# Patient Record
Sex: Female | Born: 1960 | Race: White | Hispanic: No | Marital: Married | Smoking: Never smoker
Health system: Southern US, Community
[De-identification: ages and names within clinical notes are randomized; demographics above are authoritative.]

## PROBLEM LIST (undated history)

## (undated) DIAGNOSIS — C50919 Malignant neoplasm of unspecified site of unspecified female breast: Secondary | ICD-10-CM

## (undated) DIAGNOSIS — J302 Other seasonal allergic rhinitis: Secondary | ICD-10-CM

## (undated) DIAGNOSIS — R112 Nausea with vomiting, unspecified: Secondary | ICD-10-CM

## (undated) DIAGNOSIS — J45909 Unspecified asthma, uncomplicated: Secondary | ICD-10-CM

## (undated) DIAGNOSIS — T7840XA Allergy, unspecified, initial encounter: Secondary | ICD-10-CM

## (undated) DIAGNOSIS — Z9889 Other specified postprocedural states: Secondary | ICD-10-CM

## (undated) DIAGNOSIS — Z923 Personal history of irradiation: Secondary | ICD-10-CM

## (undated) HISTORY — DX: Malignant neoplasm of unspecified site of unspecified female breast: C50.919

---

## 1989-12-30 HISTORY — PX: LAPAROSCOPIC ENDOMETRIOSIS FULGURATION: SUR769

## 1990-12-30 HISTORY — PX: ABDOMINAL HYSTERECTOMY: SHX81

## 2013-10-01 ENCOUNTER — Other Ambulatory Visit: Payer: Self-pay | Admitting: Radiology

## 2013-10-01 DIAGNOSIS — C50911 Malignant neoplasm of unspecified site of right female breast: Secondary | ICD-10-CM

## 2013-10-05 ENCOUNTER — Telehealth: Payer: Self-pay | Admitting: *Deleted

## 2013-10-05 DIAGNOSIS — C50311 Malignant neoplasm of lower-inner quadrant of right female breast: Secondary | ICD-10-CM | POA: Insufficient documentation

## 2013-10-05 NOTE — Telephone Encounter (Signed)
Confirmed [facility] for 10/06/13 at 1200 .  Instructions and contact information given.

## 2013-10-06 ENCOUNTER — Encounter: Payer: Self-pay | Admitting: Oncology

## 2013-10-06 ENCOUNTER — Ambulatory Visit: Payer: BC Managed Care – PPO

## 2013-10-06 ENCOUNTER — Ambulatory Visit
Admission: RE | Admit: 2013-10-06 | Discharge: 2013-10-06 | Disposition: A | Discharge status: Home / Self Care | Payer: BC Managed Care – PPO | Source: Ambulatory Visit | Attending: Radiation Oncology | Admitting: Radiation Oncology

## 2013-10-06 ENCOUNTER — Encounter: Payer: Self-pay | Admitting: *Deleted

## 2013-10-06 ENCOUNTER — Ambulatory Visit (HOSPITAL_BASED_OUTPATIENT_CLINIC_OR_DEPARTMENT_OTHER): Payer: BC Managed Care – PPO | Admitting: General Surgery

## 2013-10-06 ENCOUNTER — Other Ambulatory Visit (HOSPITAL_BASED_OUTPATIENT_CLINIC_OR_DEPARTMENT_OTHER): Payer: BC Managed Care – PPO | Admitting: Lab

## 2013-10-06 ENCOUNTER — Telehealth: Payer: Self-pay | Admitting: Oncology

## 2013-10-06 ENCOUNTER — Ambulatory Visit (HOSPITAL_BASED_OUTPATIENT_CLINIC_OR_DEPARTMENT_OTHER): Payer: BC Managed Care – PPO | Admitting: Oncology

## 2013-10-06 ENCOUNTER — Ambulatory Visit: Payer: BC Managed Care – PPO | Attending: General Surgery | Admitting: Physical Therapy

## 2013-10-06 ENCOUNTER — Encounter (INDEPENDENT_AMBULATORY_CARE_PROVIDER_SITE_OTHER): Payer: Self-pay | Admitting: General Surgery

## 2013-10-06 VITALS — BP 127/83 | HR 75 | Temp 97.8°F | Resp 19 | Wt 186.6 lb

## 2013-10-06 DIAGNOSIS — C50311 Malignant neoplasm of lower-inner quadrant of right female breast: Secondary | ICD-10-CM

## 2013-10-06 DIAGNOSIS — C50319 Malignant neoplasm of lower-inner quadrant of unspecified female breast: Secondary | ICD-10-CM

## 2013-10-06 DIAGNOSIS — IMO0001 Reserved for inherently not codable concepts without codable children: Secondary | ICD-10-CM | POA: Insufficient documentation

## 2013-10-06 DIAGNOSIS — C50919 Malignant neoplasm of unspecified site of unspecified female breast: Secondary | ICD-10-CM | POA: Insufficient documentation

## 2013-10-06 DIAGNOSIS — Z803 Family history of malignant neoplasm of breast: Secondary | ICD-10-CM

## 2013-10-06 DIAGNOSIS — R293 Abnormal posture: Secondary | ICD-10-CM | POA: Insufficient documentation

## 2013-10-06 DIAGNOSIS — Z171 Estrogen receptor negative status [ER-]: Secondary | ICD-10-CM

## 2013-10-06 LAB — COMPREHENSIVE METABOLIC PANEL (CC13)
ALT: 19 U/L (ref 0–55)
AST: 17 U/L (ref 5–34)
Albumin: 4.2 g/dL (ref 3.5–5.0)
Anion Gap: 8 mEq/L (ref 3–11)
BUN: 12 mg/dL (ref 7.0–26.0)
CO2: 28 mEq/L (ref 22–29)
Calcium: 9.7 mg/dL (ref 8.4–10.4)
Chloride: 105 mEq/L (ref 98–109)
Creatinine: 0.8 mg/dL (ref 0.6–1.1)
Potassium: 4.2 mEq/L (ref 3.5–5.1)
Total Bilirubin: 0.56 mg/dL (ref 0.20–1.20)
Total Protein: 7.3 g/dL (ref 6.4–8.3)

## 2013-10-06 LAB — CBC WITH DIFFERENTIAL/PLATELET
BASO%: 0.9 % (ref 0.0–2.0)
Basophils Absolute: 0 10*3/uL (ref 0.0–0.1)
Eosinophils Absolute: 0.1 10*3/uL (ref 0.0–0.5)
HCT: 41.2 % (ref 34.8–46.6)
HGB: 14.2 g/dL (ref 11.6–15.9)
LYMPH%: 27.1 % (ref 14.0–49.7)
MCHC: 34.4 g/dL (ref 31.5–36.0)
MONO#: 0.4 10*3/uL (ref 0.1–0.9)
NEUT#: 3 10*3/uL (ref 1.5–6.5)
NEUT%: 63 % (ref 38.4–76.8)
WBC: 4.8 10*3/uL (ref 3.9–10.3)
lymph#: 1.3 10*3/uL (ref 0.9–3.3)

## 2013-10-06 NOTE — Patient Instructions (Signed)
You have a relatively large tumor in the right breast at the medial, 3:00 position. Your lymph nodes looked normal.  You have an MRI scheduled for October 12. We will review that once it is completed  We have talked about different treatment plans, and you have stated that you would like to try to down stage the tumor with preoperative chemotherapy so that we can hopefully do a lumpectomy and sentinel node biopsy in the future.  Dr. Jacinto Halim office will call you to schedule Port-A-Cath insertion in the near future.  Dr. Welton Flakes will schedule you for genetic counseling as well as other scans.        Implanted Port Instructions An implanted port is a central line that has a round shape and is placed under the skin. It is used for long-term IV (intravenous) access for:  Medicine.  Fluids.  Liquid nutrition, such as TPN (total parenteral nutrition).  Blood samples. Ports can be placed:  In the chest area just below the collarbone (this is the most common place.)  In the arms.  In the belly (abdomen) area.  In the legs. PARTS OF THE PORT A port has 2 main parts:  The reservoir. The reservoir is round, disc-shaped, and will be a small, raised area under your skin.  The reservoir is the part where a needle is inserted (accessed) to either give medicines or to draw blood.  The catheter. The catheter is a long, slender tube that extends from the reservoir. The catheter is placed into a large vein.  Medicine that is inserted into the reservoir goes into the catheter and then into the vein. INSERTION OF THE PORT  The port is surgically placed in either an operating room or in a procedural area (interventional radiology).  Medicine may be given to help you relax during the procedure.  The skin where the port will be inserted is numbed (local anesthetic).  1 or 2 small cuts (incisions) will be made in the skin to insert the port.  The port can be used after it has been  inserted. INCISION SITE CARE  The incision site may have small adhesive strips on it. This helps keep the incision site closed. Sometimes, no adhesive strips are placed. Instead of adhesive strips, a special kind of surgical glue is used to keep the incision closed.  If adhesive strips were placed on the incision sites, do not take them off. They will fall off on their own.  The incision site may be sore for 1 to 2 days. Pain medicine can help.  Do not get the incision site wet. Bathe or shower as directed by your caregiver.  The incision site should heal in 5 to 7 days. A small scar may form after the incision has healed. ACCESSING THE PORT Special steps must be taken to access the port:  Before the port is accessed, a numbing cream can be placed on the skin. This helps numb the skin over the port site.  A sterile technique is used to access the port.  The port is accessed with a needle. Only "non-coring" port needles should be used to access the port. Once the port is accessed, a blood return should be checked. This helps ensure the port is in the vein and is not clogged (clotted).  If your caregiver believes your port should remain accessed, a clear (transparent) bandage will be placed over the needle site. The bandage and needle will need to be changed every week or as  directed by your caregiver.  Keep the bandage covering the needle clean and dry. Do not get it wet. Follow your caregiver's instructions on how to take a shower or bath when the port is accessed.  If your port does not need to stay accessed, no bandage is needed over the port. FLUSHING THE PORT Flushing the port keeps it from getting clogged. How often the port is flushed depends on:  If a constant infusion is running. If a constant infusion is running, the port may not need to be flushed.  If intermittent medicines are given.  If the port is not being used. For intermittent medicines:  The port will need to be  flushed:  After medicines have been given.  After blood has been drawn.  As part of routine maintenance.  A port is normally flushed with:  Normal saline.  Heparin.  Follow your caregiver's advice on how often, how much, and the type of flush to use on your port. IMPORTANT PORT INFORMATION  Tell your caregiver if you are allergic to heparin.  After your port is placed, you will get a manufacturer's information card. The card has information about your port. Keep this card with you at all times.  There are many types of ports available. Know what kind of port you have.  In case of an emergency, it may be helpful to wear a medical alert bracelet. This can help alert health care workers that you have a port.  The port can stay in for as long as your caregiver believes it is necessary.  When it is time for the port to come out, surgery will be done to remove it. The surgery will be similar to how the port was put in.  If you are in the hospital or clinic:  Your port will be taken care of and flushed by a nurse.  If you are at home:  A home health care nurse may give medicines and take care of the port.  You or a family member can get special training and directions for giving medicine and taking care of the port at home. SEEK IMMEDIATE MEDICAL CARE IF:   Your port does not flush or you are unable to get a blood return.  New drainage or pus is coming from the incision.  A bad smell is coming from the incision site.  You develop swelling or increased redness at the incision site.  You develop increased swelling or pain at the port site.  You develop swelling or pain in the surrounding skin near the port.  You have an oral temperature above 102 F (38.9 C), not controlled by medicine. MAKE SURE YOU:   Understand these instructions.  Will watch your condition.  Will get help right away if you are not doing well or get worse. Document Released: 12/16/2005 Document  Revised: 03/09/2012 Document Reviewed: 03/09/2009 [facility] Patient Information 2014 [place], [place].

## 2013-10-06 NOTE — Progress Notes (Signed)
Checked in new pt with no financial concerns. °

## 2013-10-06 NOTE — Progress Notes (Addendum)
Patient ID: Brittan Butterbaugh, female   DOB: 12-08-1961, 52 y.o.   MRN: [id number]  No chief complaint on file.   HPI Raivyn Kabler is a 52 y.o. female.  She is referred by Dr. 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 Dr. Marinda Elk is her primary care physician in [facility]. She is being evaluated in the [facility] today by Dr. Welton Flakes, Dr. Mitzi Hansen, and me.  She noticed a lump in her right breast about 2 months ago. She states her last mammogram was in December 2013 and nothing was seen. Mammograms showed a 3.5 cm suspicious mass in the right breast medially. Image guided biopsy shows invasive mammary carcinoma, probably invasive ductal carcinoma. This is afebrile negative breast cancer.  MRI is scheduled for October 12  She is interested in  breast conservation, if possible  Past history reveals that she has had abdominal hysterectomy and one ovary was removed. She had a history of endometriosis. Family history reveals breast cancer in a maternal grandmother and a paternal aunt but no ovarian cancer. She is here today with her husband. They have 2 grown children. She works in Insurance account manager  for [employer]  HPI  Past Medical History  Diagnosis Date  . Breast cancer     Past Surgical History  Procedure Laterality Date  . Abdominal hysterectomy      Family History  Problem Relation Age of Onset  . Lung cancer Maternal Uncle   . Breast cancer Paternal Aunt   . Breast cancer Maternal Grandmother   . Lung cancer Maternal Uncle     Social History History  Substance Use Topics  . Smoking status: Never Smoker   . Smokeless tobacco: Not on file  . Alcohol Use: Yes    Allergies  Allergen Reactions  . Penicillins     Current Outpatient Prescriptions  Medication Sig Dispense Refill  . aspirin 81 MG tablet Take 81 mg by mouth daily.      Marland Kitchen ibuprofen (ADVIL,MOTRIN) 200 MG tablet Take 200 mg by  mouth every 6 (six) hours as needed for pain.       No current facility-administered medications for this visit.    Review of Systems Review of Systems  Constitutional: Negative for fever, chills and unexpected weight change.  HENT: Negative for congestion, hearing loss, sore throat, trouble swallowing and voice change.   Eyes: Negative for visual disturbance.  Respiratory: Negative for cough and wheezing.   Cardiovascular: Negative for chest pain, palpitations and leg swelling.  Gastrointestinal: Negative for nausea, vomiting, abdominal pain, diarrhea, constipation, blood in stool, abdominal distention and anal bleeding.  Genitourinary: Negative for hematuria, vaginal bleeding and difficulty urinating.  Musculoskeletal: Negative for arthralgias.  Skin: Negative for rash and wound.  Neurological: Negative for seizures, syncope and headaches.  Hematological: Negative for adenopathy. Does not bruise/bleed easily.  Psychiatric/Behavioral: Negative for confusion.    There were no vitals taken for this visit.  Physical Exam Physical Exam  Constitutional: She is oriented to person, place, and time. She appears well-developed and well-nourished. No distress.  HENT:  Head: Normocephalic and atraumatic.  Nose: Nose normal.  Mouth/Throat: No oropharyngeal exudate.  Eyes: Conjunctivae and EOM are normal. Pupils are equal, round, and reactive to light. Left eye exhibits no discharge. No scleral icterus.  Neck: Neck supple. No JVD present. No tracheal deviation present. No thyromegaly present.  Cardiovascular: Normal rate, regular rhythm, normal  heart sounds and intact distal pulses.   No murmur heard. Pulmonary/Chest: Effort normal and breath sounds normal. No respiratory distress. She has no wheezes. She has no rales. She exhibits no tenderness.    Large mass, 3.5-4 cm, right breast, at approximately 2:30 to 3:00 position beginning at the  medial edge of the areola and extending medially.  Skin is intact. This does not appear to invade the skin. No axillary adenopathy. Left breast and axilla are normal to exam.  Abdominal: Soft. Bowel sounds are normal. She exhibits no distension and no mass. There is no tenderness. There is no rebound and no guarding.  Musculoskeletal: She exhibits no edema and no tenderness.  Lymphadenopathy:    She has no cervical adenopathy.  Neurological: She is alert and oriented to person, place, and time. She exhibits normal muscle tone. Coordination normal.  Skin: Skin is warm. No rash noted. She is not diaphoretic. No erythema. No pallor.  Psychiatric: She has a normal mood and affect. Her behavior is normal. Judgment and thought content normal.    Data Reviewed I have reviewed her imaging studies and histology in breast conference this morning. I have coordinated her treatment plan with Dr. Welton Flakes and Dr. 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 Clinical stage T2, N0   Patient strongly desires breast conservation surgery, and there's a she will need to undergo port preop neoadjuvant chemotherapy if she is to attempt to achieve this. She is comfortable with that  MRI pending    Plan    After lengthy discussion with Dr. Lennette Bihari, Dr. Mitzi Hansen, and me, she agrees to neoadjuvant chemotherapy  She will be scheduled for Port-A-Cath insertion. I discussed the indications, details, techniques, and numerous risk of port insertion with her. She is aware of the risk of bleeding, infection, catheter embolization, pneumothorax, catheter occlusion, and other unforeseen problems. She understands these issues and all her questions are answered.  She agrees with this plan.  Dr. Welton Flakes will refer her for genetic counseling  Dr. Welton Flakes will arrange for PET/CT and other imaging studies  MRI is scheduled for 10/10/2013. We'll review that as soon as the results are in.  She knows this may find other areas that may need biopsy.  ADDENDUM:  MRI shows 3.9 cm mass right breast medially. Solitary finding. No adenopathy.       Angelia Mould. Derrell Lolling, M.D., [facility] General and Minimally invasive Surgery Breast and Colorectal Surgery Office:   [phone] Pager:   [phone]  10/06/2013, 4:00 PM

## 2013-10-06 NOTE — Progress Notes (Signed)
Mekhia Brogan [id number] Feb 23, 1961 52 y.o. 10/06/2013 2:19 PM  CC  [employer], PA-C [address] [place] [address] Dr. Claud Kelp Dr. Dorothy Puffer  REASON FOR CONSULTATION:  52 year old female with new diagnosis of right breast cancer. Patient is seen in the multidisciplinary breast clinic for discussion of treatment options  STAGE:   Breast cancer of lower-inner quadrant of right female breast   Primary site: Breast (Right)   Staging method: AJCC 7th Edition   Clinical: Stage IIA (T2, N0, cM0)   Summary: Stage IIA (T2, N0, cM0)  REFERRING PHYSICIAN: Dr. Claud Kelp  HISTORY OF PRESENT ILLNESS:  Chazlyn Cude is a 51 y.o. female.  Without significant past medical history. Patient felt a mass in the right breast. Her last normal mammogram was in December 2000 third team. In September 2014 patient felt a mass in the right breast. She is to her primary care physician's attention. She had a mammogram performed that did reveal a suspicious mass at the 4:00 position was irregular and spiculated. On ultrasound measured 3.5 cm irregular with an indistinct margins in the right breast at 4:00 anterior depth. No other significant masses calcifications or other findings were noted in either breast her MRI is pending. Patient had a biopsy performed on 09/30/2013. 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 The tumor was estrogen receptor negative progesterone receptor negative negative with a Ki-67 80%. Her case was discussed at the multidisciplinary breast conference and she is now seen in the multidisciplinary breast clinic for discussion of treatment options. She is also seen by Dr. Claud Kelp Dr. Dorothy Puffer. She does have an MRI scheduled for Sunday, October 12. Other than pain she has no other complaints. She is accompanied by her husband.   Past Medical History: Past Medical History  Diagnosis Date  . Breast  cancer     Past Surgical History: Past Surgical History  Procedure Laterality Date  . Abdominal hysterectomy      Family History: Family History  Problem Relation Age of Onset  . Lung cancer Maternal Uncle   . Breast cancer Paternal Aunt   . Breast cancer Maternal Grandmother   . Lung cancer Maternal Uncle     Social History History  Substance Use Topics  . Smoking status: Never Smoker   . Smokeless tobacco: Not on file  . Alcohol Use: Yes    Allergies: Allergies  Allergen Reactions  . Penicillins     Current Medications: Current Outpatient Prescriptions  Medication Sig Dispense Refill  . aspirin 81 MG tablet Take 81 mg by mouth daily.      Marland Kitchen ibuprofen (ADVIL,MOTRIN) 200 MG tablet Take 200 mg by mouth every 6 (six) hours as needed for pain.       No current facility-administered medications for this visit.    OB/GYN History: Menarche at age 3 patient underwent surgical menopause in 1992 she has been on hormone replacement therapy she will now discontinue this. She has been using patches for 7 years. First live birth was at 48.  Fertility Discussion: Not applicable Prior History of Cancer: No  Health Maintenance:  Colonoscopy yes 2014 Bone Density 2012 Last PAP smear 2003rd team  ECOG PERFORMANCE STATUS: 0 - Asymptomatic  Genetic Counseling/testing: Patient will be referred to genetic counseling and testing for triple-negative disease  REVIEW OF SYSTEMS:  A comprehensive review of systems was negative.  PHYSICAL EXAMINATION: Blood pressure 127/83, pulse 75, temperature 97.8 F (36.6 C),  temperature source Oral, resp. rate 19, weight 186 lb 9.6 oz (84.641 kg).  ZOX:WRUEA, healthy, no distress, well nourished and well developed SKIN: skin color, texture, turgor are normal HEAD: Normocephalic EYES: PERRLA, EOMI, Conjunctiva are pink and non-injected EARS: External ears normal OROPHARYNX:no exudate, no erythema and lips, buccal mucosa, and tongue  normal  NECK: supple, no adenopathy, thyroid normal size, non-tender, without nodularity LYMPH:  no palpable lymphadenopathy, no hepatosplenomegaly BREAST:left breast normal without mass, skin or nipple changes or axillary nodes, abnormal mass palpable at the 4:00 position there is no palpable lymphadenopathy LUNGS: clear to auscultation and percussion HEART: regular rate & rhythm ABDOMEN:abdomen soft, non-tender, normal bowel sounds and no masses or organomegaly BACK: Back symmetric, no curvature., No CVA tenderness EXTREMITIES:no edema, no clubbing, no cyanosis  NEURO: alert & oriented x 3 with fluent speech, no focal motor/sensory deficits, gait normal, reflexes normal and symmetric     STUDIES/RESULTS: No results found.   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 The mass measures 3.5 cm by ultrasound. MRI is pending. Be prognostic markers revealed the tumor to be estrogen receptor negative progesterone receptor negative HER-2/neu negative with an elevated proliferation marker Ki-67 of 80%. Based on patient's size she has a stage II.  #2 patient and I discussed her pathology radiology and we discussed the physiology of breast cancer. Also discussed treatment options for triple negative breast cancer. This certainly would include surgery chemotherapy radiation therapy. We discussed role  of adjuvant neoadjuvant chemotherapy. I do think that the patient's tumor is beginning off that she would be a good candidate for preop chemotherapy and she does require breast conservation. I would recommend Adriamycin and Cytoxan given dose dense 4 cycles with [name] support. This was then followed Taxol carboplatinum weekly for 12 weeks. We discussed side effects risks benefits and rationale of chemotherapy.  #3 if patient does successfully undergo lumpectomy she will need radiation therapy and she was seen by Dr. Dorothy Puffer.  #5 because patient has a family history and is triple negative we are referring her to genetic testing.  #6 too high risk nature of her disease I have recommended initial staging studies including CT.  #7 chemotherapy patient will need a Port-A-Cath placed chemotherapy class echocardiogram. My hope is to start her on neoadjuvant treatment in the next few weeks once is placed.  Clinical Trial Eligibility: no Multidisciplinary conference discussion yes     PLAN:    #1 patient will proceed with MRI of the breasts.  #2 she will be seen by Dr. Derrell Lolling for placement of Port-A-Cath.  #3 we will set her up for genetic testing, chemotherapy class, echocardiogram, staging PET/CT,.  #4 hopefully can start her on chemotherapy at  the end of this month.       Discussion: Patient is being treated per NCCN breast cancer care guidelines appropriate for stage.II   Thank you so much for allowing me to participate in the care of [facility]. I will continue to follow up the patient with you and assist in her care.  All questions were answered. The patient knows to call the clinic with any problems, questions or concerns. We can certainly see the patient much sooner if necessary.  I spent 55 minutes counseling the patient face to face. The total time spent in the appointment was 60 minutes.  Drue Second, MD Medical/Oncology [facility] [phone]  (beeper) [phone] (Office)  10/06/2013, 2:19 PM

## 2013-10-07 ENCOUNTER — Telehealth: Payer: Self-pay | Admitting: Oncology

## 2013-10-07 ENCOUNTER — Encounter: Payer: Self-pay | Admitting: Specialist

## 2013-10-07 NOTE — Progress Notes (Signed)
I met the patient ane her husband at breast clinic.  She rated her distress as "5".  I encouraged her to attend Breast Cancer Support Group, and she did ask that I make a referral for her to the [facility].  I gave her my contact information should she need more assistance.

## 2013-10-07 NOTE — Telephone Encounter (Signed)
, °

## 2013-10-08 ENCOUNTER — Encounter (HOSPITAL_COMMUNITY): Payer: Self-pay | Admitting: Pharmacy Technician

## 2013-10-08 ENCOUNTER — Other Ambulatory Visit: Payer: Self-pay

## 2013-10-10 ENCOUNTER — Ambulatory Visit
Admission: RE | Admit: 2013-10-10 | Discharge: 2013-10-10 | Disposition: A | Discharge status: Home / Self Care | Payer: BC Managed Care – PPO | Source: Ambulatory Visit | Attending: Radiology | Admitting: Radiology

## 2013-10-10 DIAGNOSIS — C50911 Malignant neoplasm of unspecified site of right female breast: Secondary | ICD-10-CM

## 2013-10-10 MED ORDER — GADOBENATE DIMEGLUMINE 529 MG/ML IV SOLN
18.0000 mL | Freq: Once | INTRAVENOUS | Status: AC | PRN
  Administered 2013-10-10: 18 mL via INTRAVENOUS

## 2013-10-11 ENCOUNTER — Ambulatory Visit (HOSPITAL_COMMUNITY)
Admission: RE | Admit: 2013-10-11 | Discharge: 2013-10-11 | Disposition: A | Discharge status: Home / Self Care | Payer: BC Managed Care – PPO | Source: Ambulatory Visit | Attending: Oncology | Admitting: Oncology

## 2013-10-11 ENCOUNTER — Other Ambulatory Visit: Payer: BC Managed Care – PPO

## 2013-10-11 DIAGNOSIS — C50311 Malignant neoplasm of lower-inner quadrant of right female breast: Secondary | ICD-10-CM

## 2013-10-11 DIAGNOSIS — C50319 Malignant neoplasm of lower-inner quadrant of unspecified female breast: Secondary | ICD-10-CM | POA: Insufficient documentation

## 2013-10-11 DIAGNOSIS — Z01818 Encounter for other preprocedural examination: Secondary | ICD-10-CM | POA: Insufficient documentation

## 2013-10-11 DIAGNOSIS — Z0189 Encounter for other specified special examinations: Secondary | ICD-10-CM

## 2013-10-11 NOTE — Progress Notes (Signed)
Echocardiogram 2D Echocardiogram has been performed.  Natalie Arias 10/11/2013, 9:20 AM

## 2013-10-13 ENCOUNTER — Telehealth: Payer: Self-pay | Admitting: *Deleted

## 2013-10-13 ENCOUNTER — Encounter (HOSPITAL_COMMUNITY): Payer: Self-pay | Admitting: Pharmacy Technician

## 2013-10-13 NOTE — Progress Notes (Signed)
Radiation Oncology         [phone] ________________________________  Name: Natalie Arias MRN: [id number]  Date: 10/06/2013  DOB: [birth year]  WG:[id number],YQMV, PA-C  Ernestene Mention, MD   Drue Second, M.D.  REFERRING PHYSICIAN: Ernestene Mention, MD   DIAGNOSIS: The encounter diagnosis was Breast cancer of lower-inner quadrant of right female breast.   HISTORY OF PRESENT ILLNESS::Natalie Arias is a 52 y.o. female who is seen for an initial consultation visit. The patient had a palpable abnormality which he noticed within the right breast. A mammogram was completed in December of 2013 which was negative. She was set up by an additional mammogram more recently by her primary care physician. This showed a 3.5 cm suspicious mass within the right breast medially. 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 Receptor studies have been completed and the tumor is ER negative, PR negative, and HER-2/neu negative. The Ki-67 staining was 81st.  The patient's case was discussed in multidisciplinary breast conference this morning and she is seen in clinic this afternoon. The patient has an MRI scan which is pending.   PREVIOUS RADIATION THERAPY: No   PAST MEDICAL HISTORY:  has a past medical history of Breast cancer.     PAST SURGICAL HISTORY: Past Surgical History  Procedure Laterality Date  . Abdominal hysterectomy       FAMILY HISTORY: family history includes Breast cancer in her maternal grandmother and paternal aunt; Lung cancer in her maternal uncle and maternal uncle.   SOCIAL HISTORY:  reports that she has never smoked. She does not have any smokeless tobacco history on file. She reports that she drinks alcohol. She reports that she does not use illicit drugs.   ALLERGIES: Penicillins   MEDICATIONS:  Current Outpatient Prescriptions  Medication Sig Dispense Refill  . aspirin 81 MG  tablet Take 81 mg by mouth every morning.       Marland Kitchen ibuprofen (ADVIL,MOTRIN) 200 MG tablet Take 200 mg by mouth every 6 (six) hours as needed for pain.       No current facility-administered medications for this encounter.     REVIEW OF SYSTEMS:  A 15 point review of systems is documented in the electronic medical record. This was obtained by the nursing staff. However, I reviewed this with the patient to discuss relevant findings and make appropriate changes.  Pertinent items are noted in HPI.    PHYSICAL EXAM:  vitals were not taken for this visit.  General: Well-developed, in no acute distress HEENT: Normocephalic, atraumatic; oral cavity clear Neck: Supple without any lymphadenopathy Cardiovascular: Regular rate and rhythm Respiratory: Clear to auscultation bilaterally Breasts: For an approximate 3.5 cm mass is present medially within the right breast. This begins at the medial edge of the nipple area or complex. The skin is intact with no significant related changes. No axillary adenopathy. Left breast exam and axillary exam are unremarkable GI: Soft, nontender, normal bowel sounds Extremities: No edema present Neuro: No focal deficits     LABORATORY DATA:  Lab Results  Component Value Date   WBC 4.8 10/06/2013   HGB 14.2 10/06/2013   HCT 41.2 10/06/2013   MCV 87.4 10/06/2013   PLT 168 10/06/2013   Lab Results  Component Value Date   NA 142 10/06/2013   K 4.2 10/06/2013   CO2 28 10/06/2013   Lab Results  Component Value Date   ALT 19 10/06/2013  AST 17 10/06/2013   ALKPHOS 70 10/06/2013   BILITOT 0.56 10/06/2013      RADIOGRAPHY: Mr Breast Bilateral W Wo Contrast  10/11/2013   CLINICAL DATA:  Recently diagnosed right breast invasive mammary carcinoma. Preoperative evaluation.  EXAM: MR BILATERAL BREAST WITHOUT AND WITH CONTRAST  LABS:  None  TECHNIQUE: Multiplanar, multisequence MR images of both breasts were obtained prior to and following the intravenous administration of  18ml of MultiHance.  THREE-DIMENSIONAL MR IMAGE RENDERING ON INDEPENDENT WORKSTATION:  Three-dimensional MR images were rendered by post-processing of the original MR data on an independent workstation. The three-dimensional MR images were interpreted, and findings are reported in the following complete MRI report for this study.  COMPARISON:  Previous mammograms dated 09/30/2013, 09/29/2013.  FINDINGS: Breast composition: c:  Heterogeneous fibroglandular tissue  Background parenchymal enhancement: Moderate  Right breast: Within the right breast and there is an irregularly marginated enhancing mass with central necrosis located within the upper inner quadrant of the right breast at the 2 o'clock position (the middle 1/3). This measures 3.9 x 3.7 x 3.1 cm in size and is associated with clip artifact. There are no additional areas of worrisome enhancement within the right breast. .  Left breast: No mass or abnormal enhancement.  Lymph nodes: No abnormal appearing lymph nodes.  Ancillary findings:  None.  IMPRESSION: 3.9 cm irregular enhancing mass located within the upper inner quadrant of the right breast corresponding to the recently diagnosed invasive mammary carcinoma. No evidence for adenopathy and no additional findings.  RECOMMENDATION: Treatment PLAN  BI-RADS CATEGORY  6: Known biopsy-proven malignancy - appropriate action should be taken.   Electronically Signed   By: Anselmo Pickler M.D.   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 The tumor is a T2, N0, M0 tumor clinically, ER negative, PR negative, HER-2/neu negative.  The patient is an appropriate candidate to undergo breast conservation treatment. It was felt that she would benefit from neoadjuvant chemotherapy. This has been discussed with the patient in breast clinic this afternoon.  I therefore discussed with the patient the role of adjuvant  radiotherapy in this setting. We discussed the possible benefit in terms of improved local/regional control. We also discussed the possible side effects and risks of treatment as well. All of her questions were answered. She is aware that this plan will depend in part on her upcoming MRI scan.   PLAN: Tentatively, the patient will proceed with neoadjuvant chemotherapy followed by lumpectomy. I look forward to seeing the patient back in clinic postoperatively to review her case at that time and to proceed with an anticipated course of adjuvant radiotherapy.   I spent 60 minutes face to face with the patient and more than 50% of that time was spent in counseling and/or coordination of care.    ________________________________   Radene Gunning, MD, PhD

## 2013-10-13 NOTE — Telephone Encounter (Signed)
Spoke to patient concerning [facility] 10/06/13.  Patient denies any questions or concerns regarding her diagnosis or treatment plan.  I confirmed her schedule for port placement, CT/PET, F/U appointment with Dr. Welton Flakes and Genetic Counseling.  I verified that patient had contact information and encouraged her to call for any needs.  Patient verbalized understanding.

## 2013-10-14 ENCOUNTER — Ambulatory Visit (HOSPITAL_COMMUNITY)
Admission: RE | Admit: 2013-10-14 | Discharge: 2013-10-14 | Disposition: A | Discharge status: Home / Self Care | Payer: BC Managed Care – PPO | Source: Ambulatory Visit | Attending: General Surgery | Admitting: General Surgery

## 2013-10-14 ENCOUNTER — Encounter (HOSPITAL_COMMUNITY): Payer: Self-pay

## 2013-10-14 ENCOUNTER — Encounter (HOSPITAL_COMMUNITY)
Admission: RE | Admit: 2013-10-14 | Discharge: 2013-10-14 | Disposition: A | Discharge status: Home / Self Care | Payer: BC Managed Care – PPO | Source: Ambulatory Visit | Attending: General Surgery | Admitting: General Surgery

## 2013-10-14 DIAGNOSIS — Z01812 Encounter for preprocedural laboratory examination: Secondary | ICD-10-CM | POA: Insufficient documentation

## 2013-10-14 DIAGNOSIS — Z01818 Encounter for other preprocedural examination: Secondary | ICD-10-CM | POA: Insufficient documentation

## 2013-10-14 DIAGNOSIS — C50919 Malignant neoplasm of unspecified site of unspecified female breast: Secondary | ICD-10-CM | POA: Insufficient documentation

## 2013-10-14 HISTORY — DX: Unspecified asthma, uncomplicated: J45.909

## 2013-10-14 HISTORY — DX: Other specified postprocedural states: Z98.890

## 2013-10-14 HISTORY — DX: Nausea with vomiting, unspecified: R11.2

## 2013-10-14 HISTORY — DX: Other seasonal allergic rhinitis: J30.2

## 2013-10-14 LAB — COMPREHENSIVE METABOLIC PANEL
AST: 16 U/L (ref 0–37)
Albumin: 4.2 g/dL (ref 3.5–5.2)
Alkaline Phosphatase: 70 U/L (ref 39–117)
Calcium: 9.6 mg/dL (ref 8.4–10.5)
Creatinine, Ser: 0.71 mg/dL (ref 0.50–1.10)
GFR calc Af Amer: >90 mL/min (ref >90)

## 2013-10-14 LAB — CBC WITH DIFFERENTIAL/PLATELET
Basophils Absolute: 0 10*3/uL (ref 0.0–0.1)
Basophils Relative: 0 % (ref 0–1)
Eosinophils Relative: 2 % (ref 0–5)
HCT: 40.1 % (ref 36.0–46.0)
Hemoglobin: 13.9 g/dL (ref 12.0–15.0)
MCH: 30.3 pg (ref 26.0–34.0)
MCHC: 34.7 g/dL (ref 30.0–36.0)
MCV: 87.6 fL (ref 78.0–100.0)
Monocytes Absolute: 0.5 10*3/uL (ref 0.1–1.0)
Neutro Abs: 2.6 10*3/uL (ref 1.7–7.7)
Platelets: 156 10*3/uL (ref 150–400)
RDW: 13.1 % (ref 11.5–15.5)

## 2013-10-14 NOTE — Patient Instructions (Addendum)
[name]  10/14/2013   Your procedure is scheduled on: 10/18/13  Report to [facility] at 5:15 AM.  Call this number if you have problems the morning of surgery 336-: [phone]   Remember:   Do not eat food or drink liquids After Midnight.    Do not wear jewelry, make-up or nail polish.  Do not wear lotions, powders, or perfumes. You may wear deodorant.  Do not shave 48 hours prior to surgery. Men may shave face and neck.  Do not bring valuables to the hospital.   Patients discharged the day of surgery will not be allowed to drive home.  Name and phone number of your driver: Loistine Chance [phone]  Birdie Sons, RN  pre op nurse call if needed [phone]    FAILURE TO FOLLOW THESE INSTRUCTIONS MAY RESULT IN CANCELLATION OF YOUR SURGERY   Patient Signature: ___________________________________________

## 2013-10-15 NOTE — H&P (Signed)
Natalie Arias   MRN:  [id number]   Description: 52 year old female  Provider: Ernestene Mention, MD  Department: [facility]       Diagnoses    Breast cancer of lower-inner quadrant of right female breast    -  Primary    174.3         History and Physical   Ernestene Mention, MD    Status: Signed                            HPI Natalie Arias is a 52 y.o. female.  She is referred by Dr. Rosalva Arias at [facility] health care for evaluation and management of a locally advanced invasive cancer of the right breast at the 3:00 position. Dr. Marinda Elk is her primary care physician in [facility]. She is being evaluated in the [facility] today by Dr. Welton Flakes, Dr. Mitzi Hansen, and me.   She noticed a lump in her right breast about 2 months ago. She states her last mammogram was in December 2013 and nothing was seen. Mammograms showed a 3.5 cm suspicious mass in the right breast medially. Image guided biopsy shows invasive mammary carcinoma, probably invasive ductal carcinoma. This is afebrile negative breast cancer.   MRI is scheduled for October 12   She is interested in  breast conservation, if possible   Past history reveals that she has had abdominal hysterectomy and one ovary was removed. She had a history of endometriosis. Family history reveals breast cancer in a maternal grandmother and a paternal aunt but no ovarian cancer. She is here today with her husband. They have 2 grown children. She works in Insurance account manager  for [employer]        Past Medical History   Diagnosis  Date   .  Breast cancer           Past Surgical History   Procedure  Laterality  Date   .  Abdominal hysterectomy             Family History   Problem  Relation  Age of Onset   .  Lung cancer  Maternal Uncle     .  Breast cancer  Paternal Aunt     .  Breast cancer  Maternal Grandmother     .  Lung cancer  Maternal Uncle          Social History History   Substance Use Topics   .   Smoking status:  Never Smoker    .  Smokeless tobacco:  Not on file   .  Alcohol Use:  Yes         Allergies   Allergen  Reactions   .  Penicillins           Current Outpatient Prescriptions   Medication  Sig  Dispense  Refill   .  aspirin 81 MG tablet  Take 81 mg by mouth daily.         Marland Kitchen  ibuprofen (ADVIL,MOTRIN) 200 MG tablet  Take 200 mg by mouth every 6 (six) hours as needed for pain.                  Review of Systems   Constitutional: Negative for fever, chills and unexpected weight change.  HENT: Negative for congestion, hearing loss, sore throat, trouble swallowing and voice change.   Eyes: Negative for visual disturbance.  Respiratory:  Negative for cough and wheezing.   Cardiovascular: Negative for chest pain, palpitations and leg swelling.  Gastrointestinal: Negative for nausea, vomiting, abdominal pain, diarrhea, constipation, blood in stool, abdominal distention and anal bleeding.  Genitourinary: Negative for hematuria, vaginal bleeding and difficulty urinating.  Musculoskeletal: Negative for arthralgias.  Skin: Negative for rash and wound.  Neurological: Negative for seizures, syncope and headaches.  Hematological: Negative for adenopathy. Does not bruise/bleed easily.  Psychiatric/Behavioral: Negative for confusion.         Physical Exam   Constitutional: She is oriented to person, place, and time. She appears well-developed and well-nourished. No distress.  HENT:   Head: Normocephalic and atraumatic.   Nose: Nose normal.   Mouth/Throat: No oropharyngeal exudate.  Eyes: Conjunctivae and EOM are normal. Pupils are equal, round, and reactive to light. Left eye exhibits no discharge. No scleral icterus.  Neck: Neck supple. No JVD present. No tracheal deviation present. No thyromegaly present.  Cardiovascular: Normal rate, regular rhythm, normal heart sounds and intact distal pulses.    No murmur Arias. Pulmonary/Chest: Effort normal and breath  sounds normal. No respiratory distress. She has no wheezes. She has no rales. She exhibits no tenderness.    Large mass, 3.5-4 cm, right breast, at approximately 2:30 to 3:00 position beginning at the  medial edge of the areola and extending medially. Skin is intact. This does not appear to invade the skin. No axillary adenopathy. Left breast and axilla are normal to exam.  Abdominal: Soft. Bowel sounds are normal. She exhibits no distension and no mass. There is no tenderness. There is no rebound and no guarding.  Musculoskeletal: She exhibits no edema and no tenderness.  Lymphadenopathy:    She has no cervical adenopathy.  Neurological: She is alert and oriented to person, place, and time. She exhibits normal muscle tone. Coordination normal.  Skin: Skin is warm. No rash noted. She is not diaphoretic. No erythema. No pallor.  Psychiatric: She has a normal mood and affect. Her behavior is normal. Judgment and thought content normal.      Data Reviewed I have reviewed her imaging studies and histology in breast conference this morning. I have coordinated her treatment plan with Dr. Welton Flakes and Dr. Mitzi Hansen   Assessment    Invasive mammary carcinoma, possible invasive ductal carcinoma of right breast, medial, 3.5 cm, triple negative breast cancer. Clinical stage T2, N0    Patient strongly desires breast conservation surgery, and she will need to undergo port and preop neoadjuvant chemotherapy if she is to attempt to achieve this. She is comfortable with that   MRI pending     Plan    After lengthy discussion with Dr. Lennette Bihari, Dr. Mitzi Hansen, and me, she agrees to neoadjuvant chemotherapy   She will be scheduled for Port-A-Cath insertion. I discussed the indications, details, techniques, and numerous risk of port insertion with her. She is aware of the risk of bleeding, infection, catheter embolization, pneumothorax, catheter occlusion, and other unforeseen problems. She understands these issues  and all her questions are answered.  She agrees with this plan.   Dr. Welton Flakes will refer you for genetic counseling   Dr. Welton Flakes will arrange for PET/CT and other imaging studies   MRI is scheduled for 10/10/2013. We'll review that as soon as the results are in. She knows this may find other areas that may need biopsy.           Angelia Mould. Derrell Lolling, M.D., [facility] General and Minimally invasive  Surgery Breast and Colorectal Surgery Office:   [phone] Pager:   [phone]

## 2013-10-18 ENCOUNTER — Encounter (HOSPITAL_COMMUNITY): Payer: Self-pay | Admitting: *Deleted

## 2013-10-18 ENCOUNTER — Ambulatory Visit (HOSPITAL_COMMUNITY): Payer: BC Managed Care – PPO

## 2013-10-18 ENCOUNTER — Encounter (HOSPITAL_COMMUNITY)
Admission: RE | Disposition: A | Discharge status: Home / Self Care | Payer: Self-pay | Source: Ambulatory Visit | Attending: General Surgery

## 2013-10-18 ENCOUNTER — Ambulatory Visit (HOSPITAL_COMMUNITY): Payer: BC Managed Care – PPO | Admitting: Certified Registered Nurse Anesthetist

## 2013-10-18 ENCOUNTER — Ambulatory Visit (HOSPITAL_COMMUNITY)
Admission: RE | Admit: 2013-10-18 | Discharge: 2013-10-18 | Disposition: A | Discharge status: Home / Self Care | Payer: BC Managed Care – PPO | Source: Ambulatory Visit | Attending: General Surgery | Admitting: General Surgery

## 2013-10-18 ENCOUNTER — Encounter (HOSPITAL_COMMUNITY): Payer: BC Managed Care – PPO | Admitting: Certified Registered Nurse Anesthetist

## 2013-10-18 DIAGNOSIS — C50319 Malignant neoplasm of lower-inner quadrant of unspecified female breast: Secondary | ICD-10-CM | POA: Insufficient documentation

## 2013-10-18 DIAGNOSIS — C50311 Malignant neoplasm of lower-inner quadrant of right female breast: Secondary | ICD-10-CM | POA: Diagnosis present

## 2013-10-18 HISTORY — PX: PORTACATH PLACEMENT: SHX2246

## 2013-10-18 SURGERY — INSERTION, TUNNELED CENTRAL VENOUS DEVICE, WITH PORT
Anesthesia: General | Site: Chest | Wound class: Clean

## 2013-10-18 MED ORDER — PROPOFOL 10 MG/ML IV BOLUS
INTRAVENOUS | Status: DC | PRN
  Administered 2013-10-18: 175 mg via INTRAVENOUS

## 2013-10-18 MED ORDER — DEXAMETHASONE SODIUM PHOSPHATE 10 MG/ML IJ SOLN
INTRAMUSCULAR | Status: DC | PRN
  Administered 2013-10-18: 10 mg via INTRAVENOUS

## 2013-10-18 MED ORDER — HEPARIN SOD (PORK) LOCK FLUSH 100 UNIT/ML IV SOLN
INTRAVENOUS | Status: DC | PRN
  Administered 2013-10-18: 500 [IU] via INTRAVENOUS

## 2013-10-18 MED ORDER — OXYCODONE HCL 5 MG PO TABS: 5.0000 mg | ORAL_TABLET | ORAL | Status: DC | PRN

## 2013-10-18 MED ORDER — BUPIVACAINE-EPINEPHRINE PF 0.5-1:200000 % IJ SOLN
INTRAMUSCULAR | Status: DC | PRN
  Administered 2013-10-18: 15 mL

## 2013-10-18 MED ORDER — ONDANSETRON HCL 4 MG/2ML IJ SOLN
INTRAMUSCULAR | Status: DC | PRN
  Administered 2013-10-18 (×2): 2 mg via INTRAMUSCULAR

## 2013-10-18 MED ORDER — HEPARIN SOD (PORK) LOCK FLUSH 100 UNIT/ML IV SOLN
INTRAVENOUS | Status: AC
  Filled 2013-10-18: qty 5

## 2013-10-18 MED ORDER — EPHEDRINE SULFATE 50 MG/ML IJ SOLN
INTRAMUSCULAR | Status: DC | PRN
  Administered 2013-10-18 (×2): 5 mg via INTRAVENOUS

## 2013-10-18 MED ORDER — HYDROMORPHONE HCL PF 1 MG/ML IJ SOLN
INTRAMUSCULAR | Status: AC
  Filled 2013-10-18: qty 1

## 2013-10-18 MED ORDER — MORPHINE SULFATE 10 MG/ML IJ SOLN: 2.0000 mg | INTRAMUSCULAR | Status: DC | PRN

## 2013-10-18 MED ORDER — VANCOMYCIN HCL IN DEXTROSE 1-5 GM/200ML-% IV SOLN
1000.0000 mg | INTRAVENOUS | Status: AC
  Administered 2013-10-18: 1000 mg via INTRAVENOUS

## 2013-10-18 MED ORDER — METOCLOPRAMIDE HCL 5 MG/ML IJ SOLN
INTRAMUSCULAR | Status: DC | PRN
  Administered 2013-10-18: 5 mg via INTRAVENOUS

## 2013-10-18 MED ORDER — LACTATED RINGERS IV SOLN
INTRAVENOUS | Status: DC | PRN
  Administered 2013-10-18 (×2): via INTRAVENOUS

## 2013-10-18 MED ORDER — SODIUM CHLORIDE 0.9 % IR SOLN
Freq: Once | Status: AC
  Administered 2013-10-18: 09:00:00
  Filled 2013-10-18: qty 1.2

## 2013-10-18 MED ORDER — LIDOCAINE HCL (CARDIAC) 20 MG/ML IV SOLN
INTRAVENOUS | Status: DC | PRN
  Administered 2013-10-18: 75 mg via INTRAVENOUS

## 2013-10-18 MED ORDER — MEPERIDINE HCL 50 MG/ML IJ SOLN: 6.2500 mg | INTRAMUSCULAR | Status: DC | PRN

## 2013-10-18 MED ORDER — HYDROCODONE-ACETAMINOPHEN 5-325 MG PO TABS: 1.0000 | ORAL_TABLET | ORAL | Status: DC | PRN

## 2013-10-18 MED ORDER — ONDANSETRON HCL 4 MG/2ML IJ SOLN: 4.0000 mg | Freq: Four times a day (QID) | INTRAMUSCULAR | Status: DC | PRN

## 2013-10-18 MED ORDER — FENTANYL CITRATE 0.05 MG/ML IJ SOLN
INTRAMUSCULAR | Status: DC | PRN
  Administered 2013-10-18 (×3): 50 ug via INTRAVENOUS

## 2013-10-18 MED ORDER — LIDOCAINE HCL 1 % IJ SOLN
INTRAMUSCULAR | Status: AC
  Filled 2013-10-18: qty 20

## 2013-10-18 MED ORDER — PROMETHAZINE HCL 25 MG/ML IJ SOLN: 6.2500 mg | INTRAMUSCULAR | Status: DC | PRN

## 2013-10-18 MED ORDER — HYDROMORPHONE HCL PF 1 MG/ML IJ SOLN
0.2500 mg | INTRAMUSCULAR | Status: DC | PRN
  Administered 2013-10-18 (×2): 0.5 mg via INTRAVENOUS

## 2013-10-18 MED ORDER — BUPIVACAINE-EPINEPHRINE 0.5% -1:200000 IJ SOLN
INTRAMUSCULAR | Status: AC
  Filled 2013-10-18: qty 1

## 2013-10-18 MED ORDER — ACETAMINOPHEN 650 MG RE SUPP
650.0000 mg | RECTAL | Status: DC | PRN
  Filled 2013-10-18: qty 1

## 2013-10-18 MED ORDER — OXYCODONE HCL 5 MG/5ML PO SOLN
5.0000 mg | Freq: Once | ORAL | Status: DC | PRN
  Filled 2013-10-18: qty 5

## 2013-10-18 MED ORDER — MIDAZOLAM HCL 5 MG/5ML IJ SOLN
INTRAMUSCULAR | Status: DC | PRN
  Administered 2013-10-18 (×2): 1 mg via INTRAVENOUS

## 2013-10-18 MED ORDER — OXYCODONE HCL 5 MG PO TABS: 5.0000 mg | ORAL_TABLET | Freq: Once | ORAL | Status: DC | PRN

## 2013-10-18 MED ORDER — VANCOMYCIN HCL IN DEXTROSE 1-5 GM/200ML-% IV SOLN
INTRAVENOUS | Status: AC
  Filled 2013-10-18: qty 200

## 2013-10-18 MED ORDER — ACETAMINOPHEN 325 MG PO TABS: 650.0000 mg | ORAL_TABLET | ORAL | Status: DC | PRN

## 2013-10-18 SURGICAL SUPPLY — 43 items
BAG DECANTER FOR FLEXI CONT (MISCELLANEOUS) ×2 IMPLANT
BENZOIN TINCTURE PRP APPL 2/3 (GAUZE/BANDAGES/DRESSINGS) ×2 IMPLANT
BLADE HEX COATED 2.75 (ELECTRODE) ×2 IMPLANT
BLADE SURG SZ10 CARB STEEL (BLADE) ×2 IMPLANT
CLOTH BEACON ORANGE TIMEOUT ST (SAFETY) ×2 IMPLANT
DECANTER SPIKE VIAL GLASS SM (MISCELLANEOUS) ×2 IMPLANT
DERMABOND ADVANCED (GAUZE/BANDAGES/DRESSINGS) ×1
DERMABOND ADVANCED .7 DNX12 (GAUZE/BANDAGES/DRESSINGS) ×1 IMPLANT
DRAPE C-ARM 42X120 X-RAY (DRAPES) ×2 IMPLANT
DRAPE LAPAROSCOPIC ABDOMINAL (DRAPES) ×2 IMPLANT
DRSG TEGADERM 6X8 (GAUZE/BANDAGES/DRESSINGS) ×2 IMPLANT
ELECT REM PT RETURN 9FT ADLT (ELECTROSURGICAL) ×2
ELECTRODE REM PT RTRN 9FT ADLT (ELECTROSURGICAL) ×1 IMPLANT
GAUZE SPONGE 2X2 8PLY STRL LF (GAUZE/BANDAGES/DRESSINGS) ×1 IMPLANT
GAUZE SPONGE 4X4 16PLY XRAY LF (GAUZE/BANDAGES/DRESSINGS) ×2 IMPLANT
GLOVE BIOGEL PI IND STRL 7.0 (GLOVE) ×1 IMPLANT
GLOVE BIOGEL PI INDICATOR 7.0 (GLOVE) ×1
GLOVE EUDERMIC 7 POWDERFREE (GLOVE) ×4 IMPLANT
GOWN PREVENTION PLUS LG XLONG (DISPOSABLE) ×4 IMPLANT
GOWN STRL REIN XL XLG (GOWN DISPOSABLE) ×4 IMPLANT
KIT BARDPORT ISP (Port) IMPLANT
KIT BARDPORT ISP 9.6FR (PORTABLE EQUIPMENT SUPPLIES) IMPLANT
KIT BASIN OR (CUSTOM PROCEDURE TRAY) ×2 IMPLANT
KIT PORT POWER 8FR ISP CVUE (Catheter) ×2 IMPLANT
MARKER SKIN DUAL TIP RULER LAB (MISCELLANEOUS) ×2 IMPLANT
NEEDLE HYPO 22GX1.5 SAFETY (NEEDLE) ×2 IMPLANT
NEEDLE HYPO 25X1 1.5 SAFETY (NEEDLE) ×2 IMPLANT
NS IRRIG 1000ML POUR BTL (IV SOLUTION) ×2 IMPLANT
PACK BASIC VI WITH GOWN DISP (CUSTOM PROCEDURE TRAY) ×2 IMPLANT
PENCIL BUTTON HOLSTER BLD 10FT (ELECTRODE) ×2 IMPLANT
SPONGE GAUZE 2X2 STER 10/PKG (GAUZE/BANDAGES/DRESSINGS) ×1
SPONGE GAUZE 4X4 12PLY (GAUZE/BANDAGES/DRESSINGS) ×4 IMPLANT
STRIP CLOSURE SKIN 1/2X4 (GAUZE/BANDAGES/DRESSINGS) ×2 IMPLANT
SUT MNCRL AB 4-0 PS2 18 (SUTURE) ×2 IMPLANT
SUT PROLENE 2 0 SH DA (SUTURE) ×4 IMPLANT
SUT SILK 2 0 (SUTURE) ×1
SUT SILK 2-0 18XBRD TIE 12 (SUTURE) ×1 IMPLANT
SUT VIC AB 3-0 SH 27 (SUTURE) ×2
SUT VIC AB 3-0 SH 27XBRD (SUTURE) ×2 IMPLANT
SYR BULB IRRIGATION 50ML (SYRINGE) ×2 IMPLANT
SYR CONTROL 10ML LL (SYRINGE) ×2 IMPLANT
SYRINGE 10CC LL (SYRINGE) ×2 IMPLANT
TOWEL OR 17X26 10 PK STRL BLUE (TOWEL DISPOSABLE) ×2 IMPLANT

## 2013-10-18 NOTE — Interval H&P Note (Signed)
History and Physical Interval Note:  10/18/2013 7:19 AM  Natalie Arias  has presented today for surgery, with the diagnosis of cancer right breast  The goals and the various methods of treatment have been discussed with the patient and family. After consideration of risks, benefits and other options for treatment, the patient has consented to  Procedure(s): INSERTION PORT-A-CATH WITH ULTRASOUND (N/A) as a surgical intervention .  The patient's history has been reviewed, patient examined, no change in status, stable for surgery.  I have reviewed the patient's chart and labs.  Questions were answered to the patient's satisfaction.     Ernestene Mention

## 2013-10-18 NOTE — Transfer of Care (Signed)
Immediate Anesthesia Transfer of Care Note  Patient: Natalie Arias  Procedure(s) Performed: Procedure(s): INSERTION PORT-A-CATH WITH ULTRASOUND (N/A)  Patient Location: PACU  Anesthesia Type:General  Level of Consciousness: awake, oriented, patient cooperative, lethargic and responds to stimulation  Airway & Oxygen Therapy: Patient Spontanous Breathing and Patient connected to face mask oxygen  Post-op Assessment: Report given to PACU RN, Post -op Vital signs reviewed and stable and Patient moving all extremities  Post vital signs: Reviewed and stable  Complications: No apparent anesthesia complications

## 2013-10-18 NOTE — Preoperative (Signed)
Beta Blockers   Reason not to administer Beta Blockers:Not Applicable 

## 2013-10-18 NOTE — Op Note (Signed)
Patient Name:           Natalie Arias   Date of Surgery:        10/18/2013  Pre op Diagnosis:      Invasive cancer of the medial quadrant of the right breast  Post op Diagnosis:    same  Procedure:                 Insertion of 8 [place], ClearVue power port tunneled vascular access device, utilization of fluoroscopy for guidance and positioning  Surgeon:                     Angelia Mould. Derrell Lolling, M.D., FACS  Assistant:                      none  Operative Indications:   Natalie Arias is a 52 y.o. female. She is referred by Dr. Rosalva Ferron at [facility] health care for evaluation and management of a locally advanced invasive cancer of the right breast at the 3:00 position. Dr. Marinda Elk is her primary care physician in [facility]. She was evaluated in the [facility] recently by Dr. Welton Flakes, Dr. Mitzi Hansen, and me.  She noticed a lump in her right breast about 2 months ago. She states her last mammogram was in December 2013 and nothing was seen. Mammograms showed a 3.5 cm suspicious mass in the right breast medially. Image guided biopsy shows invasive mammary carcinoma, probably invasive ductal carcinoma. This is afebrile negative breast cancer.  MRI shows that this is a solitary mass. She is interested in breast conservation, if possible neoadjuvant chemotherapy is planned and she is brought to the operating room electively for placement of a Port-A-Cath. Past history reveals that she has had abdominal hysterectomy and one ovary was removed. She had a history of endometriosis. Family history reveals breast cancer in a maternal grandmother and a paternal aunt but no ovarian cancer.     Operative Findings:       The port was inserted through the left subclavian vein. The catheter initially went across the midline into the right subclavian vein but we were able to reposition this in the distal superior vena cava under fluoroscopic guidance. We had excellent blood return and it flushed easily.  Procedure in Detail:           Following the induction of a general LMA anesthesia the patient was positioned with a small roll behind her shoulders and arms tucked at her sides. The neck and chest were prepped and draped in a sterile fashion. Surgical time out was performed. Intravenous antibiotics were given. 0.5% Marcaine with epinephrine was used as a local infiltration anesthetic. A left subclavian venipuncture was performed without difficulty and a guidewire inserted. A guidewire was seen to be in the superior vena cava just above the right atrium. A template was marked on the chest wall to allow proper catheter length to get the tip of the catheter in the vena cava near the right atrium. Markings were made. A transverse incision was made in the left  infraclavicular area somewhat medially. Subcutaneous tissue was debrided. A subcutaneous pocket was created at the level of the pectoralis fascia. . Using a tunneling device I pulled the catheter from the port pocket site to the wire insertion site. I cut the catheter an appropriate length after measuring it on the  template and this was 26 cm. The catheter was secured to the port with a locking  device and the port and catheter flushed with heparinized saline. The port was sutured to the pectoralis fascia with 3 sutures of 2-0 Prolene. The dilator and peel-away sheath were inserted over the guidewire into the central venous circulation. The guidewire and dilator were removed. The catheter threaded easily and the peel-away sheath was removed. The catheter flushed easily but I could not get blood return. Fluoroscopy confirmed that the catheter had migrated across the midline into the right subclavian vein. Under fluoroscopic guidance I pulled the catheter back and then repositioned it down into the vena cava and it looked good under fluoroscopy. At this point it flushed easily and had excellent blood return. I flushed the port and catheter with concentrated heparin. Subcutaneous tissues  were closed with 3-0 Vicryl sutures and the skin was closed with subcuticular sutures of 4-0 Monocryl and Dermabond. The patient tolerated the procedure well. She'll be taken to recovery in stable condition. A chest x-ray will be obtained. EBL 15 cc. Complications none. Counts correct.     Angelia Mould. Derrell Lolling, M.D., FACS General and Minimally Invasive Surgery Breast and Colorectal Surgery  10/18/2013 8:59 AM

## 2013-10-18 NOTE — Anesthesia Postprocedure Evaluation (Signed)
Anesthesia Post Note  Patient: Natalie Arias  Procedure(s) Performed: Procedure(s) (LRB): INSERTION PORT-A-CATH WITH ULTRASOUND (N/A)  Anesthesia type: General  Patient location: PACU  Post pain: Pain level controlled  Post assessment: Post-op Vital signs reviewed  Last Vitals: BP 112/74  Pulse 65  Temp(Src) 36.6 C (Oral)  Resp 16  SpO2 99%  Post vital signs: Reviewed  Level of consciousness: sedated  Complications: No apparent anesthesia complications

## 2013-10-18 NOTE — Anesthesia Preprocedure Evaluation (Signed)
Anesthesia Evaluation  Patient identified by MRN, date of birth, ID band Patient awake    Reviewed: Allergy & Precautions, H&P , NPO status , Patient's Chart, lab work & pertinent test results  History of Anesthesia Complications (+) PONV and history of anesthetic complications  Airway       Dental  (+) Dental Advisory Given   Pulmonary neg pulmonary ROS,          Cardiovascular negative cardio ROS      Neuro/Psych negative neurological ROS  negative psych ROS   GI/Hepatic negative GI ROS, Neg liver ROS,   Endo/Other  negative endocrine ROS  Renal/GU negative Renal ROS     Musculoskeletal negative musculoskeletal ROS (+)   Abdominal   Peds  Hematology negative hematology ROS (+)   Anesthesia Other Findings   Reproductive/Obstetrics negative OB ROS                           Anesthesia Physical Anesthesia Plan  ASA: II  Anesthesia Plan: General   Post-op Pain Management:    Induction: Intravenous  Airway Management Planned: LMA  Additional Equipment:   Intra-op Plan:   Post-operative Plan: Extubation in OR  Informed Consent: I have reviewed the patients History and Physical, chart, labs and discussed the procedure including the risks, benefits and alternatives for the proposed anesthesia with the patient or authorized representative who has indicated his/her understanding and acceptance.   Dental advisory given  Plan Discussed with: CRNA  Anesthesia Plan Comments:         Anesthesia Quick Evaluation

## 2013-10-19 ENCOUNTER — Encounter (HOSPITAL_COMMUNITY): Payer: Self-pay | Admitting: General Surgery

## 2013-10-19 ENCOUNTER — Encounter: Payer: Self-pay | Admitting: Oncology

## 2013-10-19 ENCOUNTER — Telehealth: Payer: Self-pay | Admitting: *Deleted

## 2013-10-19 NOTE — Telephone Encounter (Signed)
Per staff message and POF I have scheduled appts.  JMW  

## 2013-10-20 ENCOUNTER — Telehealth: Payer: Self-pay | Admitting: *Deleted

## 2013-10-20 DIAGNOSIS — C50311 Malignant neoplasm of lower-inner quadrant of right female breast: Secondary | ICD-10-CM

## 2013-10-20 MED ORDER — LIDOCAINE-PRILOCAINE 2.5-2.5 % EX CREA: TOPICAL_CREAM | CUTANEOUS | Status: DC | PRN

## 2013-10-20 NOTE — Telephone Encounter (Signed)
Patient sent MyChart message asking for numbing cream for port-a-cath for her first A/C chemotherapy on 10-27-2013.  Emla cream sent to pharmacy.  Also asked if there is a pre-medicine she needs or wo=ill everything be given afterwards because I have nothing ordered yet for medications.  Will notify providers.

## 2013-10-21 ENCOUNTER — Other Ambulatory Visit: Payer: Self-pay | Admitting: Emergency Medicine

## 2013-10-21 ENCOUNTER — Ambulatory Visit (HOSPITAL_COMMUNITY)
Admission: RE | Admit: 2013-10-21 | Discharge: 2013-10-21 | Disposition: A | Discharge status: Home / Self Care | Payer: BC Managed Care – PPO | Source: Ambulatory Visit | Attending: Oncology | Admitting: Oncology

## 2013-10-21 ENCOUNTER — Encounter (HOSPITAL_COMMUNITY): Payer: Self-pay

## 2013-10-21 ENCOUNTER — Encounter (HOSPITAL_COMMUNITY)
Admission: RE | Admit: 2013-10-21 | Discharge: 2013-10-21 | Disposition: A | Discharge status: Home / Self Care | Payer: BC Managed Care – PPO | Source: Ambulatory Visit | Attending: Oncology | Admitting: Oncology

## 2013-10-21 DIAGNOSIS — C50311 Malignant neoplasm of lower-inner quadrant of right female breast: Secondary | ICD-10-CM

## 2013-10-21 DIAGNOSIS — Z171 Estrogen receptor negative status [ER-]: Secondary | ICD-10-CM | POA: Insufficient documentation

## 2013-10-21 DIAGNOSIS — C50919 Malignant neoplasm of unspecified site of unspecified female breast: Secondary | ICD-10-CM | POA: Insufficient documentation

## 2013-10-21 DIAGNOSIS — C50319 Malignant neoplasm of lower-inner quadrant of unspecified female breast: Secondary | ICD-10-CM | POA: Insufficient documentation

## 2013-10-21 LAB — GLUCOSE, CAPILLARY: Glucose-Capillary: 90 mg/dL (ref 70–99)

## 2013-10-21 MED ORDER — IOHEXOL 300 MG/ML  SOLN
100.0000 mL | Freq: Once | INTRAMUSCULAR | Status: AC | PRN
  Administered 2013-10-21: 100 mL via INTRAVENOUS

## 2013-10-21 MED ORDER — FLUDEOXYGLUCOSE F - 18 (FDG) INJECTION
16.7000 | Freq: Once | INTRAVENOUS | Status: AC | PRN
  Administered 2013-10-21: 16.7 via INTRAVENOUS

## 2013-10-21 NOTE — Telephone Encounter (Signed)
She needs her pre-meds  thanks

## 2013-10-26 ENCOUNTER — Ambulatory Visit: Payer: BC Managed Care – PPO | Admitting: Oncology

## 2013-10-26 ENCOUNTER — Other Ambulatory Visit: Payer: Self-pay | Admitting: Emergency Medicine

## 2013-10-26 DIAGNOSIS — C50311 Malignant neoplasm of lower-inner quadrant of right female breast: Secondary | ICD-10-CM

## 2013-10-27 ENCOUNTER — Other Ambulatory Visit (HOSPITAL_BASED_OUTPATIENT_CLINIC_OR_DEPARTMENT_OTHER): Payer: BC Managed Care – PPO | Admitting: Lab

## 2013-10-27 ENCOUNTER — Encounter: Payer: Self-pay | Admitting: Family

## 2013-10-27 ENCOUNTER — Ambulatory Visit (HOSPITAL_BASED_OUTPATIENT_CLINIC_OR_DEPARTMENT_OTHER): Payer: BC Managed Care – PPO | Admitting: Family

## 2013-10-27 ENCOUNTER — Telehealth: Payer: Self-pay | Admitting: Oncology

## 2013-10-27 ENCOUNTER — Ambulatory Visit: Payer: BC Managed Care – PPO | Admitting: Oncology

## 2013-10-27 ENCOUNTER — Ambulatory Visit (HOSPITAL_BASED_OUTPATIENT_CLINIC_OR_DEPARTMENT_OTHER): Payer: BC Managed Care – PPO

## 2013-10-27 VITALS — BP 129/85 | HR 88 | Temp 98.6°F | Resp 18 | Ht 66.0 in | Wt 191.7 lb

## 2013-10-27 DIAGNOSIS — C50319 Malignant neoplasm of lower-inner quadrant of unspecified female breast: Secondary | ICD-10-CM

## 2013-10-27 DIAGNOSIS — C50311 Malignant neoplasm of lower-inner quadrant of right female breast: Secondary | ICD-10-CM

## 2013-10-27 DIAGNOSIS — Z171 Estrogen receptor negative status [ER-]: Secondary | ICD-10-CM

## 2013-10-27 DIAGNOSIS — Z5111 Encounter for antineoplastic chemotherapy: Secondary | ICD-10-CM

## 2013-10-27 LAB — CBC WITH DIFFERENTIAL/PLATELET
Eosinophils Absolute: 0.3 10*3/uL (ref 0.0–0.5)
HCT: 39.5 % (ref 34.8–46.6)
LYMPH%: 23.9 % (ref 14.0–49.7)
MCH: 29.8 pg (ref 25.1–34.0)
MONO#: 0.5 10*3/uL (ref 0.1–0.9)
NEUT#: 2.8 10*3/uL (ref 1.5–6.5)
NEUT%: 57.9 % (ref 38.4–76.8)
Platelets: 129 10*3/uL — ABNORMAL LOW (ref 145–400)
RBC: 4.49 10*6/uL (ref 3.70–5.45)
WBC: 4.7 10*3/uL (ref 3.9–10.3)

## 2013-10-27 LAB — COMPREHENSIVE METABOLIC PANEL (CC13)
AST: 17 U/L (ref 5–34)
Anion Gap: 8 mEq/L (ref 3–11)
CO2: 26 mEq/L (ref 22–29)
Creatinine: 0.7 mg/dL (ref 0.6–1.1)
Glucose: 71 mg/dl (ref 70–140)
Total Bilirubin: 0.46 mg/dL (ref 0.20–1.20)

## 2013-10-27 MED ORDER — DEXAMETHASONE SODIUM PHOSPHATE 20 MG/5ML IJ SOLN
12.0000 mg | Freq: Once | INTRAMUSCULAR | Status: AC
  Administered 2013-10-27: 12 mg via INTRAVENOUS

## 2013-10-27 MED ORDER — PALONOSETRON HCL INJECTION 0.25 MG/5ML
0.2500 mg | Freq: Once | INTRAVENOUS | Status: AC
  Administered 2013-10-27: 0.25 mg via INTRAVENOUS

## 2013-10-27 MED ORDER — LORAZEPAM 0.5 MG PO TABS: 0.5000 mg | ORAL_TABLET | Freq: Four times a day (QID) | ORAL | Status: DC | PRN

## 2013-10-27 MED ORDER — ONDANSETRON HCL 8 MG PO TABS: 8.0000 mg | ORAL_TABLET | Freq: Two times a day (BID) | ORAL | Status: DC | PRN

## 2013-10-27 MED ORDER — SODIUM CHLORIDE 0.9 % IV SOLN
150.0000 mg | Freq: Once | INTRAVENOUS | Status: AC
  Administered 2013-10-27: 150 mg via INTRAVENOUS
  Filled 2013-10-27: qty 5

## 2013-10-27 MED ORDER — SODIUM CHLORIDE 0.9 % IV SOLN
Freq: Once | INTRAVENOUS | Status: AC
  Administered 2013-10-27: 11:00:00 via INTRAVENOUS

## 2013-10-27 MED ORDER — SODIUM CHLORIDE 0.9 % IV SOLN
600.0000 mg/m2 | Freq: Once | INTRAVENOUS | Status: AC
  Administered 2013-10-27: 1200 mg via INTRAVENOUS
  Filled 2013-10-27: qty 60

## 2013-10-27 MED ORDER — DOXORUBICIN HCL CHEMO IV INJECTION 2 MG/ML
60.0000 mg/m2 | Freq: Once | INTRAVENOUS | Status: AC
  Administered 2013-10-27: 120 mg via INTRAVENOUS
  Filled 2013-10-27: qty 60

## 2013-10-27 MED ORDER — DEXAMETHASONE SODIUM PHOSPHATE 20 MG/5ML IJ SOLN
INTRAMUSCULAR | Status: AC
  Filled 2013-10-27: qty 5

## 2013-10-27 MED ORDER — PALONOSETRON HCL INJECTION 0.25 MG/5ML
INTRAVENOUS | Status: AC
  Filled 2013-10-27: qty 5

## 2013-10-27 MED ORDER — DEXAMETHASONE 4 MG PO TABS: ORAL_TABLET | ORAL | Status: DC

## 2013-10-27 MED ORDER — PROCHLORPERAZINE MALEATE 10 MG PO TABS: 10.0000 mg | ORAL_TABLET | Freq: Four times a day (QID) | ORAL | Status: DC | PRN

## 2013-10-27 MED ORDER — SODIUM CHLORIDE 0.9 % IJ SOLN
10.0000 mL | INTRAMUSCULAR | Status: DC | PRN
  Administered 2013-10-27: 10 mL
  Filled 2013-10-27: qty 10

## 2013-10-27 MED ORDER — HEPARIN SOD (PORK) LOCK FLUSH 100 UNIT/ML IV SOLN
500.0000 [IU] | Freq: Once | INTRAVENOUS | Status: AC | PRN
  Administered 2013-10-27: 500 [IU]
  Filled 2013-10-27: qty 5

## 2013-10-27 NOTE — Patient Instructions (Addendum)
[facility] Discharge Instructions for Patients Receiving Chemotherapy  Today you received the following chemotherapy agents: adriamycin, cytoxan  To help prevent nausea and vomiting after your treatment, we encourage you to take your nausea medication.  Take it as often as prescribed.     If you develop nausea and vomiting that is not controlled by your nausea medication, call the clinic. If it is after clinic hours your family physician or the after hours number for the clinic or go to the Emergency Department.   BELOW ARE SYMPTOMS THAT SHOULD BE REPORTED IMMEDIATELY:  *FEVER GREATER THAN 100.5 F  *CHILLS WITH OR WITHOUT FEVER  NAUSEA AND VOMITING THAT IS NOT CONTROLLED WITH YOUR NAUSEA MEDICATION  *UNUSUAL SHORTNESS OF BREATH  *UNUSUAL BRUISING OR BLEEDING  TENDERNESS IN MOUTH AND THROAT WITH OR WITHOUT PRESENCE OF ULCERS  *URINARY PROBLEMS  *BOWEL PROBLEMS  UNUSUAL RASH Items with * indicate a potential emergency and should be followed up as soon as possible.  One of the nurses will contact you 24 hours after your treatment. Please let the nurse know about any problems that you may have experienced. Feel free to call the clinic you have any questions or concerns. The clinic phone number is [phone].   I have been informed and understand all the instructions given to me. I know to contact the clinic, my physician, or go to the Emergency Department if any problems should occur. I do not have any questions at this time, but understand that I may call the clinic during office hours   should I have any questions or need assistance in obtaining follow up care.    __________________________________________  _____________  __________ Signature of Patient or Authorized Representative            Date                   Time    __________________________________________ Nurse's Signature    Doxorubicin injection (adriamycin) What is this  medicine? DOXORUBICIN (dox oh ROO bi sin) is a chemotherapy drug. It is used to treat many kinds of cancer like Hodgkin's disease, leukemia, non-Hodgkin's lymphoma, neuroblastoma, sarcoma, and Wilms' tumor. It is also used to treat bladder cancer, breast cancer, lung cancer, ovarian cancer, stomach cancer, and thyroid cancer. This medicine may be used for other purposes; ask your health care provider or pharmacist if you have questions. What should I tell my health care provider before I take this medicine? They need to know if you have any of these conditions: -blood disorders -heart disease, recent heart attack -infection (especially a virus infection such as chickenpox, cold sores, or herpes) -irregular heartbeat -liver disease -recent or ongoing radiation therapy -an unusual or allergic reaction to doxorubicin, other chemotherapy agents, other medicines, foods, dyes, or preservatives -pregnant or trying to get pregnant -breast-feeding How should I use this medicine? This drug is given as an infusion into a vein. It is administered in a hospital or clinic by a specially trained health care professional. If you have pain, swelling, burning or any unusual feeling around the site of your injection, tell your health care professional right away. Talk to your pediatrician regarding the use of this medicine in children. Special care may be needed. Overdosage: If you think you have taken too much of this medicine contact a poison control center or emergency room at once. NOTE: This medicine is only for you. Do not share this medicine with others. What if I miss a  dose? It is important not to miss your dose. Call your doctor or health care professional if you are unable to keep an appointment. What may interact with this medicine? Do not take this medicine with any of the following medications: -cisapride -droperidol -halofantrine -pimozide -zidovudine This medicine may also interact with the  following medications: -chloroquine -chlorpromazine -clarithromycin -cyclophosphamide -cyclosporine -erythromycin -medicines for depression, anxiety, or psychotic disturbances -medicines for irregular heart beat like amiodarone, bepridil, dofetilide, encainide, flecainide, propafenone, quinidine -medicines for seizures like ethotoin, fosphenytoin, phenytoin -medicines for nausea, vomiting like dolasetron, ondansetron, palonosetron -medicines to increase blood counts like filgrastim, pegfilgrastim, sargramostim -methadone -methotrexate -pentamidine -progesterone -vaccines -verapamil Talk to your doctor or health care professional before taking any of these medicines: -acetaminophen -aspirin -ibuprofen -ketoprofen -naproxen This list may not describe all possible interactions. Give your health care provider a list of all the medicines, herbs, non-prescription drugs, or dietary supplements you use. Also tell them if you smoke, drink alcohol, or use illegal drugs. Some items may interact with your medicine. What should I watch for while using this medicine? Your condition will be monitored carefully while you are receiving this medicine. You will need important blood work done while you are taking this medicine. This drug may make you feel generally unwell. This is not uncommon, as chemotherapy can affect healthy cells as well as cancer cells. Report any side effects. Continue your course of treatment even though you feel ill unless your doctor tells you to stop. Your urine may turn red for a few days after your dose. This is not blood. If your urine is dark or brown, call your doctor. In some cases, you may be given additional medicines to help with side effects. Follow all directions for their use. Call your doctor or health care professional for advice if you get a fever, chills or sore throat, or other symptoms of a cold or flu. Do not treat yourself. This drug decreases your body's  ability to fight infections. Try to avoid being around people who are sick. This medicine may increase your risk to bruise or bleed. Call your doctor or health care professional if you notice any unusual bleeding. Be careful brushing and flossing your teeth or using a toothpick because you may get an infection or bleed more easily. If you have any dental work done, tell your dentist you are receiving this medicine. Avoid taking products that contain aspirin, acetaminophen, ibuprofen, naproxen, or ketoprofen unless instructed by your doctor. These medicines may hide a fever. Men and women of childbearing age should use effective birth control methods while using taking this medicine. Do not become pregnant while taking this medicine. There is a potential for serious side effects to an unborn child. Talk to your health care professional or pharmacist for more information. Do not breast-feed an infant while taking this medicine. Do not let others touch your urine or other body fluids for 5 days after each treatment with this medicine. Caregivers should wear latex gloves to avoid touching body fluids during this time. What side effects may I notice from receiving this medicine? Side effects that you should report to your doctor or health care professional as soon as possible: -allergic reactions like skin rash, itching or hives, swelling of the face, lips, or tongue -low blood counts - this medicine may decrease the number of white blood cells, red blood cells and platelets. You may be at increased risk for infections and bleeding. -signs of infection - fever or chills, cough,  sore throat, pain or difficulty passing urine -signs of decreased platelets or bleeding - bruising, pinpoint red spots on the skin, black, tarry stools, blood in the urine -signs of decreased red blood cells - unusually weak or tired, fainting spells, lightheadedness -breathing problems -chest pain -fast, irregular heartbeat -mouth  sores -nausea, vomiting -pain, swelling, redness at site where injected -pain, tingling, numbness in the hands or feet -swelling of ankles, feet, or hands -unusual bleeding or bruising Side effects that usually do not require medical attention (report to your doctor or health care professional if they continue or are bothersome): -diarrhea -facial flushing -hair loss -loss of appetite -missed menstrual periods -nail discoloration or damage -red or watery eyes -red colored urine -stomach upset This list may not describe all possible side effects. Call your doctor for medical advice about side effects. You may report side effects to FDA at 1-800-FDA-1088. Where should I keep my medicine? This drug is given in a hospital or clinic and will not be stored at home. NOTE: This sheet is a summary. It may not cover all possible information. If you have questions about this medicine, talk to your doctor, pharmacist, or health care provider.  2012, [employer]. (04/05/2008 5:07:32 PM)  Cyclophosphamide injection (cytoxan) What is this medicine? CYCLOPHOSPHAMIDE (sye kloe FOSS fa mide) is a chemotherapy drug. It slows the growth of cancer cells. This medicine is used to treat many types of cancer like lymphoma, myeloma, leukemia, breast cancer, and ovarian cancer, to name a few. It is also used to treat nephrotic syndrome in children. This medicine may be used for other purposes; ask your health care provider or pharmacist if you have questions. What should I tell my health care provider before I take this medicine? They need to know if you have any of these conditions: -blood disorders -history of other chemotherapy -history of radiation therapy -infection -kidney disease -liver disease -tumors in the bone marrow -an unusual or allergic reaction to cyclophosphamide, other chemotherapy, other medicines, foods, dyes, or preservatives -pregnant or trying to get  pregnant -breast-feeding How should I use this medicine? This drug is usually given as an injection into a vein or muscle or by infusion into a vein. It is administered in a hospital or clinic by a specially trained health care professional. Talk to your pediatrician regarding the use of this medicine in children. While this drug may be prescribed for selected conditions, precautions do apply. Overdosage: If you think you have taken too much of this medicine contact a poison control center or emergency room at once. NOTE: This medicine is only for you. Do not share this medicine with others. What if I miss a dose? It is important not to miss your dose. Call your doctor or health care professional if you are unable to keep an appointment. What may interact with this medicine? Do not take this medicine with any of the following medications: -mibefradil -nalidixic acid This medicine may also interact with the following medications: -doxorubicin -etanercept -medicines to increase blood counts like filgrastim, pegfilgrastim, sargramostim -medicines that block muscle or nerve pain -St. John's Wort -phenobarbital -succinylcholine chloride -trastuzumab -vaccines Talk to your doctor or health care professional before taking any of these medicines: -acetaminophen -aspirin -ibuprofen -ketoprofen -naproxen This list may not describe all possible interactions. Give your health care provider a list of all the medicines, herbs, non-prescription drugs, or dietary supplements you use. Also tell them if you smoke, drink alcohol, or use illegal drugs. Some items may  interact with your medicine. What should I watch for while using this medicine? Visit your doctor for checks on your progress. This drug may make you feel generally unwell. This is not uncommon, as chemotherapy can affect healthy cells as well as cancer cells. Report any side effects. Continue your course of treatment even though you feel ill  unless your doctor tells you to stop. Drink water or other fluids as directed. Urinate often, even at night. In some cases, you may be given additional medicines to help with side effects. Follow all directions for their use. Call your doctor or health care professional for advice if you get a fever, chills or sore throat, or other symptoms of a cold or flu. Do not treat yourself. This drug decreases your body's ability to fight infections. Try to avoid being around people who are sick. This medicine may increase your risk to bruise or bleed. Call your doctor or health care professional if you notice any unusual bleeding. Be careful brushing and flossing your teeth or using a toothpick because you may get an infection or bleed more easily. If you have any dental work done, tell your dentist you are receiving this medicine. Avoid taking products that contain aspirin, acetaminophen, ibuprofen, naproxen, or ketoprofen unless instructed by your doctor. These medicines may hide a fever. Do not become pregnant while taking this medicine. Women should inform their doctor if they wish to become pregnant or think they might be pregnant. There is a potential for serious side effects to an unborn child. Talk to your health care professional or pharmacist for more information. Do not breast-feed an infant while taking this medicine. Men should inform their doctor if they wish to father a child. This medicine may lower sperm counts. If you are going to have surgery, tell your doctor or health care professional that you have taken this medicine. What side effects may I notice from receiving this medicine? Side effects that you should report to your doctor or health care professional as soon as possible: -allergic reactions like skin rash, itching or hives, swelling of the face, lips, or tongue -low blood counts - this medicine may decrease the number of white blood cells, red blood cells and platelets. You may be at  increased risk for infections and bleeding. -signs of infection - fever or chills, cough, sore throat, pain or difficulty passing urine -signs of decreased platelets or bleeding - bruising, pinpoint red spots on the skin, black, tarry stools, blood in the urine -signs of decreased red blood cells - unusually weak or tired, fainting spells, lightheadedness -breathing problems -dark urine -mouth sores -pain, swelling, redness at site where injected -swelling of the ankles, feet, hands -trouble passing urine or change in the amount of urine -weight gain -yellowing of the eyes or skin Side effects that usually do not require medical attention (report to your doctor or health care professional if they continue or are bothersome): -changes in nail or skin color -diarrhea -hair loss -loss of appetite -missed menstrual periods -nausea, vomiting -stomach pain This list may not describe all possible side effects. Call your doctor for medical advice about side effects. You may report side effects to FDA at 1-800-FDA-1088. Where should I keep my medicine? This drug is given in a hospital or clinic and will not be stored at home. NOTE: This sheet is a summary. It may not cover all possible information. If you have questions about this medicine, talk to your doctor, pharmacist, or health care  provider.  2012, [employer]. (03/22/2008 2:32:25 PM)  Pegfilgrastim injection ([name]) What is this medicine? PEGFILGRASTIM (peg fil GRA stim) helps the body make more white blood cells. It is used to prevent infection in people with low amounts of white blood cells following cancer treatment. This medicine may be used for other purposes; ask your health care provider or pharmacist if you have questions. What should I tell my health care provider before I take this medicine? They need to know if you have any of these conditions: -sickle cell disease -an unusual or allergic reaction to  pegfilgrastim, filgrastim, E.coli protein, other medicines, foods, dyes, or preservatives -pregnant or trying to get pregnant -breast-feeding How should I use this medicine? This medicine is for injection under the skin. It is usually given by a health care professional in a hospital or clinic setting. If you get this medicine at home, you will be taught how to prepare and give this medicine. Do not shake this medicine. Use exactly as directed. Take your medicine at regular intervals. Do not take your medicine more often than directed. It is important that you put your used needles and syringes in a special sharps container. Do not put them in a trash can. If you do not have a sharps container, call your pharmacist or healthcare provider to get one. Talk to your pediatrician regarding the use of this medicine in children. While this drug may be prescribed for children who weigh more than 45 kg for selected conditions, precautions do apply Overdosage: If you think you have taken too much of this medicine contact a poison control center or emergency room at once. NOTE: This medicine is only for you. Do not share this medicine with others. What if I miss a dose? If you miss a dose, take it as soon as you can. If it is almost time for your next dose, take only that dose. Do not take double or extra doses. What may interact with this medicine? -lithium -medicines for growth therapy This list may not describe all possible interactions. Give your health care provider a list of all the medicines, herbs, non-prescription drugs, or dietary supplements you use. Also tell them if you smoke, drink alcohol, or use illegal drugs. Some items may interact with your medicine. What should I watch for while using this medicine? Visit your doctor for regular check ups. You will need important blood work done while you are taking this medicine. What side effects may I notice from receiving this medicine? Side effects  that you should report to your doctor or health care professional as soon as possible: -allergic reactions like skin rash, itching or hives, swelling of the face, lips, or tongue -breathing problems -fever -pain, redness, or swelling where injected -shoulder pain -stomach or side pain Side effects that usually do not require medical attention (report to your doctor or health care professional if they continue or are bothersome): -aches, pains -headache -loss of appetite -nausea, vomiting -unusually tired This list may not describe all possible side effects. Call your doctor for medical advice about side effects. You may report side effects to FDA at 1-800-FDA-1088. Where should I keep my medicine? Keep out of the reach of children. Store in a refrigerator between 2 and 8 degrees C (36 and 46 degrees F). Do not freeze. Keep in carton to protect from light. Throw away this medicine if it is left out of the refrigerator for more than 48 hours. Throw away any unused medicine after  the expiration date. NOTE: This sheet is a summary. It may not cover all possible information. If you have questions about this medicine, talk to your doctor, pharmacist, or health care provider.  2013, [employer]. (07/18/2008 3:41:44 PM)

## 2013-10-27 NOTE — Progress Notes (Addendum)
[facility]  Telephone:([phone] [phone]) [phone]  OFFICE PROGRESS NOTE  ID: Natalie Arias   DOB: 1961-06-11  MR#: [id number]  JYN#:[id number]   PCP: Natalie Kim, PA-C SU: Claud Kelp, MD RAD ONC:  Dorothy Puffer, MD   DIAGNOSES: Natalie Arias is a 52 y.o. 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   STAGE:  Breast cancer of lower-inner quadrant of right female breast   Primary site: Breast (Right)   Staging method: AJCC 7th Edition   Clinical: Stage IIA (T2, N0, cM0)   Summary: Stage IIA (T2, N0, cM0)   HISTORY OF PRESENT ILLNESS: Natalie Arias is a 52 y.o. female without significant past medical history.  Patient felt a mass in the right breast. Her last normal mammogram was in 11/2012.  In September 2014 patient felt a mass in the right breast. She brought a mass to the attention of her primary care physician.  A mammogram performed revealed a suspicious mass at the 4 o'clock position that was irregular and spiculated. On ultrasound the right breast mass measured 3.5 cm and was irregular with indistinct margins at 4 o'clock position anterior depth. No other significant masses, calcifications, or other findings were noted in either breast. A right breast needle core biopsy performed on 09/30/2013 resulted in pathology that revealed an invasive mammary carcinoma likely ductal phenotype, intermediate grade with a tumor that is estrogen receptor negative, progesterone receptor negative, HER-2/neu negative, with a Ki-67 80%. Bilateral breast MRI on 10/11/2013 that showed within the right breast there was an irregularly marginated enhancing mass with central necrosis located in the upper-inner quadrant at the 2:00 position (the middle 1/3).  The mass measures 3.9 x 3.7 x 3.1 cm in size and was associated with clip artifact.  There were no additional  areas of worrisome enhancement within the right breast, the left breast did not have mass or abnormal enhancement.  The lymph nodes showed no abnormal appearing lymph nodes (clinical stage IIA T2 N0).     PRIOR THERAPY: #1 Right breast needle core biopsy performed on 09/30/2013 resulting in pathology that revealed an invasive mammary carcinoma likely ductal phenotype, intermediate grade, with a tumor that is estrogen receptor negative, progesterone receptor negative, HER-2/neu negative, with a Ki-67 80%.  #2 Bilateral breast MRI on 10/11/2013 showed within the right breast there was an irregularly marginated enhancing mass with central necrosis located in the upper-inner quadrant at the 2 o'clock position (the middle 1/3).  The mass measures 3.9 x 3.7 x 3.1 cm in size and was associated with clip artifact.  There were no additional areas of worrisome enhancement within the right breast.  The left breast did not have mass or abnormal enhancement.  No abnormal appearing lymph nodes (clinical stage IIA T2 N0).     CURRENT THERAPY:  #1 Presents today to start neoadjuvant chemotherapy day #1 cycle #1 of dose dense Adriamycin/Cytoxan with 4 planned doses and day 2 granulocyte support with [name] injection.  This is scheduled to be followed by neoadjuvant chemotherapy consisting of Taxol/carboplatin weekly x12 weeks.   INTERVAL HISTORY: Dr. Welton Flakes and I saw Natalie Arias today for followup of triple negative invasive ductal carcinoma of the right breast.  She is accompanied for today's office visit by her husband Natalie Arias.  Since her last office visit on 10/06/2013 her interval history is significant for attending chemotherapy class, having a left chest Port-A-Cath  placed by Dr. Derrell Lolling on 10/06/2013, and having a 2-D echocardiogram completed on 10/11/2013.  She presents today to proceed with neoadjuvant chemotherapy day 1, cycle 1 of 4 consisting of dose dense Adriamycin/Cytoxan.  Chemotherapy-related  medications including Dexamethasone, Lorazepam, Compazine, Zofran, and [name] were reviewed in detail with dosing instructions with the patient and her husband.  Common chemotherapy side effects and symptoms to report were also reviewed in detail with the patient and her husband.  They both voiced understanding.  Her interval history is otherwise unremarkable.   PAST MEDICAL HISTORY: Past Medical History  Diagnosis Date  . Asthma 20's    allergic asthema  . Seasonal allergies   . PONV (postoperative nausea and vomiting)   . Breast cancer dx'd 10/01/2013    right    PAST SURGICAL HISTORY: Past Surgical History  Procedure Laterality Date  . Laparoscopic endometriosis fulguration  1991  . Abdominal hysterectomy  1992  . Portacath placement N/A 10/18/2013    Procedure: INSERTION PORT-A-CATH WITH ULTRASOUND;  Surgeon: Ernestene Mention, MD;  Location: [facility];  Service: General;  Laterality: N/A;    FAMILY HISTORY: Family History  Problem Relation Age of Onset  . Lung cancer Maternal Uncle   . Breast cancer Paternal Aunt   . Breast cancer Maternal Grandmother   . Lung cancer Maternal Uncle   . Hypertension Father   . Heart disease Father   . Parkinsonism Father   . Cancer Father     Skin Cancer  . Hypercholesterolemia Mother   . Gallstones Mother   . Healthy Brother     SOCIAL HISTORY: History  Substance Use Topics  . Smoking status: Never Smoker   . Smokeless tobacco: Never Used  . Alcohol Use: Yes     Comment: once a month    ALLERGIES: Allergies  Allergen Reactions  . Penicillins Hives    CURRENT MEDICATIONS: Current Outpatient Prescriptions  Medication Sig Dispense Refill  . aspirin 81 MG tablet Take 81 mg by mouth every morning.       Marland Kitchen ibuprofen (ADVIL,MOTRIN) 200 MG tablet Take 200 mg by mouth every 6 (six) hours as needed for pain.      Marland Kitchen lidocaine-prilocaine (EMLA) cream Apply topically as needed. To port-a-cath 1.5 hours before chemotherapy and cover   30 g  1  . UNABLE TO FIND 1 each by Other route as needed. Cranial Prosthesis      . dexamethasone (DECADRON) 4 MG tablet Take 2 tablets by mouth once a day on the day after chemotherapy and then take 2 tablets two times a day for 2 days. Take with food.  30 tablet  1  . HYDROcodone-acetaminophen (NORCO/VICODIN) 5-325 MG per tablet Take 1-2 tablets by mouth every 4 (four) hours as needed for pain.  30 tablet  0  . LORazepam (ATIVAN) 0.5 MG tablet Take 1 tablet (0.5 mg total) by mouth every 6 (six) hours as needed (Nausea or vomiting).  30 tablet  0  . ondansetron (ZOFRAN) 8 MG tablet Take 1 tablet (8 mg total) by mouth 2 (two) times daily as needed. Take two times a day as needed for nausea or vomiting starting on the third day after chemotherapy.  30 tablet  1  . prochlorperazine (COMPAZINE) 10 MG tablet Take 1 tablet (10 mg total) by mouth every 6 (six) hours as needed (Nausea or vomiting).  30 tablet  1   No current facility-administered medications for this visit.   Facility-Administered Medications Ordered in Other Visits  Medication Dose Route Frequency Provider Last Rate Last Dose  . sodium chloride 0.9 % injection 10 mL  10 mL Intracatheter PRN Victorino December, MD   10 mL at 10/27/13 1240    OB/GYN HISTORY: Menarche at age 9 patient underwent surgical menopause in 1992 she has been on hormone replacement therapy she will now discontinue this. She has been using patches for 7 years. First live birth was at 36.   HEALTH MAINTENANCE: Colonoscopy:   yes 2014 Bone Density:   2012 Last PAP smear:   2013   GENETIC COUNSELING/TESTING: She is scheduled to be seen by Genetic Counselor Maylon Cos, MS, CGC on 01/06/2014.   REVIEW OF SYSTEMS: A comprehensive review of systems was negative.   A 10 point review of systems was completed and is negative.  Natalie Arias specifically denies any symptomatology including fatigue, fever or chills, headache, vision changes, swollen glands, cough or  shortness of breath, chest pain or discomfort, nausea, vomiting, diarrhea, constipation, change in urinary or bowel habits, arthralgias/myalgias, unusual bleeding/bruising or any other symptomatology.   PHYSICAL EXAMINATION: Blood pressure 129/85, pulse 88, temperature 98.6 F (37 C), temperature source Oral, resp. rate 18, height 5\' 6"  (1.676 m), weight 191 lb 11.2 oz (86.955 kg).  ECOG FS: 0 - Asymptomatic  General appearance: Alert, cooperative, well nourished, no apparent distress Head: Normocephalic, without obvious abnormality, atraumatic Eyes: Conjunctivae/corneas clear, PERRLA, EOMI Nose: Nares, septum and mucosa are normal, no drainage or sinus tenderness Neck: No adenopathy, supple, symmetrical, trachea midline, no tenderness Resp: Clear to auscultation bilaterally, no wheezes/rales/rhonchi Cardio: Regular rate and rhythm, S1, S2 normal, no murmur, click, rub or gallop, no edema, left chest Port-A-Cath covered an EMLA cream with an occlusive dressing Breasts:  Deferred GI: Soft, not distended, non-tender, hypoactive bowel sounds, no organomegaly Skin: No rashes/lesions, skin warm and dry, no erythematous areas, no cyanosis  M/S:  Atraumatic, normal strength in all extremities, normal range of motion, no clubbing  Lymph nodes: Cervical, supraclavicular, and axillary nodes normal Neurologic: Grossly normal, cranial nerves II through XII intact, alert and oriented x 3 Psych: Appropriate affect    LABORATORIES:   Chemistry      Component Value Date/Time   NA 145 10/27/2013 0806   NA 140 10/14/2013 0930   K 4.2 10/27/2013 0806   K 4.7 10/14/2013 0930   CL 105 10/14/2013 0930   CO2 26 10/27/2013 0806   CO2 29 10/14/2013 0930   BUN 9.8 10/27/2013 0806   BUN 12 10/14/2013 0930   CREATININE 0.7 10/27/2013 0806   CREATININE 0.71 10/14/2013 0930      Component Value Date/Time   CALCIUM 9.9 10/27/2013 0806   CALCIUM 9.6 10/14/2013 0930   ALKPHOS 67 10/27/2013 0806    ALKPHOS 70 10/14/2013 0930   AST 17 10/27/2013 0806   AST 16 10/14/2013 0930   ALT 19 10/27/2013 0806   ALT 18 10/14/2013 0930   BILITOT 0.46 10/27/2013 0806   BILITOT 0.3 10/14/2013 0930      Lab Results  Component Value Date   WBC 4.7 10/27/2013   HGB 13.4 10/27/2013   HCT 39.5 10/27/2013   MCV 88.0 10/27/2013   PLT 129* 10/27/2013    STUDIES/IMAGING: 1.  2-D echocardiogram on 10/11/2013 showed a left ventricular ejection fraction of 60% - 65%.    ASSESSMENT: Natalie Arias is a 52 y.o. female diagnosed with triple negative invasive ductal carcinoma of the right breast in 09/2013:  #1 Intermediate grade invasive ductal carcinoma  of the right breast. The mass measures 3.5 cm by ultrasound. Prognostic markers revealed the tumor to be estrogen receptor negative, progesterone receptor negative, HER-2/neu negative, with an elevated proliferation marker Ki-67 of 80%.   #2  Bilateral breast MRI on 10/11/2013 showed within the right breast there was an irregularly marginated enhancing mass with central necrosis located in the upper-inner quadrant at the 2 o'clock position (the middle 1/3).  The mass measures 3.9 x 3.7 x 3.1 cm in size and was associated with clip artifact.  There were no additional areas of worrisome enhancement within the right breast.  The left breast did not have mass or abnormal enhancement.  No abnormal appearing lymph nodes (clinical stage IIA T2 N0).    #3  To start neoadjuvant chemotherapy day #1 cycle #1 of dose dense Adriamycin/Cytoxan with 4 planned doses on 10/27/2013.  Day 2 granulocyte support with [name] injection.  This is scheduled to be followed by neoadjuvant chemotherapy consisting of Taxol/Carboplatin weekly x12 weeks.  #4  If patient does successfully undergo lumpectomy she will need radiation therapy.  #5 Patient has a family history of breast cancer and herself has triple negative breast cancer and is scheduled to be seen by the genetic counselor  on 01/06/2014.   PLAN: #1 Natalie Arias will proceed with chemotherapy cycle #1 of 4 consisting of dose dense Adriamycin/Cytoxan today.  [name] injection is scheduled for tomorrow 10/28/2013.  We plan to see her again on 11/03/2013 for a nadir office visit/physical assessment/toxicity evaluation and we will check laboratories of CBC and CMP at that time.  The patient and her husband were given verbal and written directions regarding all chemotherapy-related medications including Dexamethasone, Zofran, Compazine, Ativan, and [name] injection. They voiced understanding.  All questions answered.  Natalie Arias where encouraged to contact us in the interim with any questions, concerns, or problems.  Natalie Bras, NP-C 10/27/2013  4:36 PM   ATTENDING'S ATTESTATION:  I personally reviewed patient's chart, examined patient myself, formulated the treatment plan as followed.   Overall patient is doing well. She now is to begin neoadjuvant curative intent chemotherapy cycle 1 starting today. Her chemotherapy will consist of Adriamycin Cytoxan. We do plan on doing a total of 4 cycles. This will then be followed by neoadjuvant Taxol carboplatinum for a total of 12 weeks. She will then undergo surgery followed by radiation therapy. Patient understands risks benefits and side effects and rationale of chemotherapy. She will be seen very closely by [place] on a weekly basis.  Patient has her antiemetics. We explained to her how to take them.  The patient will be seen back in one week's time for followup  Drue Second, MD Medical/Oncology [facility] [phone] (beeper) [phone] (Office)  11/03/2013, 8:55 AM

## 2013-10-27 NOTE — Progress Notes (Signed)
Pt reports burning in sinuses - rate slowed to 413 mL/hr = 45 minute infusion.

## 2013-10-27 NOTE — Telephone Encounter (Signed)
, °

## 2013-10-27 NOTE — Patient Instructions (Signed)
Please contact us at [phone] if you have any questions or concerns.  Please continue to do well and enjoy life!!!  Get plenty of rest, drink plenty of water, exercise daily (walking), eat a balanced diet.  Take Vitamin D3 1000 IUs daily.   Results for orders placed in visit on 10/27/13 (from the past 24 hour(s))  CBC WITH DIFFERENTIAL     Status: Abnormal   Collection Time    10/27/13  8:06 AM      Result Value Range   WBC 4.7  3.9 - 10.3 10e3/uL   NEUT# 2.8  1.5 - 6.5 10e3/uL   HGB 13.4  11.6 - 15.9 g/dL   HCT 16.1  09.6 - 04.5 %   Platelets 129 (*) 145 - 400 10e3/uL   MCV 88.0  79.5 - 101.0 fL   MCH 29.8  25.1 - 34.0 pg   MCHC 33.8  31.5 - 36.0 g/dL   RBC 4.09  8.11 - 9.14 10e6/uL   RDW 13.7  11.2 - 14.5 %   lymph# 1.1  0.9 - 3.3 10e3/uL   MONO# 0.5  0.1 - 0.9 10e3/uL   Eosinophils Absolute 0.3  0.0 - 0.5 10e3/uL   Basophils Absolute 0.1  0.0 - 0.1 10e3/uL   NEUT% 57.9  38.4 - 76.8 %   LYMPH% 23.9  14.0 - 49.7 %   MONO% 11.3  0.0 - 14.0 %   EOS% 5.7  0.0 - 7.0 %   BASO% 1.2  0.0 - 2.0 %   Narrative:    Performed At:  [facility]               [address]               [place], [place] [address]  COMPREHENSIVE METABOLIC PANEL (CC13)     Status: Abnormal   Collection Time    10/27/13  8:06 AM      Result Value Range   Sodium 145  136 - 145 mEq/L   Potassium 4.2  3.5 - 5.1 mEq/L   Chloride 112 (*) 98 - 109 mEq/L   CO2 26  22 - 29 mEq/L   Glucose 71  70 - 140 mg/dl   BUN 9.8  7.0 - 62.1 mg/dL   Creatinine 0.7  0.6 - 1.1 mg/dL   Total Bilirubin 3.08  0.20 - 1.20 mg/dL   Alkaline Phosphatase 67  40 - 150 U/L   AST 17  5 - 34 U/L   ALT 19  0 - 55 U/L   Total Protein 6.7  6.4 - 8.3 g/dL   Albumin 3.8  3.5 - 5.0 g/dL   Calcium 9.9  8.4 - 65.7 mg/dL   Anion Gap 8  3 - 11 mEq/L   Narrative:    Performed At:  [facility]               [address]               [place], [place] [address]

## 2013-10-28 ENCOUNTER — Ambulatory Visit (HOSPITAL_BASED_OUTPATIENT_CLINIC_OR_DEPARTMENT_OTHER): Payer: BC Managed Care – PPO

## 2013-10-28 ENCOUNTER — Telehealth: Payer: Self-pay | Admitting: *Deleted

## 2013-10-28 VITALS — BP 127/57 | HR 88 | Temp 97.4°F

## 2013-10-28 DIAGNOSIS — C50319 Malignant neoplasm of lower-inner quadrant of unspecified female breast: Secondary | ICD-10-CM

## 2013-10-28 DIAGNOSIS — C50311 Malignant neoplasm of lower-inner quadrant of right female breast: Secondary | ICD-10-CM

## 2013-10-28 MED ORDER — PEGFILGRASTIM INJECTION 6 MG/0.6ML
6.0000 mg | Freq: Once | SUBCUTANEOUS | Status: AC
  Administered 2013-10-28: 6 mg via SUBCUTANEOUS
  Filled 2013-10-28: qty 0.6

## 2013-10-28 NOTE — Patient Instructions (Signed)

## 2013-10-28 NOTE — Telephone Encounter (Signed)
Natalie Arias here for [name] injection following 1st ac chemotherapy.  States doing good except for headache.  No nausea, vomiting or diarrhea.  Is drinking and eating well.  All questions answered.  Knows to call if she has any problems or concerns.

## 2013-11-03 ENCOUNTER — Encounter: Payer: Self-pay | Admitting: Family

## 2013-11-03 ENCOUNTER — Other Ambulatory Visit (HOSPITAL_BASED_OUTPATIENT_CLINIC_OR_DEPARTMENT_OTHER): Payer: BC Managed Care – PPO | Admitting: Lab

## 2013-11-03 ENCOUNTER — Ambulatory Visit (HOSPITAL_BASED_OUTPATIENT_CLINIC_OR_DEPARTMENT_OTHER): Payer: BC Managed Care – PPO | Admitting: Family

## 2013-11-03 ENCOUNTER — Telehealth: Payer: Self-pay | Admitting: Oncology

## 2013-11-03 VITALS — BP 105/70 | HR 85 | Temp 98.7°F | Resp 18 | Ht 66.0 in | Wt 191.2 lb

## 2013-11-03 DIAGNOSIS — G4440 Drug-induced headache, not elsewhere classified, not intractable: Secondary | ICD-10-CM

## 2013-11-03 DIAGNOSIS — D6481 Anemia due to antineoplastic chemotherapy: Secondary | ICD-10-CM

## 2013-11-03 DIAGNOSIS — C50319 Malignant neoplasm of lower-inner quadrant of unspecified female breast: Secondary | ICD-10-CM

## 2013-11-03 DIAGNOSIS — D709 Neutropenia, unspecified: Secondary | ICD-10-CM

## 2013-11-03 DIAGNOSIS — C50311 Malignant neoplasm of lower-inner quadrant of right female breast: Secondary | ICD-10-CM

## 2013-11-03 DIAGNOSIS — Z171 Estrogen receptor negative status [ER-]: Secondary | ICD-10-CM

## 2013-11-03 LAB — COMPREHENSIVE METABOLIC PANEL (CC13)
AST: 29 U/L (ref 5–34)
Alkaline Phosphatase: 76 U/L (ref 40–150)
BUN: 16 mg/dL (ref 7.0–26.0)
Calcium: 9.4 mg/dL (ref 8.4–10.4)
Chloride: 105 mEq/L (ref 98–109)
Creatinine: 0.8 mg/dL (ref 0.6–1.1)
Glucose: 114 mg/dl (ref 70–140)
Potassium: 4.8 mEq/L (ref 3.5–5.1)

## 2013-11-03 LAB — CBC WITH DIFFERENTIAL/PLATELET
BASO%: 1.8 % (ref 0.0–2.0)
Basophils Absolute: 0 10*3/uL (ref 0.0–0.1)
EOS%: 21.6 % — ABNORMAL HIGH (ref 0.0–7.0)
HCT: 35.8 % (ref 34.8–46.6)
LYMPH%: 47.7 % (ref 14.0–49.7)
MCH: 30 pg (ref 25.1–34.0)
MCHC: 34.1 g/dL (ref 31.5–36.0)
NEUT%: 19 % — ABNORMAL LOW (ref 38.4–76.8)
Platelets: 100 10*3/uL — ABNORMAL LOW (ref 145–400)
RBC: 4.07 10*6/uL (ref 3.70–5.45)
lymph#: 0.5 10*3/uL — ABNORMAL LOW (ref 0.9–3.3)
nRBC: 0 % (ref 0–0)

## 2013-11-03 MED ORDER — CIPROFLOXACIN HCL 500 MG PO TABS: 500.0000 mg | ORAL_TABLET | Freq: Two times a day (BID) | ORAL | Status: DC

## 2013-11-03 NOTE — Telephone Encounter (Signed)
, °

## 2013-11-03 NOTE — Patient Instructions (Signed)
Please contact us at [phone] if you have any questions or concerns.  Please continue to do well and enjoy life!!!  Get plenty of rest, drink plenty of water, exercise daily (walking), eat a balanced diet.  Take Vitamin D3 1000 IUs daily.   Results for orders placed in visit on 11/03/13 (from the past 24 hour(s))  CBC WITH DIFFERENTIAL     Status: Abnormal   Collection Time    11/03/13  2:47 PM      Result Value Range   WBC 1.1 (*) 3.9 - 10.3 10e3/uL   NEUT# 0.2 (*) 1.5 - 6.5 10e3/uL   HGB 12.2  11.6 - 15.9 g/dL   HCT 16.1  09.6 - 04.5 %   Platelets 100 (*) 145 - 400 10e3/uL   MCV 88.0  79.5 - 101.0 fL   MCH 30.0  25.1 - 34.0 pg   MCHC 34.1  31.5 - 36.0 g/dL   RBC 4.09  8.11 - 9.14 10e6/uL   RDW 12.7  11.2 - 14.5 %   lymph# 0.5 (*) 0.9 - 3.3 10e3/uL   MONO# 0.1  0.1 - 0.9 10e3/uL   Eosinophils Absolute 0.2  0.0 - 0.5 10e3/uL   Basophils Absolute 0.0  0.0 - 0.1 10e3/uL   NEUT% 19.0 (*) 38.4 - 76.8 %   LYMPH% 47.7  14.0 - 49.7 %   MONO% 9.9  0.0 - 14.0 %   EOS% 21.6 (*) 0.0 - 7.0 %   BASO% 1.8  0.0 - 2.0 %   nRBC 0  0 - 0 %   Narrative:    Performed At:  [facility]               [address]               [place], [place] [address]     Neutropenia Neutropenia is a condition that occurs when the level of a certain type of white blood cell (neutrophil) in your body becomes lower than normal. Neutrophils are made in the bone marrow and fight infections. These cells protect against bacteria and viruses. The fewer neutrophils you have, and the longer your body remains without them, the greater your risk of getting a severe infection becomes. CAUSES  The cause of neutropenia may be hard to determine. However, it is usually due to 3 main problems:   Decreased production of neutrophils. This may be due to:  Certain medicines such as chemotherapy.  Genetic problems.  Cancer.  Radiation treatments.  Vitamin deficiency.  Some pesticides.  Increased  destruction of neutrophils. This may be due to:  Overwhelming infections.  Hemolytic anemia. This is when the body destroys its own blood cells.  Chemotherapy.  Neutrophils moving to areas of the body where they cannot fight infections. This may be due to:  Dialysis procedures.  Conditions where the spleen becomes enlarged. Neutrophils are held in the spleen and are not available to the rest of the body.  Overwhelming infections. The neutrophils are held in the area of the infection and are not available to the rest of the body. SYMPTOMS  There are no specific symptoms of neutropenia. The lack of neutrophils can result in an infection, and an infection can cause various problems. DIAGNOSIS  Diagnosis is made by a blood test. A complete blood count is performed. The normal level of neutrophils in human blood differs with age and race. Infants have lower counts than older children and adults. African Americans  have lower counts than Caucasians or Asians. The average adult level is 1500 cells/mm3 of blood. Neutrophil counts are interpreted as follows:  Greater than 1000 cells/mm3 gives normal protection against infection.  500 to 1000 cells/mm3 gives an increased risk for infection.  200 to 500 cells/mm3 is a greater risk for severe infection.  Lower than 200 cells/mm3 is a marked risk of infection. This may require hospitalization and treatment with antibiotic medicines. TREATMENT  Treatment depends on the underlying cause, severity, and presence of infections or symptoms. It also depends on your health. Your caregiver will discuss the treatment plan with you. Mild cases are often easily treated and have a good outcome. Preventative measures may also be started to limit your risk of infections. Treatment can include:  Taking antibiotics.  Stopping medicines that are known to cause neutropenia.  Correcting nutritional deficiencies by eating green vegetables to supply folic acid and  taking vitamin B supplements.  Stopping exposure to pesticides if your neutropenia is related to pesticide exposure.  Taking a blood growth factor called sargramostim, pegfilgrastim, or filgrastim if you are undergoing chemotherapy for cancer. This stimulates white blood cell production.  Removal of the spleen if you have Felty's syndrome and have repeated infections. HOME CARE INSTRUCTIONS   Follow your caregiver's instructions about when you need to have blood work done.  Wash your hands often. Make sure others who come in contact with you also wash their hands.  Wash raw fruits and vegetables before eating them. They can carry bacteria and fungi.  Avoid people with colds or spreadable (contagious) diseases (chickenpox, herpes zoster, influenza).  Avoid large crowds.  Avoid construction areas. The dust can release fungus into the air.  Be cautious around children in daycare or school environments.  Take care of your respiratory system by coughing and deep breathing.  Bathe daily.  Protect your skin from cuts and burns.  Do not work in the garden or with flowers and plants.  Care for the mouth before and after meals by brushing with a soft toothbrush. If you have mucositis, do not use mouthwash. Mouthwash contains alcohol and can dry out the mouth even more.  Clean the area between the genitals and the anus (perineal area) after urination and bowel movements. Women need to wipe from front to back.  Use a water soluble lubricant during sexual intercourse and practice good hygiene after. Do not have intercourse if you are severely neutropenic. Check with your caregiver for guidelines.  Exercise daily as tolerated.  Avoid people who were vaccinated with a live vaccine in the past 30 days. You should not receive live vaccines (polio, typhoid).  Do not provide direct care for pets. Avoid animal droppings. Do not clean litter boxes and bird cages.  Do not share food  utensils.  Do not use tampons, enemas, or rectal suppositories unless directed by your caregiver.  Use an electric razor to remove hair.  Wash your hands after handling magazines, letters, and newspapers. SEEK IMMEDIATE MEDICAL CARE IF:   You have a fever over 100.5 Farenheit  You have chills or start to shake.  You feel nauseous or vomit.  You develop mouth sores.  You develop aches and pains.  You have redness and swelling around open wounds.  Your skin is warm to the touch.  You have pus coming from your wounds.  You develop swollen lymph nodes.  You feel weak or fatigued.  You develop red streaks on the skin. MAKE SURE YOU:  Understand  these instructions.  Will watch your condition.  Will get help right away if you are not doing well or get worse.   Document Released: 06/07/2002 Document Revised: 03/09/2012 Document Reviewed: 07/05/2011 [facility] Patient Information 2014 [place], [place].

## 2013-11-03 NOTE — Progress Notes (Signed)
[facility]  Telephone:(336) [phone] Fax:(336) [phone]  OFFICE PROGRESS NOTE   ID: Natalie Arias   DOB: 04-07-1961  MR#: [id number]  JYN#:[id number]   PCP: Lovenia Kim, PA-C SU: Claud Kelp, MD RAD ONC:  Dorothy Puffer, MD   DIAGNOSES: Natalie Arias is a 52 y.o. 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   STAGE:  Breast cancer of lower-inner quadrant of right female breast   Primary site: Breast (Right)   Staging method: AJCC 7th Edition   Clinical: Stage IIA (T2, N0, cM0)   Summary: Stage IIA (T2, N0, cM0)   HISTORY OF PRESENT ILLNESS: Natalie Arias is a 52 y.o. female without significant past medical history.  Patient felt a mass in the right breast. Her last normal mammogram was in 11/2012.  In September 2014 patient felt a mass in the right breast. She brought a mass to the attention of her primary care physician.  A mammogram performed revealed a suspicious mass at the 4 o'clock position that was irregular and spiculated. On ultrasound the right breast mass measured 3.5 cm and was irregular with indistinct margins at 4 o'clock position anterior depth. No other significant masses, calcifications, or other findings were noted in either breast. A right breast needle core biopsy performed on 09/30/2013 resulted in pathology that revealed an invasive mammary carcinoma likely ductal phenotype, intermediate grade with a tumor that is estrogen receptor negative, progesterone receptor negative, HER-2/neu negative, with a Ki-67 80%. Bilateral breast MRI on 10/11/2013 that showed within the right breast there was an irregularly marginated enhancing mass with central necrosis located in the upper-inner quadrant at the 2:00 position (the middle 1/3).  The mass measures 3.9 x 3.7 x 3.1 cm in size and was associated with clip artifact.  There were no  additional areas of worrisome enhancement within the right breast, the left breast did not have mass or abnormal enhancement.  The lymph nodes showed no abnormal appearing lymph nodes (clinical stage IIA T2 N0).     PRIOR THERAPY: #1 Right breast needle core biopsy performed on 09/30/2013 resulting in pathology that revealed an invasive mammary carcinoma likely ductal phenotype, intermediate grade, with a tumor that is estrogen receptor negative, progesterone receptor negative, HER-2/neu negative, with a Ki-67 80%.  #2 Bilateral breast MRI on 10/11/2013 showed within the right breast there was an irregularly marginated enhancing mass with central necrosis located in the upper-inner quadrant at the 2 o'clock position (the middle 1/3).  The mass measures 3.9 x 3.7 x 3.1 cm in size and was associated with clip artifact.  There were no additional areas of worrisome enhancement within the right breast.  The left breast did not have mass or abnormal enhancement.  No abnormal appearing lymph nodes (clinical stage IIA T2 N0).     CURRENT THERAPY:  Neoadjuvant chemotherapy day consisting of dose dense Adriamycin/Cytoxan with 4 planned doses and day 2 granulocyte support with [name] injection.  This is scheduled to be followed by neoadjuvant chemotherapy consisting of Taxol/carboplatin weekly x 12 weeks.   INTERVAL HISTORY: Natalie Arias was seen today for followup of triple negative invasive ductal carcinoma of the right breast and after her first cycle of chemotherapy consisting of dose dense Adriamycin/Cytoxan..  She is accompanied for today's office visit by her husband Natalie Arias.  Since her last office visit on  10/27/2013 her interval history is significant for completing cycle #  1 of 4 of dose dense Adriamycin/Cytoxan and day 2 granulocyte support with [name].  She tolerated her first chemotherapy cycle fairly well with complaints of only fatigue and headaches.  Exercise as tolerated (walking) was  recommended for chemotherapy-induced fatigue and Advil relieved her headaches.  Natalie Arias has been able to continue working at her job after first chemotherapy treatment.   Her interval history is otherwise unremarkable.   PAST MEDICAL HISTORY: Past Medical History  Diagnosis Date  . Asthma 20's    allergic asthema  . Seasonal allergies   . PONV (postoperative nausea and vomiting)   . Breast cancer dx'd 10/01/2013    right    PAST SURGICAL HISTORY: Past Surgical History  Procedure Laterality Date  . Laparoscopic endometriosis fulguration  1991  . Abdominal hysterectomy  1992  . Portacath placement N/A 10/18/2013    Procedure: INSERTION PORT-A-CATH WITH ULTRASOUND;  Surgeon: Ernestene Mention, MD;  Location: [facility];  Service: General;  Laterality: N/A;    FAMILY HISTORY: Family History  Problem Relation Age of Onset  . Lung cancer Maternal Uncle   . Breast cancer Paternal Aunt   . Breast cancer Maternal Grandmother   . Lung cancer Maternal Uncle   . Hypertension Father   . Heart disease Father   . Parkinsonism Father   . Cancer Father     Skin Cancer  . Hypercholesterolemia Mother   . Gallstones Mother   . Healthy Brother     SOCIAL HISTORY: History  Substance Use Topics  . Smoking status: Never Smoker   . Smokeless tobacco: Never Used  . Alcohol Use: Yes     Comment: once a month    ALLERGIES: Allergies  Allergen Reactions  . Penicillins Hives    CURRENT MEDICATIONS: Current Outpatient Prescriptions  Medication Sig Dispense Refill  . ciprofloxacin (CIPRO) 500 MG tablet Take 1 tablet (500 mg total) by mouth 2 (two) times daily.  14 tablet  3  . dexamethasone (DECADRON) 4 MG tablet Take 2 tablets by mouth once a day on the day after chemotherapy and then take 2 tablets two times a day for 2 days. Take with food.  30 tablet  1  . HYDROcodone-acetaminophen (NORCO/VICODIN) 5-325 MG per tablet Take 1-2 tablets by mouth Arias 4 (four) hours as needed for pain.  30  tablet  0  . ibuprofen (ADVIL,MOTRIN) 200 MG tablet Take 200 mg by mouth Arias 6 (six) hours as needed for pain.      Marland Kitchen lidocaine-prilocaine (EMLA) cream Apply topically as needed. To port-a-cath 1.5 hours before chemotherapy and cover  30 g  1  . ondansetron (ZOFRAN) 8 MG tablet Take 1 tablet (8 mg total) by mouth 2 (two) times daily as needed. Take two times a day as needed for nausea or vomiting starting on the third day after chemotherapy.  30 tablet  1  . prochlorperazine (COMPAZINE) 10 MG tablet Take 1 tablet (10 mg total) by mouth Arias 6 (six) hours as needed (Nausea or vomiting).  30 tablet  1  . UNABLE TO FIND 1 each by Other route as needed. Cranial Prosthesis      . aspirin 81 MG tablet Take 81 mg by mouth Arias morning.       Marland Kitchen LORazepam (ATIVAN) 0.5 MG tablet Take 1 tablet (0.5 mg total) by mouth Arias 6 (six) hours as needed (Nausea or vomiting).  30 tablet  0   No current facility-administered medications for this visit.    OB/GYN  HISTORY: Menarche at age 40 patient underwent surgical menopause in 1992 she has been on hormone replacement therapy she will now discontinue this. She has been using patches for 7 years. First live birth was at 54.   HEALTH MAINTENANCE: Colonoscopy:   yes 2014 Bone Density:   2012 Last PAP smear:   2013   GENETIC COUNSELING/TESTING: She is scheduled to be seen by Genetic Counselor Maylon Cos, MS, CGC on 01/06/2014.   REVIEW OF SYSTEMS: Pertinent items are noted in HPI.   A 10 point review of systems was completed and is negative except as noted above.  Natalie Arias specifically denies any other symptomatology including fever or chills, headache, vision changes, swollen glands, cough or shortness of breath, chest pain or discomfort, nausea, vomiting, diarrhea, constipation, change in urinary or bowel habits, arthralgias/myalgias, unusual bleeding/bruising or any other symptomatology.   PHYSICAL EXAMINATION: Blood pressure 105/70, pulse 85,  temperature 98.7 F (37.1 C), temperature source Oral, resp. rate 18, height 5\' 6"  (1.676 m), weight 191 lb 3.2 oz (86.728 kg).  ECOG FS: 1 - Symptomatic but completely ambulatory  General appearance: Alert, cooperative, well nourished, no apparent distress Head: Normocephalic, without obvious abnormality, atraumatic Eyes: Conjunctivae/corneas clear, PERRLA, EOMI Nose: Nares, septum and mucosa are normal, no drainage or sinus tenderness Neck: No adenopathy, supple, symmetrical, trachea midline, no tenderness Resp: Clear to auscultation bilaterally, no wheezes/rales/rhonchi Cardio: Regular rate and rhythm, S1, S2 normal, no murmur, click, rub or gallop, no edema, left chest Port-A-Cath without signs of infection Breasts:  Deferred GI: Soft, not distended, non-tender, hypoactive bowel sounds, no organomegaly Skin: No rashes/lesions, skin warm and dry, no erythematous areas, no cyanosis  M/S:  Atraumatic, normal strength in all extremities, normal range of motion, no clubbing  Lymph nodes: Cervical, supraclavicular, and axillary nodes normal Neurologic: Grossly normal, cranial nerves II through XII intact, alert and oriented x 3 Psych: Appropriate affect    LABORATORIES:   Chemistry      Component Value Date/Time   NA 140 11/03/2013 1447   NA 140 10/14/2013 0930   K 4.8 11/03/2013 1447   K 4.7 10/14/2013 0930   CL 105 10/14/2013 0930   CO2 28 11/03/2013 1447   CO2 29 10/14/2013 0930   BUN 16.0 11/03/2013 1447   BUN 12 10/14/2013 0930   CREATININE 0.8 11/03/2013 1447   CREATININE 0.71 10/14/2013 0930      Component Value Date/Time   CALCIUM 9.4 11/03/2013 1447   CALCIUM 9.6 10/14/2013 0930   ALKPHOS 76 11/03/2013 1447   ALKPHOS 70 10/14/2013 0930   AST 29 11/03/2013 1447   AST 16 10/14/2013 0930   ALT 84* 11/03/2013 1447   ALT 18 10/14/2013 0930   BILITOT 0.33 11/03/2013 1447   BILITOT 0.3 10/14/2013 0930      Lab Results  Component Value Date   WBC 1.1* 11/03/2013   HGB 12.2  11/03/2013   HCT 35.8 11/03/2013   MCV 88.0 11/03/2013   PLT 100* 11/03/2013    STUDIES/IMAGING: 1.  2-D echocardiogram on 10/11/2013 showed a left ventricular ejection fraction of 60% - 65%.    ASSESSMENT: Murray Guzzetta is a 52 y.o. female diagnosed with triple negative invasive ductal carcinoma of the right breast in 09/2013:  #1 Intermediate grade invasive ductal carcinoma of the right breast. The mass measures 3.5 cm by ultrasound. Prognostic markers revealed the tumor to be estrogen receptor negative, progesterone receptor negative, HER-2/neu negative, with an elevated proliferation marker Ki-67 of 80%.   #  2  Bilateral breast MRI on 10/11/2013 showed within the right breast there was an irregularly marginated enhancing mass with central necrosis located in the upper-inner quadrant at the 2 o'clock position (the middle 1/3).  The mass measures 3.9 x 3.7 x 3.1 cm in size and was associated with clip artifact.  There were no additional areas of worrisome enhancement within the right breast.  The left breast did not have mass or abnormal enhancement.  No abnormal appearing lymph nodes (clinical stage IIA T2 N0).    #3  Started neoadjuvant chemotherapy day #1 cycle #1 of dose dense Adriamycin/Cytoxan with 4 planned doses on 10/27/2013.  Day 2 granulocyte support with [name] injection.  This is scheduled to be followed by neoadjuvant chemotherapy consisting of Taxol/Carboplatin weekly x 12 weeks.  #4  If patient does successfully undergo lumpectomy she will need radiation therapy.  #5 Patient has a family history of breast cancer and herself has triple negative breast cancer and is scheduled to be seen by the genetic counselor on 01/06/2014.  #6 Neutropenia  #7 Chemotherapy-induced headaches and fatigue (Advil relieves her headaches, walking as tolerated suggested for fatigue)   PLAN:  #1 Neutropenic precautions were explained to Mr. and Mrs. Diez in detail.  Written literature regarding  neutropenic precautions/neutropenia were also provided to them.  An electronic prescription for Ciprofloxacin 500 mg by mouth twice a day x 7 days  #14 with 3 refills was sent to the patient's pharmacy.    #2 Natalie Arias is scheduled to receive chemotherapy cycle #2 of 4 consisting of dose dense Adriamycin/Cytoxan on 11/10/2013.  [name] injection is scheduled for day 2.  We plan to see her again on 11/10/2013 for a office visit/physical assessment/laboratory evaluation prior to proceeding with chemotherapy cycle #2 of dose dense Adriamycin/Cytoxan.  All questions answered.  Mr. and Mrs. Kissel where encouraged to contact us in the interim with any questions, concerns, or problems.  Larina Bras, NP-C 11/03/2013  4:33 PM

## 2013-11-04 ENCOUNTER — Other Ambulatory Visit: Payer: Self-pay

## 2013-11-04 ENCOUNTER — Telehealth: Payer: Self-pay | Admitting: *Deleted

## 2013-11-04 NOTE — Telephone Encounter (Signed)
Per staff message and POF I have scheduled appts.  JMW  

## 2013-11-04 NOTE — Telephone Encounter (Signed)
sw pt informed her that [facility] was leaving. gv appt for 12/15/13 w/labs@ 1:45pm and ov @ 2:15pm. Pt is aware...td

## 2013-11-09 ENCOUNTER — Encounter: Payer: Self-pay | Admitting: Adult Health

## 2013-11-10 ENCOUNTER — Encounter: Payer: Self-pay | Admitting: Adult Health

## 2013-11-10 ENCOUNTER — Telehealth: Payer: Self-pay | Admitting: Oncology

## 2013-11-10 ENCOUNTER — Ambulatory Visit (HOSPITAL_BASED_OUTPATIENT_CLINIC_OR_DEPARTMENT_OTHER): Payer: BC Managed Care – PPO

## 2013-11-10 ENCOUNTER — Other Ambulatory Visit (HOSPITAL_BASED_OUTPATIENT_CLINIC_OR_DEPARTMENT_OTHER): Payer: BC Managed Care – PPO | Admitting: Lab

## 2013-11-10 ENCOUNTER — Ambulatory Visit (HOSPITAL_BASED_OUTPATIENT_CLINIC_OR_DEPARTMENT_OTHER): Payer: BC Managed Care – PPO | Admitting: Adult Health

## 2013-11-10 VITALS — BP 126/72 | HR 93 | Temp 98.4°F | Resp 20 | Ht 66.0 in | Wt 190.8 lb

## 2013-11-10 DIAGNOSIS — C50311 Malignant neoplasm of lower-inner quadrant of right female breast: Secondary | ICD-10-CM

## 2013-11-10 DIAGNOSIS — C50319 Malignant neoplasm of lower-inner quadrant of unspecified female breast: Secondary | ICD-10-CM

## 2013-11-10 DIAGNOSIS — Z171 Estrogen receptor negative status [ER-]: Secondary | ICD-10-CM

## 2013-11-10 DIAGNOSIS — Z5111 Encounter for antineoplastic chemotherapy: Secondary | ICD-10-CM

## 2013-11-10 DIAGNOSIS — D709 Neutropenia, unspecified: Secondary | ICD-10-CM

## 2013-11-10 LAB — COMPREHENSIVE METABOLIC PANEL (CC13)
Albumin: 3.9 g/dL (ref 3.5–5.0)
Alkaline Phosphatase: 88 U/L (ref 40–150)
Anion Gap: 12 mEq/L — ABNORMAL HIGH (ref 3–11)
BUN: 14.7 mg/dL (ref 7.0–26.0)
CO2: 24 mEq/L (ref 22–29)
Calcium: 9.3 mg/dL (ref 8.4–10.4)
Glucose: 125 mg/dl (ref 70–140)
Potassium: 4 mEq/L (ref 3.5–5.1)
Total Bilirubin: 0.26 mg/dL (ref 0.20–1.20)

## 2013-11-10 LAB — CBC WITH DIFFERENTIAL/PLATELET
Basophils Absolute: 0.1 10*3/uL (ref 0.0–0.1)
Eosinophils Absolute: 0 10*3/uL (ref 0.0–0.5)
HGB: 12.8 g/dL (ref 11.6–15.9)
LYMPH%: 15.2 % (ref 14.0–49.7)
MCV: 88.8 fL (ref 79.5–101.0)
MONO%: 5.4 % (ref 0.0–14.0)
NEUT#: 4.3 10*3/uL (ref 1.5–6.5)
Platelets: 124 10*3/uL — ABNORMAL LOW (ref 145–400)
RBC: 4.25 10*6/uL (ref 3.70–5.45)
RDW: 13.4 % (ref 11.2–14.5)
lymph#: 0.8 10*3/uL — ABNORMAL LOW (ref 0.9–3.3)

## 2013-11-10 MED ORDER — PALONOSETRON HCL INJECTION 0.25 MG/5ML
0.2500 mg | Freq: Once | INTRAVENOUS | Status: AC
  Administered 2013-11-10: 0.25 mg via INTRAVENOUS

## 2013-11-10 MED ORDER — SODIUM CHLORIDE 0.9 % IJ SOLN
10.0000 mL | INTRAMUSCULAR | Status: DC | PRN
  Administered 2013-11-10: 10 mL
  Filled 2013-11-10: qty 10

## 2013-11-10 MED ORDER — DEXAMETHASONE SODIUM PHOSPHATE 20 MG/5ML IJ SOLN
INTRAMUSCULAR | Status: AC
  Filled 2013-11-10: qty 5

## 2013-11-10 MED ORDER — DEXAMETHASONE SODIUM PHOSPHATE 20 MG/5ML IJ SOLN
12.0000 mg | Freq: Once | INTRAMUSCULAR | Status: AC
  Administered 2013-11-10: 12 mg via INTRAVENOUS

## 2013-11-10 MED ORDER — PALONOSETRON HCL INJECTION 0.25 MG/5ML
INTRAVENOUS | Status: AC
  Filled 2013-11-10: qty 5

## 2013-11-10 MED ORDER — SODIUM CHLORIDE 0.9 % IV SOLN
600.0000 mg/m2 | Freq: Once | INTRAVENOUS | Status: AC
  Administered 2013-11-10: 1200 mg via INTRAVENOUS
  Filled 2013-11-10: qty 60

## 2013-11-10 MED ORDER — DOXORUBICIN HCL CHEMO IV INJECTION 2 MG/ML
60.0000 mg/m2 | Freq: Once | INTRAVENOUS | Status: AC
  Administered 2013-11-10: 120 mg via INTRAVENOUS
  Filled 2013-11-10: qty 60

## 2013-11-10 MED ORDER — SODIUM CHLORIDE 0.9 % IV SOLN
Freq: Once | INTRAVENOUS | Status: AC
  Administered 2013-11-10: 15:00:00 via INTRAVENOUS

## 2013-11-10 MED ORDER — HEPARIN SOD (PORK) LOCK FLUSH 100 UNIT/ML IV SOLN
500.0000 [IU] | Freq: Once | INTRAVENOUS | Status: AC | PRN
  Administered 2013-11-10: 500 [IU]
  Filled 2013-11-10: qty 5

## 2013-11-10 MED ORDER — SODIUM CHLORIDE 0.9 % IV SOLN
150.0000 mg | Freq: Once | INTRAVENOUS | Status: AC
  Administered 2013-11-10: 150 mg via INTRAVENOUS
  Filled 2013-11-10: qty 5

## 2013-11-10 NOTE — Patient Instructions (Signed)
[facility] Discharge Instructions for Patients Receiving Chemotherapy  Today you received the following chemotherapy agents Adriamycin and Cytoxan  To help prevent nausea and vomiting after your treatment, we encourage you to take your nausea medication as directed If you develop nausea and vomiting that is not controlled by your nausea medication, call the clinic.   BELOW ARE SYMPTOMS THAT SHOULD BE REPORTED IMMEDIATELY:  *FEVER GREATER THAN 100.5 F  *CHILLS WITH OR WITHOUT FEVER  NAUSEA AND VOMITING THAT IS NOT CONTROLLED WITH YOUR NAUSEA MEDICATION  *UNUSUAL SHORTNESS OF BREATH  *UNUSUAL BRUISING OR BLEEDING  TENDERNESS IN MOUTH AND THROAT WITH OR WITHOUT PRESENCE OF ULCERS  *URINARY PROBLEMS  *BOWEL PROBLEMS  UNUSUAL RASH Items with * indicate a potential emergency and should be followed up as soon as possible.  Feel free to call the clinic you have any questions or concerns. The clinic phone number is [phone].    

## 2013-11-10 NOTE — Progress Notes (Addendum)
[facility]  Telephone:(336) [phone] Fax:(336) [phone]  OFFICE PROGRESS NOTE   ID: Natalie Arias   DOB: 05/26/1961  MR#: [id number]  KGM#:[id number]   PCP: Lovenia Kim, PA-C SU: Claud Kelp, MD RAD ONC:  Dorothy Puffer, MD   DIAGNOSES: Natalie Arias is a 52 y.o. 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   STAGE:  Breast cancer of lower-inner quadrant of right female breast   Primary site: Breast (Right)   Staging method: AJCC 7th Edition   Clinical: Stage IIA (T2, N0, cM0)   Summary: Stage IIA (T2, N0, cM0)   PRIOR THERAPY: #1 Right breast needle core biopsy performed on 09/30/2013 resulting in pathology that revealed an invasive mammary carcinoma likely ductal phenotype, intermediate grade, with a tumor that is estrogen receptor negative, progesterone receptor negative, HER-2/neu negative, with a Ki-67 80%.  #2 Bilateral breast MRI on 10/11/2013 showed within the right breast there was an irregularly marginated enhancing mass with central necrosis located in the upper-inner quadrant at the 2 o'clock position (the middle 1/3).  The mass measures 3.9 x 3.7 x 3.1 cm in size and was associated with clip artifact.  There were no additional areas of worrisome enhancement within the right breast.  The left breast did not have mass or abnormal enhancement.  No abnormal appearing lymph nodes (clinical stage IIA T2 N0).  #3 Patient was recommended neoadjuvant chemotherapy utilizing Adriamycin/Cytoxan x 4 cycles with neulasta support followed by Taxol/Carbo weekly x 12 weeks.       CURRENT THERAPY: Adriamycin/Cytoxan cycle 2 day 1  INTERVAL HISTORY: Natalie Arias was seen today for evaluation prior to her second cycle of Adriamycin/Cytoxan.  She is doing well today.  She tolerated her first cycle of chemotherapy relatively well.  She did become  neutropenic and tolerated the Cipro prescribed without difficulty.  Today, she denies fevers, chills, nausea, vomiting, constipation, diarrhea, skin changes, or any other concerns.  A 10 point ROS is negative.    PAST MEDICAL HISTORY: Past Medical History  Diagnosis Date  . Asthma 20's    allergic asthema  . Seasonal allergies   . PONV (postoperative nausea and vomiting)   . Breast cancer dx'd 10/01/2013    right    PAST SURGICAL HISTORY: Past Surgical History  Procedure Laterality Date  . Laparoscopic endometriosis fulguration  1991  . Abdominal hysterectomy  1992  . Portacath placement N/A 10/18/2013    Procedure: INSERTION PORT-A-CATH WITH ULTRASOUND;  Surgeon: Ernestene Mention, MD;  Location: [facility];  Service: General;  Laterality: N/A;    FAMILY HISTORY: Family History  Problem Relation Age of Onset  . Lung cancer Maternal Uncle   . Breast cancer Paternal Aunt   . Breast cancer Maternal Grandmother   . Lung cancer Maternal Uncle   . Hypertension Father   . Heart disease Father   . Parkinsonism Father   . Cancer Father     Skin Cancer  . Hypercholesterolemia Mother   . Gallstones Mother   . Healthy Brother     SOCIAL HISTORY: History  Substance Use Topics  . Smoking status: Never Smoker   . Smokeless tobacco: Never Used  . Alcohol Use: Yes     Comment: once a month    ALLERGIES: Allergies  Allergen Reactions  . Penicillins Hives    CURRENT MEDICATIONS: Current Outpatient Prescriptions  Medication Sig Dispense Refill  .  aspirin 81 MG tablet Take 81 mg by mouth every morning.       . ciprofloxacin (CIPRO) 500 MG tablet Take 1 tablet (500 mg total) by mouth 2 (two) times daily.  14 tablet  3  . dexamethasone (DECADRON) 4 MG tablet Take 2 tablets by mouth once a day on the day after chemotherapy and then take 2 tablets two times a day for 2 days. Take with food.  30 tablet  1  . HYDROcodone-acetaminophen (NORCO/VICODIN) 5-325 MG per tablet Take 1-2 tablets  by mouth every 4 (four) hours as needed for pain.  30 tablet  0  . ibuprofen (ADVIL,MOTRIN) 200 MG tablet Take 200 mg by mouth every 6 (six) hours as needed for pain.      Marland Kitchen lidocaine-prilocaine (EMLA) cream Apply topically as needed. To port-a-cath 1.5 hours before chemotherapy and cover  30 g  1  . LORazepam (ATIVAN) 0.5 MG tablet Take 1 tablet (0.5 mg total) by mouth every 6 (six) hours as needed (Nausea or vomiting).  30 tablet  0  . ondansetron (ZOFRAN) 8 MG tablet Take 1 tablet (8 mg total) by mouth 2 (two) times daily as needed. Take two times a day as needed for nausea or vomiting starting on the third day after chemotherapy.  30 tablet  1  . prochlorperazine (COMPAZINE) 10 MG tablet Take 1 tablet (10 mg total) by mouth every 6 (six) hours as needed (Nausea or vomiting).  30 tablet  1  . UNABLE TO FIND 1 each by Other route as needed. Cranial Prosthesis       No current facility-administered medications for this visit.   REVIEW OF SYSTEMS:  A 10 point review of systems was conducted and is otherwise negative except for what is noted above.      PHYSICAL EXAMINATION: Blood pressure 126/72, pulse 93, temperature 98.4 F (36.9 C), temperature source Oral, resp. rate 20, height 5\' 6"  (1.676 m), weight 190 lb 12.8 oz (86.546 kg). General: Patient is a well appearing female in no acute distress HEENT: PERRLA, sclerae anicteric no conjunctival pallor, MMM Neck: supple, no palpable adenopathy Lungs: clear to auscultation bilaterally, no wheezes, rhonchi, or rales Cardiovascular: regular rate rhythm, S1, S2, no murmurs, rubs or gallops Abdomen: Soft, non-tender, non-distended, normoactive bowel sounds, no HSM Extremities: warm and well perfused, no clubbing, cyanosis, or edema Skin: No rashes or lesions Neuro: Non-focal Breasts: right breast with approximately 3cm palpable soft mass ECOG FS: 1 - Symptomatic but completely ambulatory  LABORATORIES:   Chemistry      Component Value  Date/Time   NA 143 11/10/2013 1314   NA 140 10/14/2013 0930   K 4.0 11/10/2013 1314   K 4.7 10/14/2013 0930   CL 105 10/14/2013 0930   CO2 24 11/10/2013 1314   CO2 29 10/14/2013 0930   BUN 14.7 11/10/2013 1314   BUN 12 10/14/2013 0930   CREATININE 0.7 11/10/2013 1314   CREATININE 0.71 10/14/2013 0930      Component Value Date/Time   CALCIUM 9.3 11/10/2013 1314   CALCIUM 9.6 10/14/2013 0930   ALKPHOS 88 11/10/2013 1314   ALKPHOS 70 10/14/2013 0930   AST 23 11/10/2013 1314   AST 16 10/14/2013 0930   ALT 57* 11/10/2013 1314   ALT 18 10/14/2013 0930   BILITOT 0.26 11/10/2013 1314   BILITOT 0.3 10/14/2013 0930      Lab Results  Component Value Date   WBC 5.5 11/10/2013   HGB 12.8 11/10/2013  HCT 37.7 11/10/2013   MCV 88.8 11/10/2013   PLT 124* 11/10/2013    STUDIES/IMAGING: 1.  2-D echocardiogram on 10/11/2013 showed a left ventricular ejection fraction of 60% - 65%.    ASSESSMENT: Natalie Arias is a 52 y.o. female diagnosed with triple negative invasive ductal carcinoma of the right breast in 09/2013:  #1 Intermediate grade invasive ductal carcinoma of the right breast. The mass measures 3.5 cm by ultrasound. Prognostic markers revealed the tumor to be estrogen receptor negative, progesterone receptor negative, HER-2/neu negative, with an elevated proliferation marker Ki-67 of 80%.   #2  Bilateral breast MRI on 10/11/2013 showed within the right breast there was an irregularly marginated enhancing mass with central necrosis located in the upper-inner quadrant at the 2 o'clock position (the middle 1/3).  The mass measures 3.9 x 3.7 x 3.1 cm in size and was associated with clip artifact.  There were no additional areas of worrisome enhancement within the right breast.  The left breast did not have mass or abnormal enhancement.  No abnormal appearing lymph nodes (clinical stage IIA T2 N0).    #3  Started neoadjuvant chemotherapy of dose dense Adriamycin/Cytoxan with 4 planned  doses on 10/27/2013.  Day 2 granulocyte support with [name] injection.  This is scheduled to be followed by neoadjuvant chemotherapy consisting of Taxol/Carboplatin weekly x 12 weeks.  #4  If patient does successfully undergo lumpectomy she will need radiation therapy.  #5 Patient has a family history of breast cancer and herself has triple negative breast cancer and is scheduled to be seen by the genetic counselor on 01/06/2014.  #6 Neutropenia  PLAN:   #1 Patient is doing well.  Her labs have recovered.  I reviewed them with her in detail.  She will proceed with chemotherapy today.    #2 She will return tomorrow for [name], and next week for labs and evaluation of chemotoxicities.   All questions answered.  Natalie Arias was encouraged to contact us in the interim with any questions, concerns, or problems.  I spent 25 minutes counseling the patient face to face.  The total time spent in the appointment was 30 minutes.  Illa Level, NP Medical Oncology [facility] [phone] 11/11/2013  2:54 PM  ATTENDING'S ATTESTATION:  I personally reviewed patient's chart, examined patient myself, formulated the treatment plan as followed.    52 year old female with triple negative invasive ductal carcinoma of the right breast diagnosed October 2014. She is currently receiving neoadjuvant chemotherapy consisting of Adriamycin Cytoxan. She is called to receive cycle 2 of her therapy. She tolerated cycle 1 very nicely. She understands risks benefits and side effects of treatment. She knows to call us with any problems.  Drue Second, MD Medical/Oncology [facility] [phone] (beeper) [phone] (Office)  12/02/2013, 6:39 PM

## 2013-11-10 NOTE — Telephone Encounter (Signed)
, °

## 2013-11-11 ENCOUNTER — Ambulatory Visit (HOSPITAL_BASED_OUTPATIENT_CLINIC_OR_DEPARTMENT_OTHER): Payer: BC Managed Care – PPO

## 2013-11-11 VITALS — BP 120/65 | HR 107 | Temp 98.0°F

## 2013-11-11 DIAGNOSIS — Z5189 Encounter for other specified aftercare: Secondary | ICD-10-CM

## 2013-11-11 DIAGNOSIS — C50311 Malignant neoplasm of lower-inner quadrant of right female breast: Secondary | ICD-10-CM

## 2013-11-11 DIAGNOSIS — C50319 Malignant neoplasm of lower-inner quadrant of unspecified female breast: Secondary | ICD-10-CM

## 2013-11-11 MED ORDER — PEGFILGRASTIM INJECTION 6 MG/0.6ML
6.0000 mg | Freq: Once | SUBCUTANEOUS | Status: AC
  Administered 2013-11-11: 6 mg via SUBCUTANEOUS
  Filled 2013-11-11: qty 0.6

## 2013-11-11 NOTE — Patient Instructions (Signed)

## 2013-11-12 ENCOUNTER — Other Ambulatory Visit: Payer: BC Managed Care – PPO | Admitting: Lab

## 2013-11-15 ENCOUNTER — Encounter: Payer: Self-pay | Admitting: *Deleted

## 2013-11-15 NOTE — Progress Notes (Signed)
Mailed after appt letter to pt. 

## 2013-11-16 ENCOUNTER — Encounter: Payer: Self-pay | Admitting: Adult Health

## 2013-11-17 ENCOUNTER — Ambulatory Visit (HOSPITAL_BASED_OUTPATIENT_CLINIC_OR_DEPARTMENT_OTHER): Payer: BC Managed Care – PPO | Admitting: Adult Health

## 2013-11-17 ENCOUNTER — Other Ambulatory Visit (HOSPITAL_BASED_OUTPATIENT_CLINIC_OR_DEPARTMENT_OTHER): Payer: BC Managed Care – PPO | Admitting: Lab

## 2013-11-17 ENCOUNTER — Encounter: Payer: Self-pay | Admitting: Adult Health

## 2013-11-17 VITALS — BP 119/80 | HR 112 | Temp 97.9°F | Resp 20 | Ht 66.0 in | Wt 191.0 lb

## 2013-11-17 DIAGNOSIS — C50319 Malignant neoplasm of lower-inner quadrant of unspecified female breast: Secondary | ICD-10-CM

## 2013-11-17 DIAGNOSIS — C50311 Malignant neoplasm of lower-inner quadrant of right female breast: Secondary | ICD-10-CM

## 2013-11-17 DIAGNOSIS — K649 Unspecified hemorrhoids: Secondary | ICD-10-CM

## 2013-11-17 DIAGNOSIS — D702 Other drug-induced agranulocytosis: Secondary | ICD-10-CM

## 2013-11-17 DIAGNOSIS — D6481 Anemia due to antineoplastic chemotherapy: Secondary | ICD-10-CM

## 2013-11-17 DIAGNOSIS — Z803 Family history of malignant neoplasm of breast: Secondary | ICD-10-CM

## 2013-11-17 DIAGNOSIS — Z171 Estrogen receptor negative status [ER-]: Secondary | ICD-10-CM

## 2013-11-17 LAB — COMPREHENSIVE METABOLIC PANEL (CC13)
ALT: 92 U/L — ABNORMAL HIGH (ref 0–55)
Albumin: 3.7 g/dL (ref 3.5–5.0)
Anion Gap: 10 mEq/L (ref 3–11)
BUN: 17 mg/dL (ref 7.0–26.0)
CO2: 27 mEq/L (ref 22–29)
Calcium: 9.5 mg/dL (ref 8.4–10.4)
Chloride: 104 mEq/L (ref 98–109)
Creatinine: 1.1 mg/dL (ref 0.6–1.1)
Glucose: 135 mg/dl (ref 70–140)
Total Bilirubin: 0.52 mg/dL (ref 0.20–1.20)

## 2013-11-17 LAB — CBC WITH DIFFERENTIAL/PLATELET
Basophils Absolute: 0 10*3/uL (ref 0.0–0.1)
Eosinophils Absolute: 0 10*3/uL (ref 0.0–0.5)
HCT: 32.5 % — ABNORMAL LOW (ref 34.8–46.6)
HGB: 11.1 g/dL — ABNORMAL LOW (ref 11.6–15.9)
LYMPH%: 54.2 % — ABNORMAL HIGH (ref 14.0–49.7)
MONO#: 0.1 10*3/uL (ref 0.1–0.9)
NEUT#: 0.1 10*3/uL — CL (ref 1.5–6.5)
NEUT%: 14.5 % — ABNORMAL LOW (ref 38.4–76.8)
Platelets: 163 10*3/uL (ref 145–400)
WBC: 0.5 10*3/uL — CL (ref 3.9–10.3)
nRBC: 0 % (ref 0–0)

## 2013-11-17 MED ORDER — HYDROCORTISONE 2.5 % RE CREA: 1.0000 "application " | TOPICAL_CREAM | Freq: Two times a day (BID) | RECTAL | Status: DC

## 2013-11-17 MED ORDER — CIPROFLOXACIN HCL 500 MG PO TABS: 500.0000 mg | ORAL_TABLET | Freq: Two times a day (BID) | ORAL | Status: DC

## 2013-11-17 NOTE — Progress Notes (Signed)
[facility]  Telephone:(336) [phone] Fax:([phone]  OFFICE PROGRESS NOTE   ID: [name]   DOB: 03/12/1961  MR#: [id number]  BMW#:[id number]   PCP: Lovenia Kim, PA-C SU: Claud Kelp, MD RAD ONC:  Dorothy Puffer, MD   DIAGNOSES: Natalie Arias is a 52 y.o. 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   STAGE:  Breast cancer of lower-inner quadrant of right female breast   Primary site: Breast (Right)   Staging method: AJCC 7th Edition   Clinical: Stage IIA (T2, N0, cM0)   Summary: Stage IIA (T2, N0, cM0)   PRIOR THERAPY: #1 Right breast needle core biopsy performed on 09/30/2013 resulting in pathology that revealed an invasive mammary carcinoma likely ductal phenotype, intermediate grade, with a tumor that is estrogen receptor negative, progesterone receptor negative, HER-2/neu negative, with a Ki-67 80%.  #2 Bilateral breast MRI on 10/11/2013 showed within the right breast there was an irregularly marginated enhancing mass with central necrosis located in the upper-inner quadrant at the 2 o'clock position (the middle 1/3).  The mass measures 3.9 x 3.7 x 3.1 cm in size and was associated with clip artifact.  There were no additional areas of worrisome enhancement within the right breast.  The left breast did not have mass or abnormal enhancement.  No abnormal appearing lymph nodes (clinical stage IIA T2 N0).  #3 Patient was recommended neoadjuvant chemotherapy utilizing Adriamycin/Cytoxan x 4 cycles with neulasta support followed by Taxol/Carbo weekly x 12 weeks.       CURRENT THERAPY: Adriamycin/Cytoxan cycle 2 day 8  INTERVAL HISTORY: Natalie Arias was seen today for evaluation following her second cycle of Adriamycin/Cytoxan.  She is doing moderately well today.  She has had hemorhhoids since Friday.  She was also mildly constipated  and has taken stool softeners.  She has bled mildly from these on occasion.  She denies fevers, chills, nausea, vomiting, diarrhea, numbness or any further concerns.   PAST MEDICAL HISTORY: Past Medical History  Diagnosis Date  . Asthma 20's    allergic asthema  . Seasonal allergies   . PONV (postoperative nausea and vomiting)   . Breast cancer dx'd 10/01/2013    right    PAST SURGICAL HISTORY: Past Surgical History  Procedure Laterality Date  . Laparoscopic endometriosis fulguration  1991  . Abdominal hysterectomy  1992  . Portacath placement N/A 10/18/2013    Procedure: INSERTION PORT-A-CATH WITH ULTRASOUND;  Surgeon: Ernestene Mention, MD;  Location: [facility];  Service: General;  Laterality: N/A;    FAMILY HISTORY: Family History  Problem Relation Age of Onset  . Lung cancer Maternal Uncle   . Breast cancer Paternal Aunt   . Breast cancer Maternal Grandmother   . Lung cancer Maternal Uncle   . Hypertension Father   . Heart disease Father   . Parkinsonism Father   . Cancer Father     Skin Cancer  . Hypercholesterolemia Mother   . Gallstones Mother   . Healthy Brother     SOCIAL HISTORY: History  Substance Use Topics  . Smoking status: Never Smoker   . Smokeless tobacco: Never Used  . Alcohol Use: Yes     Comment: once a month    ALLERGIES: Allergies  Allergen Reactions  . Penicillins Hives    CURRENT MEDICATIONS: Current Outpatient Prescriptions  Medication Sig Dispense Refill  . dexamethasone (DECADRON) 4 MG tablet Take  2 tablets by mouth once a day on the day after chemotherapy and then take 2 tablets two times a day for 2 days. Take with food.  30 tablet  1  . ibuprofen (ADVIL,MOTRIN) 200 MG tablet Take 200 mg by mouth every 6 (six) hours as needed for pain.      Marland Kitchen lidocaine-prilocaine (EMLA) cream Apply topically as needed. To port-a-cath 1.5 hours before chemotherapy and cover  30 g  1  . ondansetron (ZOFRAN) 8 MG tablet Take 1 tablet (8 mg total) by  mouth 2 (two) times daily as needed. Take two times a day as needed for nausea or vomiting starting on the third day after chemotherapy.  30 tablet  1  . prochlorperazine (COMPAZINE) 10 MG tablet Take 1 tablet (10 mg total) by mouth every 6 (six) hours as needed (Nausea or vomiting).  30 tablet  1  . aspirin 81 MG tablet Take 81 mg by mouth every morning.       . ciprofloxacin (CIPRO) 500 MG tablet Take 1 tablet (500 mg total) by mouth 2 (two) times daily.  14 tablet  3  . HYDROcodone-acetaminophen (NORCO/VICODIN) 5-325 MG per tablet Take 1-2 tablets by mouth every 4 (four) hours as needed for pain.  30 tablet  0  . hydrocortisone (ANUSOL-HC) 2.5 % rectal cream Place 1 application rectally 2 (two) times daily.  30 g  0  . LORazepam (ATIVAN) 0.5 MG tablet Take 1 tablet (0.5 mg total) by mouth every 6 (six) hours as needed (Nausea or vomiting).  30 tablet  0  . UNABLE TO FIND 1 each by Other route as needed. Cranial Prosthesis       No current facility-administered medications for this visit.   REVIEW OF SYSTEMS:  A 10 point review of systems was conducted and is otherwise negative except for what is noted above.    PHYSICAL EXAMINATION: Blood pressure 119/80, pulse 112, temperature 97.9 F (36.6 C), temperature source Oral, resp. rate 20, height 5\' 6"  (1.676 m), weight 191 lb (86.637 kg). General: Patient is a well appearing female in no acute distress HEENT: PERRLA, sclerae anicteric no conjunctival pallor, MMM Neck: supple, no palpable adenopathy Lungs: clear to auscultation bilaterally, no wheezes, rhonchi, or rales Cardiovascular: regular rate rhythm, S1, S2, no murmurs, rubs or gallops Abdomen: Soft, non-tender, non-distended, normoactive bowel sounds, no HSM Extremities: warm and well perfused, no clubbing, cyanosis, or edema Skin: No rashes or lesions Neuro: Non-focal Breasts: right breast with approximately 3cm palpable soft mass ECOG FS: 1 - Symptomatic but completely  ambulatory  LABORATORIES:   Chemistry      Component Value Date/Time   NA 141 11/17/2013 1433   NA 140 10/14/2013 0930   K 4.2 11/17/2013 1433   K 4.7 10/14/2013 0930   CL 105 10/14/2013 0930   CO2 27 11/17/2013 1433   CO2 29 10/14/2013 0930   BUN 17.0 11/17/2013 1433   BUN 12 10/14/2013 0930   CREATININE 1.1 11/17/2013 1433   CREATININE 0.71 10/14/2013 0930      Component Value Date/Time   CALCIUM 9.5 11/17/2013 1433   CALCIUM 9.6 10/14/2013 0930   ALKPHOS 87 11/17/2013 1433   ALKPHOS 70 10/14/2013 0930   AST 17 11/17/2013 1433   AST 16 10/14/2013 0930   ALT 92* 11/17/2013 1433   ALT 18 10/14/2013 0930   BILITOT 0.52 11/17/2013 1433   BILITOT 0.3 10/14/2013 0930      Lab Results  Component Value Date  WBC 0.5* 11/17/2013   HGB 11.1* 11/17/2013   HCT 32.5* 11/17/2013   MCV 87.4 11/17/2013   PLT 163 11/17/2013    STUDIES/IMAGING: 1.  2-D echocardiogram on 10/11/2013 showed a left ventricular ejection fraction of 60% - 65%.    ASSESSMENT: Natalie Arias is a 52 y.o. female diagnosed with triple negative invasive ductal carcinoma of the right breast in 09/2013:  #1 Intermediate grade invasive ductal carcinoma of the right breast. The mass measures 3.5 cm by ultrasound. Prognostic markers revealed the tumor to be estrogen receptor negative, progesterone receptor negative, HER-2/neu negative, with an elevated proliferation marker Ki-67 of 80%.   #2  Bilateral breast MRI on 10/11/2013 showed within the right breast there was an irregularly marginated enhancing mass with central necrosis located in the upper-inner quadrant at the 2 o'clock position (the middle 1/3).  The mass measures 3.9 x 3.7 x 3.1 cm in size and was associated with clip artifact.  There were no additional areas of worrisome enhancement within the right breast.  The left breast did not have mass or abnormal enhancement.  No abnormal appearing lymph nodes (clinical stage IIA T2 N0).    #3  Started  neoadjuvant chemotherapy of dose dense Adriamycin/Cytoxan with 4 planned doses on 10/27/2013.  Day 2 granulocyte support with [name] injection.  This is scheduled to be followed by neoadjuvant chemotherapy consisting of Taxol/Carboplatin weekly x 12 weeks.  #4  If patient does successfully undergo lumpectomy she will need radiation therapy.  #5 Patient has a family history of breast cancer and herself has triple negative breast cancer and is scheduled to be seen by the genetic counselor on 01/06/2014.  #6 Neutropenia  PLAN:   #1 Patient is doing well.  She is neutropenic today.  I have given her neutropenic instructions and she will restart Cipro today.    #2 I prescribed Anusol-HC for her hemorrhoids.  She will continue a stool softener BID as well as sitz baths.    #3 She will return next week for labs and an appointment prior to chemotherapy.   All questions answered.  Mrs. Toral was encouraged to contact us in the interim with any questions, concerns, or problems.  I spent 25 minutes counseling the patient face to face.  The total time spent in the appointment was 30 minutes.  Illa Level, NP Medical Oncology [facility] [phone] 11/17/2013  3:56 PM

## 2013-11-17 NOTE — Patient Instructions (Signed)
  Patient Neutropenia Instruction Sheet  Diagnosis: Breast Cancer      Treating Physician: Drue Second, MD  Treatment: 1. Type of chemotherapy: AC  2. Date of last treatment: 11/10/13  Last Blood Counts: Lab Results  Component Value Date   WBC 0.5* 11/17/2013   HGB 11.1* 11/17/2013   HCT 32.5* 11/17/2013   MCV 87.4 11/17/2013   PLT 163 11/17/2013   ANC 100     Prophylactic Antibiotics: Cipro 500 mg by mouth twice a day Instructions: 1. Monitor temperature and call if fever  greater than 100.5, chills, shaking chills (rigors) 2. Call Physician on-call at [phone] 3. Give him/her symptoms and list of medications that you are taking and your last blood count.

## 2013-11-18 ENCOUNTER — Telehealth: Payer: Self-pay | Admitting: *Deleted

## 2013-11-18 NOTE — Telephone Encounter (Signed)
Per staff message and POF I have scheduled appts.  JMW  

## 2013-11-24 ENCOUNTER — Ambulatory Visit (HOSPITAL_BASED_OUTPATIENT_CLINIC_OR_DEPARTMENT_OTHER): Payer: BC Managed Care – PPO | Admitting: Adult Health

## 2013-11-24 ENCOUNTER — Other Ambulatory Visit: Payer: BC Managed Care – PPO | Admitting: Lab

## 2013-11-24 ENCOUNTER — Ambulatory Visit: Payer: BC Managed Care – PPO

## 2013-11-24 ENCOUNTER — Other Ambulatory Visit (HOSPITAL_BASED_OUTPATIENT_CLINIC_OR_DEPARTMENT_OTHER): Payer: BC Managed Care – PPO | Admitting: Lab

## 2013-11-24 ENCOUNTER — Encounter: Payer: Self-pay | Admitting: Adult Health

## 2013-11-24 ENCOUNTER — Ambulatory Visit (HOSPITAL_BASED_OUTPATIENT_CLINIC_OR_DEPARTMENT_OTHER): Payer: BC Managed Care – PPO

## 2013-11-24 VITALS — BP 110/75 | HR 86 | Temp 99.9°F | Resp 18 | Ht 66.0 in | Wt 191.6 lb

## 2013-11-24 DIAGNOSIS — C50319 Malignant neoplasm of lower-inner quadrant of unspecified female breast: Secondary | ICD-10-CM

## 2013-11-24 DIAGNOSIS — C50311 Malignant neoplasm of lower-inner quadrant of right female breast: Secondary | ICD-10-CM

## 2013-11-24 DIAGNOSIS — Z803 Family history of malignant neoplasm of breast: Secondary | ICD-10-CM

## 2013-11-24 DIAGNOSIS — Z5111 Encounter for antineoplastic chemotherapy: Secondary | ICD-10-CM

## 2013-11-24 DIAGNOSIS — D702 Other drug-induced agranulocytosis: Secondary | ICD-10-CM

## 2013-11-24 DIAGNOSIS — Z171 Estrogen receptor negative status [ER-]: Secondary | ICD-10-CM

## 2013-11-24 LAB — CBC WITH DIFFERENTIAL/PLATELET
Basophils Absolute: 0.1 10*3/uL (ref 0.0–0.1)
EOS%: 0.2 % (ref 0.0–7.0)
Eosinophils Absolute: 0 10*3/uL (ref 0.0–0.5)
HCT: 34.8 % (ref 34.8–46.6)
HGB: 11.7 g/dL (ref 11.6–15.9)
LYMPH%: 10.2 % — ABNORMAL LOW (ref 14.0–49.7)
MCH: 30 pg (ref 25.1–34.0)
MCHC: 33.6 g/dL (ref 31.5–36.0)
MONO#: 0.4 10*3/uL (ref 0.1–0.9)
NEUT#: 6.2 10*3/uL (ref 1.5–6.5)
NEUT%: 83.1 % — ABNORMAL HIGH (ref 38.4–76.8)
RDW: 14.2 % (ref 11.2–14.5)
WBC: 7.5 10*3/uL (ref 3.9–10.3)
lymph#: 0.8 10*3/uL — ABNORMAL LOW (ref 0.9–3.3)

## 2013-11-24 LAB — COMPREHENSIVE METABOLIC PANEL (CC13)
AST: 14 U/L (ref 5–34)
Albumin: 3.8 g/dL (ref 3.5–5.0)
Anion Gap: 10 mEq/L (ref 3–11)
BUN: 12.6 mg/dL (ref 7.0–26.0)
CO2: 26 mEq/L (ref 22–29)
Calcium: 9.4 mg/dL (ref 8.4–10.4)
Chloride: 108 mEq/L (ref 98–109)
Creatinine: 0.7 mg/dL (ref 0.6–1.1)
Potassium: 4.3 mEq/L (ref 3.5–5.1)
Sodium: 144 mEq/L (ref 136–145)

## 2013-11-24 MED ORDER — DOXORUBICIN HCL CHEMO IV INJECTION 2 MG/ML
60.0000 mg/m2 | Freq: Once | INTRAVENOUS | Status: AC
  Administered 2013-11-24: 120 mg via INTRAVENOUS
  Filled 2013-11-24: qty 60

## 2013-11-24 MED ORDER — HEPARIN SOD (PORK) LOCK FLUSH 100 UNIT/ML IV SOLN
500.0000 [IU] | Freq: Once | INTRAVENOUS | Status: AC | PRN
  Administered 2013-11-24: 500 [IU]
  Filled 2013-11-24: qty 5

## 2013-11-24 MED ORDER — PALONOSETRON HCL INJECTION 0.25 MG/5ML
0.2500 mg | Freq: Once | INTRAVENOUS | Status: AC
  Administered 2013-11-24: 0.25 mg via INTRAVENOUS

## 2013-11-24 MED ORDER — DEXAMETHASONE SODIUM PHOSPHATE 20 MG/5ML IJ SOLN
12.0000 mg | Freq: Once | INTRAMUSCULAR | Status: AC
  Administered 2013-11-24: 12 mg via INTRAVENOUS

## 2013-11-24 MED ORDER — SODIUM CHLORIDE 0.9 % IV SOLN
Freq: Once | INTRAVENOUS | Status: AC
  Administered 2013-11-24: 13:00:00 via INTRAVENOUS

## 2013-11-24 MED ORDER — SODIUM CHLORIDE 0.9 % IV SOLN
150.0000 mg | Freq: Once | INTRAVENOUS | Status: AC
  Administered 2013-11-24: 150 mg via INTRAVENOUS
  Filled 2013-11-24: qty 5

## 2013-11-24 MED ORDER — DEXAMETHASONE SODIUM PHOSPHATE 20 MG/5ML IJ SOLN
INTRAMUSCULAR | Status: AC
  Filled 2013-11-24: qty 5

## 2013-11-24 MED ORDER — SODIUM CHLORIDE 0.9 % IV SOLN
600.0000 mg/m2 | Freq: Once | INTRAVENOUS | Status: AC
  Administered 2013-11-24: 1200 mg via INTRAVENOUS
  Filled 2013-11-24: qty 60

## 2013-11-24 MED ORDER — SODIUM CHLORIDE 0.9 % IJ SOLN
10.0000 mL | INTRAMUSCULAR | Status: DC | PRN
  Administered 2013-11-24: 10 mL
  Filled 2013-11-24: qty 10

## 2013-11-24 MED ORDER — PALONOSETRON HCL INJECTION 0.25 MG/5ML
INTRAVENOUS | Status: AC
  Filled 2013-11-24: qty 5

## 2013-11-24 MED ORDER — CIPROFLOXACIN HCL 500 MG PO TABS: 500.0000 mg | ORAL_TABLET | Freq: Two times a day (BID) | ORAL | Status: DC

## 2013-11-24 NOTE — Patient Instructions (Signed)
[facility] Discharge Instructions for Patients Receiving Chemotherapy  Today you received the following chemotherapy agents Cytoxan and Adriamycin.  To help prevent nausea and vomiting after your treatment, we encourage you to take your nausea medication as prescribed.   If you develop nausea and vomiting that is not controlled by your nausea medication, call the clinic.   BELOW ARE SYMPTOMS THAT SHOULD BE REPORTED IMMEDIATELY:  *FEVER GREATER THAN 100.5 F  *CHILLS WITH OR WITHOUT FEVER  NAUSEA AND VOMITING THAT IS NOT CONTROLLED WITH YOUR NAUSEA MEDICATION  *UNUSUAL SHORTNESS OF BREATH  *UNUSUAL BRUISING OR BLEEDING  TENDERNESS IN MOUTH AND THROAT WITH OR WITHOUT PRESENCE OF ULCERS  *URINARY PROBLEMS  *BOWEL PROBLEMS  UNUSUAL RASH Items with * indicate a potential emergency and should be followed up as soon as possible.  Feel free to call the clinic you have any questions or concerns. The clinic phone number is [phone].

## 2013-11-24 NOTE — Progress Notes (Signed)
[facility]  Telephone:(336) [phone] Fax:(336) [phone]  OFFICE PROGRESS NOTE   ID: Natalie Arias   DOB: 09/15/1961  MR#: [id number]  ONG#:[id number]   PCP: Lovenia Kim, PA-C SU: Claud Kelp, MD RAD ONC:  Dorothy Puffer, MD   DIAGNOSES: Natalie Arias is a 52 y.o. 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   STAGE:  Breast cancer of lower-inner quadrant of right female breast   Primary site: Breast (Right)   Staging method: AJCC 7th Edition   Clinical: Stage IIA (T2, N0, cM0)   Summary: Stage IIA (T2, N0, cM0)   PRIOR THERAPY: #1 Right breast needle core biopsy performed on 09/30/2013 resulting in pathology that revealed an invasive mammary carcinoma likely ductal phenotype, intermediate grade, with a tumor that is estrogen receptor negative, progesterone receptor negative, HER-2/neu negative, with a Ki-67 80%.  #2 Bilateral breast MRI on 10/11/2013 showed within the right breast there was an irregularly marginated enhancing mass with central necrosis located in the upper-inner quadrant at the 2 o'clock position (the middle 1/3).  The mass measures 3.9 x 3.7 x 3.1 cm in size and was associated with clip artifact.  There were no additional areas of worrisome enhancement within the right breast.  The left breast did not have mass or abnormal enhancement.  No abnormal appearing lymph nodes (clinical stage IIA T2 N0).  #3 Patient was recommended neoadjuvant chemotherapy utilizing Adriamycin/Cytoxan x 4 cycles with neulasta support followed by Taxol/Carbo weekly x 12 weeks.       CURRENT THERAPY: Adriamycin/Cytoxan cycle 3 day 1  INTERVAL HISTORY: Natalie Arias was seen today for evaluation prior to her third cycle of Adriamycin/Cytoxan.   She is doing well today.  She denies fevers, chills, nausea, vomiting, constipation, diarrhea, numbness or  any other concerns.  She is going to the [employer].  Otherwise, a 10 point ROS is neg.   PAST MEDICAL HISTORY: Past Medical History  Diagnosis Date  . Asthma 20's    allergic asthema  . Seasonal allergies   . PONV (postoperative nausea and vomiting)   . Breast cancer dx'd 10/01/2013    right    PAST SURGICAL HISTORY: Past Surgical History  Procedure Laterality Date  . Laparoscopic endometriosis fulguration  1991  . Abdominal hysterectomy  1992  . Portacath placement N/A 10/18/2013    Procedure: INSERTION PORT-A-CATH WITH ULTRASOUND;  Surgeon: Ernestene Mention, MD;  Location: [facility];  Service: General;  Laterality: N/A;    FAMILY HISTORY: Family History  Problem Relation Age of Onset  . Lung cancer Maternal Uncle   . Breast cancer Paternal Aunt   . Breast cancer Maternal Grandmother   . Lung cancer Maternal Uncle   . Hypertension Father   . Heart disease Father   . Parkinsonism Father   . Cancer Father     Skin Cancer  . Hypercholesterolemia Mother   . Gallstones Mother   . Healthy Brother     SOCIAL HISTORY: History  Substance Use Topics  . Smoking status: Never Smoker   . Smokeless tobacco: Never Used  . Alcohol Use: Yes     Comment: once a month    ALLERGIES: Allergies  Allergen Reactions  . Penicillins Hives    CURRENT MEDICATIONS: Current Outpatient Prescriptions  Medication Sig Dispense Refill  . dexamethasone (DECADRON) 4 MG tablet Take 2 tablets by mouth once a day on the  day after chemotherapy and then take 2 tablets two times a day for 2 days. Take with food.  30 tablet  1  . hydrocortisone (ANUSOL-HC) 2.5 % rectal cream Place 1 application rectally 2 (two) times daily.  30 g  0  . ibuprofen (ADVIL,MOTRIN) 200 MG tablet Take 200 mg by mouth every 6 (six) hours as needed for pain.      Marland Kitchen lidocaine-prilocaine (EMLA) cream Apply topically as needed. To port-a-cath 1.5 hours before chemotherapy and cover  30 g  1  . ondansetron (ZOFRAN) 8 MG  tablet Take 1 tablet (8 mg total) by mouth 2 (two) times daily as needed. Take two times a day as needed for nausea or vomiting starting on the third day after chemotherapy.  30 tablet  1  . prochlorperazine (COMPAZINE) 10 MG tablet Take 1 tablet (10 mg total) by mouth every 6 (six) hours as needed (Nausea or vomiting).  30 tablet  1  . UNABLE TO FIND 1 each by Other route as needed. Cranial Prosthesis      . aspirin 81 MG tablet Take 81 mg by mouth every morning.       . ciprofloxacin (CIPRO) 500 MG tablet Take 1 tablet (500 mg total) by mouth 2 (two) times daily.  14 tablet  3  . HYDROcodone-acetaminophen (NORCO/VICODIN) 5-325 MG per tablet Take 1-2 tablets by mouth every 4 (four) hours as needed for pain.  30 tablet  0  . LORazepam (ATIVAN) 0.5 MG tablet Take 1 tablet (0.5 mg total) by mouth every 6 (six) hours as needed (Nausea or vomiting).  30 tablet  0   No current facility-administered medications for this visit.   REVIEW OF SYSTEMS:  A 10 point review of systems was conducted and is otherwise negative except for what is noted above.    PHYSICAL EXAMINATION: Blood pressure 110/75, pulse 86, temperature 99.9 F (37.7 C), temperature source Oral, resp. rate 18, height 5\' 6"  (1.676 m), weight 191 lb 9.6 oz (86.909 kg). General: Patient is a well appearing female in no acute distress HEENT: PERRLA, sclerae anicteric no conjunctival pallor, MMM Neck: supple, no palpable adenopathy Lungs: clear to auscultation bilaterally, no wheezes, rhonchi, or rales Cardiovascular: regular rate rhythm, S1, S2, no murmurs, rubs or gallops Abdomen: Soft, non-tender, non-distended, normoactive bowel sounds, no HSM Extremities: warm and well perfused, no clubbing, cyanosis, or edema Skin: No rashes or lesions Neuro: Non-focal Breasts: right breast with approximately 3cm palpable soft mass ECOG FS: 1 - Symptomatic but completely ambulatory  LABORATORIES:   Chemistry      Component Value Date/Time   NA  144 11/24/2013 1126   NA 140 10/14/2013 0930   K 4.3 11/24/2013 1126   K 4.7 10/14/2013 0930   CL 105 10/14/2013 0930   CO2 26 11/24/2013 1126   CO2 29 10/14/2013 0930   BUN 12.6 11/24/2013 1126   BUN 12 10/14/2013 0930   CREATININE 0.7 11/24/2013 1126   CREATININE 0.71 10/14/2013 0930      Component Value Date/Time   CALCIUM 9.4 11/24/2013 1126   CALCIUM 9.6 10/14/2013 0930   ALKPHOS 80 11/24/2013 1126   ALKPHOS 70 10/14/2013 0930   AST 14 11/24/2013 1126   AST 16 10/14/2013 0930   ALT 26 11/24/2013 1126   ALT 18 10/14/2013 0930   BILITOT 0.37 11/24/2013 1126   BILITOT 0.3 10/14/2013 0930      Lab Results  Component Value Date   WBC 7.5 11/24/2013   HGB  11.7 11/24/2013   HCT 34.8 11/24/2013   MCV 89.2 11/24/2013   PLT 126* 11/24/2013    STUDIES/IMAGING: 1.  2-D echocardiogram on 10/11/2013 showed a left ventricular ejection fraction of 60% - 65%.    ASSESSMENT: Natalie Arias is a 52 y.o. female diagnosed with triple negative invasive ductal carcinoma of the right breast in 09/2013:  #1 Intermediate grade invasive ductal carcinoma of the right breast. The mass measures 3.5 cm by ultrasound. Prognostic markers revealed the tumor to be estrogen receptor negative, progesterone receptor negative, HER-2/neu negative, with an elevated proliferation marker Ki-67 of 80%.   #2  Bilateral breast MRI on 10/11/2013 showed within the right breast there was an irregularly marginated enhancing mass with central necrosis located in the upper-inner quadrant at the 2 o'clock position (the middle 1/3).  The mass measures 3.9 x 3.7 x 3.1 cm in size and was associated with clip artifact.  There were no additional areas of worrisome enhancement within the right breast.  The left breast did not have mass or abnormal enhancement.  No abnormal appearing lymph nodes (clinical stage IIA T2 N0).    #3  Started neoadjuvant chemotherapy of dose dense Adriamycin/Cytoxan with 4 planned doses on  10/27/2013.  Day 2 granulocyte support with [name] injection.  This is scheduled to be followed by neoadjuvant chemotherapy consisting of Taxol/carbo weekly x 12 weeks.  #4  If patient does successfully undergo lumpectomy she will need radiation therapy.  #5 Patient has a family history of breast cancer and herself has triple negative breast cancer and is scheduled to be seen by the genetic counselor on 01/06/2014.  PLAN:   #1 Patient is doing well.  I reviewed her CBC with her and she will proceed with chemotherapy today.    #2 since she is going out of town she will start Cipro on Monday, 11/29/13.    #3  She will f/u with [place] on 12/03/13.    All questions answered.  Mrs. Cudney was encouraged to contact us in the interim with any questions, concerns, or problems.  I spent 25 minutes counseling the patient face to face.  The total time spent in the appointment was 30 minutes.  Illa Level, NP Medical Oncology [facility] [phone] 11/26/2013  10:30 AM

## 2013-11-26 ENCOUNTER — Ambulatory Visit (HOSPITAL_BASED_OUTPATIENT_CLINIC_OR_DEPARTMENT_OTHER): Payer: BC Managed Care – PPO

## 2013-11-26 VITALS — BP 126/62 | HR 91 | Temp 97.8°F

## 2013-11-26 DIAGNOSIS — Z5189 Encounter for other specified aftercare: Secondary | ICD-10-CM

## 2013-11-26 DIAGNOSIS — C50311 Malignant neoplasm of lower-inner quadrant of right female breast: Secondary | ICD-10-CM

## 2013-11-26 DIAGNOSIS — C50319 Malignant neoplasm of lower-inner quadrant of unspecified female breast: Secondary | ICD-10-CM

## 2013-11-26 MED ORDER — PEGFILGRASTIM INJECTION 6 MG/0.6ML
6.0000 mg | Freq: Once | SUBCUTANEOUS | Status: AC
  Administered 2013-11-26: 6 mg via SUBCUTANEOUS
  Filled 2013-11-26: qty 0.6

## 2013-12-02 ENCOUNTER — Other Ambulatory Visit (HOSPITAL_BASED_OUTPATIENT_CLINIC_OR_DEPARTMENT_OTHER): Payer: BC Managed Care – PPO | Admitting: Emergency Medicine

## 2013-12-02 DIAGNOSIS — C50311 Malignant neoplasm of lower-inner quadrant of right female breast: Secondary | ICD-10-CM

## 2013-12-03 ENCOUNTER — Ambulatory Visit (HOSPITAL_BASED_OUTPATIENT_CLINIC_OR_DEPARTMENT_OTHER): Payer: BC Managed Care – PPO | Admitting: Adult Health

## 2013-12-03 ENCOUNTER — Encounter: Payer: Self-pay | Admitting: Adult Health

## 2013-12-03 ENCOUNTER — Other Ambulatory Visit: Payer: BC Managed Care – PPO | Admitting: Lab

## 2013-12-03 VITALS — BP 111/71 | HR 98 | Temp 99.3°F | Resp 18 | Ht 66.0 in | Wt 194.9 lb

## 2013-12-03 DIAGNOSIS — Z171 Estrogen receptor negative status [ER-]: Secondary | ICD-10-CM

## 2013-12-03 DIAGNOSIS — C50319 Malignant neoplasm of lower-inner quadrant of unspecified female breast: Secondary | ICD-10-CM

## 2013-12-03 DIAGNOSIS — C50311 Malignant neoplasm of lower-inner quadrant of right female breast: Secondary | ICD-10-CM

## 2013-12-03 DIAGNOSIS — Z803 Family history of malignant neoplasm of breast: Secondary | ICD-10-CM

## 2013-12-03 LAB — COMPREHENSIVE METABOLIC PANEL (CC13)
ALT: 25 U/L (ref 0–55)
Albumin: 3.6 g/dL (ref 3.5–5.0)
Alkaline Phosphatase: 82 U/L (ref 40–150)
Anion Gap: 9 mEq/L (ref 3–11)
CO2: 26 mEq/L (ref 22–29)
Creatinine: 0.7 mg/dL (ref 0.6–1.1)
Glucose: 183 mg/dl — ABNORMAL HIGH (ref 70–140)
Potassium: 4.1 mEq/L (ref 3.5–5.1)
Sodium: 141 mEq/L (ref 136–145)
Total Protein: 6.1 g/dL — ABNORMAL LOW (ref 6.4–8.3)

## 2013-12-03 LAB — CBC WITH DIFFERENTIAL/PLATELET
Basophils Absolute: 0.1 10*3/uL (ref 0.0–0.1)
EOS%: 0.2 % (ref 0.0–7.0)
Eosinophils Absolute: 0 10*3/uL (ref 0.0–0.5)
HCT: 30.4 % — ABNORMAL LOW (ref 34.8–46.6)
HGB: 10.7 g/dL — ABNORMAL LOW (ref 11.6–15.9)
LYMPH%: 10.8 % — ABNORMAL LOW (ref 14.0–49.7)
MCH: 31.3 pg (ref 25.1–34.0)
MONO#: 0.5 10*3/uL (ref 0.1–0.9)
NEUT#: 4 10*3/uL (ref 1.5–6.5)
Platelets: 127 10*3/uL — ABNORMAL LOW (ref 145–400)
RBC: 3.42 10*6/uL — ABNORMAL LOW (ref 3.70–5.45)
RDW: 14.5 % (ref 11.2–14.5)
WBC: 5.1 10*3/uL (ref 3.9–10.3)

## 2013-12-03 NOTE — Progress Notes (Signed)
[facility]  Telephone:(336) [phone] Fax:(336) [phone]  OFFICE PROGRESS NOTE   ID: Natalie Arias   DOB: 08-20-1961  MR#: [id number]  JYN#:[id number]   PCP: Lovenia Kim, PA-C SU: Claud Kelp, MD RAD ONC:  Dorothy Puffer, MD   DIAGNOSES: Natalie Arias is a 52 y.o. 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   STAGE:  Breast cancer of lower-inner quadrant of right female breast   Primary site: Breast (Right)   Staging method: AJCC 7th Edition   Clinical: Stage IIA (T2, N0, cM0)   Summary: Stage IIA (T2, N0, cM0)   PRIOR THERAPY: #1 Right breast needle core biopsy performed on 09/30/2013 resulting in pathology that revealed an invasive mammary carcinoma likely ductal phenotype, intermediate grade, with a tumor that is estrogen receptor negative, progesterone receptor negative, HER-2/neu negative, with a Ki-67 80%.  #2 Bilateral breast MRI on 10/11/2013 showed within the right breast there was an irregularly marginated enhancing mass with central necrosis located in the upper-inner quadrant at the 2 o'clock position (the middle 1/3).  The mass measures 3.9 x 3.7 x 3.1 cm in size and was associated with clip artifact.  There were no additional areas of worrisome enhancement within the right breast.  The left breast did not have mass or abnormal enhancement.  No abnormal appearing lymph nodes (clinical stage IIA T2 N0).  #3 Patient was recommended neoadjuvant chemotherapy utilizing Adriamycin/Cytoxan x 4 cycles with neulasta support followed by Taxol/Carbo weekly x 12 weeks.       CURRENT THERAPY: Adriamycin/Cytoxan cycle 3 day 8  INTERVAL HISTORY: Natalie Arias was seen today for evaluation following her third cycle of Adriamycin/Cytoxan.   She is doing well today.  She is having difficulty with constipation.  She has taken two Senokot tablets  with no help.  She denies fevers, chills, nausea, vomiting, constipation, diarrhea, numbness, or any other concerns.    PAST MEDICAL HISTORY: Past Medical History  Diagnosis Date  . Asthma 20's    allergic asthema  . Seasonal allergies   . PONV (postoperative nausea and vomiting)   . Breast cancer dx'd 10/01/2013    right    PAST SURGICAL HISTORY: Past Surgical History  Procedure Laterality Date  . Laparoscopic endometriosis fulguration  1991  . Abdominal hysterectomy  1992  . Portacath placement N/A 10/18/2013    Procedure: INSERTION PORT-A-CATH WITH ULTRASOUND;  Surgeon: Ernestene Mention, MD;  Location: [facility];  Service: General;  Laterality: N/A;    FAMILY HISTORY: Family History  Problem Relation Age of Onset  . Lung cancer Maternal Uncle   . Breast cancer Paternal Aunt   . Breast cancer Maternal Grandmother   . Lung cancer Maternal Uncle   . Hypertension Father   . Heart disease Father   . Parkinsonism Father   . Cancer Father     Skin Cancer  . Hypercholesterolemia Mother   . Gallstones Mother   . Healthy Brother     SOCIAL HISTORY: History  Substance Use Topics  . Smoking status: Never Smoker   . Smokeless tobacco: Never Used  . Alcohol Use: Yes     Comment: once a month    ALLERGIES: Allergies  Allergen Reactions  . Penicillins Hives    CURRENT MEDICATIONS: Current Outpatient Prescriptions  Medication Sig Dispense Refill  . aspirin 81 MG tablet Take 81 mg by mouth every morning.       Marland Kitchen  ciprofloxacin (CIPRO) 500 MG tablet Take 1 tablet (500 mg total) by mouth 2 (two) times daily.  14 tablet  3  . dexamethasone (DECADRON) 4 MG tablet Take 2 tablets by mouth once a day on the day after chemotherapy and then take 2 tablets two times a day for 2 days. Take with food.  30 tablet  1  . HYDROcodone-acetaminophen (NORCO/VICODIN) 5-325 MG per tablet Take 1-2 tablets by mouth every 4 (four) hours as needed for pain.  30 tablet  0  . hydrocortisone (ANUSOL-HC)  2.5 % rectal cream Place 1 application rectally 2 (two) times daily.  30 g  0  . ibuprofen (ADVIL,MOTRIN) 200 MG tablet Take 200 mg by mouth every 6 (six) hours as needed for pain.      Marland Kitchen lidocaine-prilocaine (EMLA) cream Apply topically as needed. To port-a-cath 1.5 hours before chemotherapy and cover  30 g  1  . LORazepam (ATIVAN) 0.5 MG tablet Take 1 tablet (0.5 mg total) by mouth every 6 (six) hours as needed (Nausea or vomiting).  30 tablet  0  . ondansetron (ZOFRAN) 8 MG tablet Take 1 tablet (8 mg total) by mouth 2 (two) times daily as needed. Take two times a day as needed for nausea or vomiting starting on the third day after chemotherapy.  30 tablet  1  . prochlorperazine (COMPAZINE) 10 MG tablet Take 1 tablet (10 mg total) by mouth every 6 (six) hours as needed (Nausea or vomiting).  30 tablet  1  . SENNA PO Take by mouth.      Marland Kitchen UNABLE TO FIND 1 each by Other route as needed. Cranial Prosthesis       No current facility-administered medications for this visit.   REVIEW OF SYSTEMS:  A 10 point review of systems was conducted and is otherwise negative except for what is noted above.    PHYSICAL EXAMINATION: Blood pressure 111/71, pulse 98, temperature 99.3 F (37.4 C), temperature source Oral, resp. rate 18, height 5\' 6"  (1.676 m), weight 194 lb 14.4 oz (88.406 kg). General: Patient is a well appearing female in no acute distress HEENT: PERRLA, sclerae anicteric no conjunctival pallor, MMM Neck: supple, no palpable adenopathy Lungs: clear to auscultation bilaterally, no wheezes, rhonchi, or rales Cardiovascular: regular rate rhythm, S1, S2, no murmurs, rubs or gallops Abdomen: Soft, non-tender, non-distended, normoactive bowel sounds, no HSM Extremities: warm and well perfused, no clubbing, cyanosis, or edema Skin: No rashes or lesions Neuro: Non-focal Breasts: right breast with approximately 3cm palpable soft mass ECOG FS: 1 - Symptomatic but completely  ambulatory  LABORATORIES:   Chemistry      Component Value Date/Time   NA 141 12/03/2013 1333   NA 140 10/14/2013 0930   K 4.1 12/03/2013 1333   K 4.7 10/14/2013 0930   CL 105 10/14/2013 0930   CO2 26 12/03/2013 1333   CO2 29 10/14/2013 0930   BUN 7.7 12/03/2013 1333   BUN 12 10/14/2013 0930   CREATININE 0.7 12/03/2013 1333   CREATININE 0.71 10/14/2013 0930      Component Value Date/Time   CALCIUM 9.4 12/03/2013 1333   CALCIUM 9.6 10/14/2013 0930   ALKPHOS 82 12/03/2013 1333   ALKPHOS 70 10/14/2013 0930   AST 16 12/03/2013 1333   AST 16 10/14/2013 0930   ALT 25 12/03/2013 1333   ALT 18 10/14/2013 0930   BILITOT 0.26 12/03/2013 1333   BILITOT 0.3 10/14/2013 0930      Lab Results  Component Value Date  WBC 5.1 12/03/2013   HGB 10.7* 12/03/2013   HCT 30.4* 12/03/2013   MCV 89.0 12/03/2013   PLT 127* 12/03/2013    STUDIES/IMAGING: 1.  2-D echocardiogram on 10/11/2013 showed a left ventricular ejection fraction of 60% - 65%.    ASSESSMENT: Natalie Arias is a 52 y.o. female diagnosed with triple negative invasive ductal carcinoma of the right breast in 09/2013:  #1 Intermediate grade invasive ductal carcinoma of the right breast. The mass measures 3.5 cm by ultrasound. Prognostic markers revealed the tumor to be estrogen receptor negative, progesterone receptor negative, HER-2/neu negative, with an elevated proliferation marker Ki-67 of 80%.   #2  Bilateral breast MRI on 10/11/2013 showed within the right breast there was an irregularly marginated enhancing mass with central necrosis located in the upper-inner quadrant at the 2 o'clock position (the middle 1/3).  The mass measures 3.9 x 3.7 x 3.1 cm in size and was associated with clip artifact.  There were no additional areas of worrisome enhancement within the right breast.  The left breast did not have mass or abnormal enhancement.  No abnormal appearing lymph nodes (clinical stage IIA T2 N0).    #3  Started neoadjuvant  chemotherapy of dose dense Adriamycin/Cytoxan with 4 planned doses on 10/27/2013.  Day 2 granulocyte support with [name] injection.  This is scheduled to be followed by neoadjuvant chemotherapy consisting of Taxol/carbo weekly x 12 weeks.  #4  If patient does successfully undergo lumpectomy she will need radiation therapy.  #5 Patient has a family history of breast cancer and herself has triple negative breast cancer and is scheduled to be seen by the genetic counselor on 01/06/2014.  PLAN:   #1 Patient is doing well today.  I reviewed her labs with her in detail.  She is not neutropenic.    #2 I recommended she take 2 senokot tablets BID, and add Miralax if needed.    #3 She will return next week for cycle 4 of Adriamycin/Cytoxan, I also requested appointments be scheduled for Taxol carbo beginning 12/29/13.    All questions answered.  Mrs. Gruszka was encouraged to contact us in the interim with any questions, concerns, or problems.  I spent 25 minutes counseling the patient face to face.  The total time spent in the appointment was 30 minutes.  Illa Level, NP Medical Oncology [facility] [phone] 12/03/2013  3:02 PM

## 2013-12-03 NOTE — Telephone Encounter (Signed)
appts made and printed. Pt is aware that tx will be added. i emailed MW to add the tx's...td 

## 2013-12-06 ENCOUNTER — Telehealth: Payer: Self-pay | Admitting: *Deleted

## 2013-12-06 NOTE — Telephone Encounter (Signed)
Per staff message and POF I have scheduled appts. MD appt on 12/31 to late in the afternoon to start first time treatment. Advised scheduler  JMW

## 2013-12-08 ENCOUNTER — Encounter: Payer: Self-pay | Admitting: Adult Health

## 2013-12-08 ENCOUNTER — Ambulatory Visit (HOSPITAL_BASED_OUTPATIENT_CLINIC_OR_DEPARTMENT_OTHER): Payer: BC Managed Care – PPO

## 2013-12-08 ENCOUNTER — Other Ambulatory Visit: Payer: Self-pay | Admitting: *Deleted

## 2013-12-08 ENCOUNTER — Ambulatory Visit: Payer: BC Managed Care – PPO

## 2013-12-08 ENCOUNTER — Ambulatory Visit (HOSPITAL_BASED_OUTPATIENT_CLINIC_OR_DEPARTMENT_OTHER): Payer: BC Managed Care – PPO | Admitting: Adult Health

## 2013-12-08 ENCOUNTER — Other Ambulatory Visit (HOSPITAL_BASED_OUTPATIENT_CLINIC_OR_DEPARTMENT_OTHER): Payer: BC Managed Care – PPO

## 2013-12-08 ENCOUNTER — Encounter: Payer: Self-pay | Admitting: Oncology

## 2013-12-08 VITALS — BP 117/76 | HR 94 | Temp 97.1°F | Resp 18 | Ht 66.0 in | Wt 196.1 lb

## 2013-12-08 DIAGNOSIS — C50219 Malignant neoplasm of upper-inner quadrant of unspecified female breast: Secondary | ICD-10-CM

## 2013-12-08 DIAGNOSIS — Z803 Family history of malignant neoplasm of breast: Secondary | ICD-10-CM

## 2013-12-08 DIAGNOSIS — C50311 Malignant neoplasm of lower-inner quadrant of right female breast: Secondary | ICD-10-CM

## 2013-12-08 DIAGNOSIS — D702 Other drug-induced agranulocytosis: Secondary | ICD-10-CM

## 2013-12-08 DIAGNOSIS — Z5111 Encounter for antineoplastic chemotherapy: Secondary | ICD-10-CM

## 2013-12-08 DIAGNOSIS — Z171 Estrogen receptor negative status [ER-]: Secondary | ICD-10-CM

## 2013-12-08 DIAGNOSIS — C50319 Malignant neoplasm of lower-inner quadrant of unspecified female breast: Secondary | ICD-10-CM

## 2013-12-08 LAB — CBC WITH DIFFERENTIAL/PLATELET
BASO%: 0.3 % (ref 0.0–2.0)
EOS%: 0.1 % (ref 0.0–7.0)
Eosinophils Absolute: 0 10*3/uL (ref 0.0–0.5)
HCT: 33.3 % — ABNORMAL LOW (ref 34.8–46.6)
HGB: 11 g/dL — ABNORMAL LOW (ref 11.6–15.9)
LYMPH%: 11.9 % — ABNORMAL LOW (ref 14.0–49.7)
MCH: 30.1 pg (ref 25.1–34.0)
MCHC: 33 g/dL (ref 31.5–36.0)
MONO#: 0.7 10*3/uL (ref 0.1–0.9)
NEUT#: 5.3 10*3/uL (ref 1.5–6.5)
NEUT%: 78.2 % — ABNORMAL HIGH (ref 38.4–76.8)
RBC: 3.65 10*6/uL — ABNORMAL LOW (ref 3.70–5.45)
RDW: 16.6 % — ABNORMAL HIGH (ref 11.2–14.5)
WBC: 6.8 10*3/uL (ref 3.9–10.3)
lymph#: 0.8 10*3/uL — ABNORMAL LOW (ref 0.9–3.3)

## 2013-12-08 LAB — COMPREHENSIVE METABOLIC PANEL (CC13)
ALT: 20 U/L (ref 0–55)
AST: 14 U/L (ref 5–34)
Anion Gap: 10 mEq/L (ref 3–11)
CO2: 27 mEq/L (ref 22–29)
Chloride: 107 mEq/L (ref 98–109)
Creatinine: 0.7 mg/dL (ref 0.6–1.1)
Sodium: 144 mEq/L (ref 136–145)
Total Bilirubin: 0.34 mg/dL (ref 0.20–1.20)
Total Protein: 6.5 g/dL (ref 6.4–8.3)

## 2013-12-08 MED ORDER — DEXAMETHASONE SODIUM PHOSPHATE 20 MG/5ML IJ SOLN
INTRAMUSCULAR | Status: AC
  Filled 2013-12-08: qty 5

## 2013-12-08 MED ORDER — DOXORUBICIN HCL CHEMO IV INJECTION 2 MG/ML
60.0000 mg/m2 | Freq: Once | INTRAVENOUS | Status: AC
  Administered 2013-12-08: 120 mg via INTRAVENOUS
  Filled 2013-12-08: qty 60

## 2013-12-08 MED ORDER — CIPROFLOXACIN HCL 500 MG PO TABS: 500.0000 mg | ORAL_TABLET | Freq: Two times a day (BID) | ORAL | Status: DC

## 2013-12-08 MED ORDER — SODIUM CHLORIDE 0.9 % IV SOLN
150.0000 mg | Freq: Once | INTRAVENOUS | Status: AC
  Administered 2013-12-08: 150 mg via INTRAVENOUS
  Filled 2013-12-08: qty 5

## 2013-12-08 MED ORDER — SODIUM CHLORIDE 0.9 % IV SOLN
Freq: Once | INTRAVENOUS | Status: AC
  Administered 2013-12-08: 10:00:00 via INTRAVENOUS

## 2013-12-08 MED ORDER — SODIUM CHLORIDE 0.9 % IV SOLN
600.0000 mg/m2 | Freq: Once | INTRAVENOUS | Status: AC
  Administered 2013-12-08: 1200 mg via INTRAVENOUS
  Filled 2013-12-08: qty 60

## 2013-12-08 MED ORDER — HEPARIN SOD (PORK) LOCK FLUSH 100 UNIT/ML IV SOLN
500.0000 [IU] | Freq: Once | INTRAVENOUS | Status: AC | PRN
  Administered 2013-12-08: 500 [IU]
  Filled 2013-12-08: qty 5

## 2013-12-08 MED ORDER — PALONOSETRON HCL INJECTION 0.25 MG/5ML
INTRAVENOUS | Status: AC
  Filled 2013-12-08: qty 5

## 2013-12-08 MED ORDER — DEXAMETHASONE SODIUM PHOSPHATE 20 MG/5ML IJ SOLN
12.0000 mg | Freq: Once | INTRAMUSCULAR | Status: AC
  Administered 2013-12-08: 12 mg via INTRAVENOUS

## 2013-12-08 MED ORDER — SODIUM CHLORIDE 0.9 % IJ SOLN
10.0000 mL | INTRAMUSCULAR | Status: DC | PRN
  Administered 2013-12-08: 10 mL
  Filled 2013-12-08: qty 10

## 2013-12-08 MED ORDER — PALONOSETRON HCL INJECTION 0.25 MG/5ML
0.2500 mg | Freq: Once | INTRAVENOUS | Status: AC
  Administered 2013-12-08: 0.25 mg via INTRAVENOUS

## 2013-12-08 NOTE — Patient Instructions (Signed)
[facility] Discharge Instructions for Patients Receiving Chemotherapy  Today you received the following chemotherapy agents Adriamycin/Cytoxan To help prevent nausea and vomiting after your treatment, we encourage you to take your nausea medication as prescribed. If you develop nausea and vomiting that is not controlled by your nausea medication, call the clinic.   BELOW ARE SYMPTOMS THAT SHOULD BE REPORTED IMMEDIATELY:  *FEVER GREATER THAN 100.5 F  *CHILLS WITH OR WITHOUT FEVER  NAUSEA AND VOMITING THAT IS NOT CONTROLLED WITH YOUR NAUSEA MEDICATION  *UNUSUAL SHORTNESS OF BREATH  *UNUSUAL BRUISING OR BLEEDING  TENDERNESS IN MOUTH AND THROAT WITH OR WITHOUT PRESENCE OF ULCERS  *URINARY PROBLEMS  *BOWEL PROBLEMS  UNUSUAL RASH Items with * indicate a potential emergency and should be followed up as soon as possible.  Feel free to call the clinic you have any questions or concerns. The clinic phone number is [phone].    

## 2013-12-08 NOTE — Progress Notes (Addendum)
[facility]  Telephone:(336) [phone] Fax:(336) [phone]  OFFICE PROGRESS NOTE   ID: Natalie Arias   DOB: 03/15/1961  MR#: [id number]  JYN#:[id number]   PCP: Lovenia Kim, PA-C SU: Claud Kelp, MD RAD ONC:  Dorothy Puffer, MD   DIAGNOSES: Natalie Arias is a 52 y.o. 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   STAGE:  Breast cancer of lower-inner quadrant of right female breast   Primary site: Breast (Right)   Staging method: AJCC 7th Edition   Clinical: Stage IIA (T2, N0, cM0)   Summary: Stage IIA (T2, N0, cM0)   PRIOR THERAPY: #1 Right breast needle core biopsy performed on 09/30/2013 resulting in pathology that revealed an invasive mammary carcinoma likely ductal phenotype, intermediate grade, with a tumor that is estrogen receptor negative, progesterone receptor negative, HER-2/neu negative, with a Ki-67 80%.  #2 Bilateral breast MRI on 10/11/2013 showed within the right breast there was an irregularly marginated enhancing mass with central necrosis located in the upper-inner quadrant at the 2 o'clock position (the middle 1/3).  The mass measures 3.9 x 3.7 x 3.1 cm in size and was associated with clip artifact.  There were no additional areas of worrisome enhancement within the right breast.  The left breast did not have mass or abnormal enhancement.  No abnormal appearing lymph nodes (clinical stage IIA T2 N0).  #3 Patient was recommended neoadjuvant chemotherapy utilizing Adriamycin/Cytoxan x 4 cycles with neulasta support followed by Taxol/Carbo weekly x 12 weeks.       CURRENT THERAPY: Adriamycin/Cytoxan cycle 4 day 1  INTERVAL HISTORY: Natalie Arias was seen today for evaluation prior to her fourth and final cycle of Adriamycin/Cytoxan.  She is having some difficulty with sleeping, but otherwise is doing well.  She denies fevers,  chills, nausea, vomiting, constipation, diarrhea, numbness or any other concerns.  She is going out of town next weekend and wants to go ahead and pick up her Cipro as a precaution.    PAST MEDICAL HISTORY: Past Medical History  Diagnosis Date  . Asthma 20's    allergic asthema  . Seasonal allergies   . PONV (postoperative nausea and vomiting)   . Breast cancer dx'd 10/01/2013    right    PAST SURGICAL HISTORY: Past Surgical History  Procedure Laterality Date  . Laparoscopic endometriosis fulguration  1991  . Abdominal hysterectomy  1992  . Portacath placement N/A 10/18/2013    Procedure: INSERTION PORT-A-CATH WITH ULTRASOUND;  Surgeon: Ernestene Mention, MD;  Location: [facility];  Service: General;  Laterality: N/A;    FAMILY HISTORY: Family History  Problem Relation Age of Onset  . Lung cancer Maternal Uncle   . Breast cancer Paternal Aunt   . Breast cancer Maternal Grandmother   . Lung cancer Maternal Uncle   . Hypertension Father   . Heart disease Father   . Parkinsonism Father   . Cancer Father     Skin Cancer  . Hypercholesterolemia Mother   . Gallstones Mother   . Healthy Brother     SOCIAL HISTORY: History  Substance Use Topics  . Smoking status: Never Smoker   . Smokeless tobacco: Never Used  . Alcohol Use: Yes     Comment: once a month    ALLERGIES: Allergies  Allergen Reactions  . Penicillins Hives    CURRENT MEDICATIONS: Current Outpatient Prescriptions  Medication Sig Dispense Refill  .  dexamethasone (DECADRON) 4 MG tablet Take 2 tablets by mouth once a day on the day after chemotherapy and then take 2 tablets two times a day for 2 days. Take with food.  30 tablet  1  . hydrocortisone (ANUSOL-HC) 2.5 % rectal cream Place 1 application rectally 2 (two) times daily.  30 g  0  . lidocaine-prilocaine (EMLA) cream Apply topically as needed. To port-a-cath 1.5 hours before chemotherapy and cover  30 g  1  . ondansetron (ZOFRAN) 8 MG tablet Take 1 tablet  (8 mg total) by mouth 2 (two) times daily as needed. Take two times a day as needed for nausea or vomiting starting on the third day after chemotherapy.  30 tablet  1  . prochlorperazine (COMPAZINE) 10 MG tablet Take 1 tablet (10 mg total) by mouth every 6 (six) hours as needed (Nausea or vomiting).  30 tablet  1  . SENNA PO Take by mouth.      Marland Kitchen UNABLE TO FIND 1 each by Other route as needed. Cranial Prosthesis      . aspirin 81 MG tablet Take 81 mg by mouth every morning.       . ciprofloxacin (CIPRO) 500 MG tablet Take 1 tablet (500 mg total) by mouth 2 (two) times daily.  14 tablet  3  . HYDROcodone-acetaminophen (NORCO/VICODIN) 5-325 MG per tablet Take 1-2 tablets by mouth every 4 (four) hours as needed for pain.  30 tablet  0  . ibuprofen (ADVIL,MOTRIN) 200 MG tablet Take 200 mg by mouth every 6 (six) hours as needed for pain.      Marland Kitchen LORazepam (ATIVAN) 0.5 MG tablet Take 1 tablet (0.5 mg total) by mouth every 6 (six) hours as needed (Nausea or vomiting).  30 tablet  0   No current facility-administered medications for this visit.   REVIEW OF SYSTEMS:  A 10 point review of systems was conducted and is otherwise negative except for what is noted above.    PHYSICAL EXAMINATION: Blood pressure 117/76, pulse 94, temperature 97.1 F (36.2 C), temperature source Oral, resp. rate 18, height 5\' 6"  (1.676 m), weight 196 lb 1.6 oz (88.95 kg). General: Patient is a well appearing female in no acute distress HEENT: PERRLA, sclerae anicteric no conjunctival pallor, MMM Neck: supple, no palpable adenopathy Lungs: clear to auscultation bilaterally, no wheezes, rhonchi, or rales Cardiovascular: regular rate rhythm, S1, S2, no murmurs, rubs or gallops Abdomen: Soft, non-tender, non-distended, normoactive bowel sounds, no HSM Extremities: warm and well perfused, no clubbing, cyanosis, or edema Skin: No rashes or lesions Neuro: Non-focal Breasts: right breast with approximately 3cm palpable soft  mass ECOG FS: 1 - Symptomatic but completely ambulatory  LABORATORIES:   Chemistry      Component Value Date/Time   NA 141 12/03/2013 1333   NA 140 10/14/2013 0930   K 4.1 12/03/2013 1333   K 4.7 10/14/2013 0930   CL 105 10/14/2013 0930   CO2 26 12/03/2013 1333   CO2 29 10/14/2013 0930   BUN 7.7 12/03/2013 1333   BUN 12 10/14/2013 0930   CREATININE 0.7 12/03/2013 1333   CREATININE 0.71 10/14/2013 0930      Component Value Date/Time   CALCIUM 9.4 12/03/2013 1333   CALCIUM 9.6 10/14/2013 0930   ALKPHOS 82 12/03/2013 1333   ALKPHOS 70 10/14/2013 0930   AST 16 12/03/2013 1333   AST 16 10/14/2013 0930   ALT 25 12/03/2013 1333   ALT 18 10/14/2013 0930   BILITOT 0.26 12/03/2013  1333   BILITOT 0.3 10/14/2013 0930      Lab Results  Component Value Date   WBC 6.8 12/08/2013   HGB 11.0* 12/08/2013   HCT 33.3* 12/08/2013   MCV 91.2 12/08/2013   PLT 144* 12/08/2013    STUDIES/IMAGING: 1.  2-D echocardiogram on 10/11/2013 showed a left ventricular ejection fraction of 60% - 65%.    ASSESSMENT: Natalie Arias is a 52 y.o. female diagnosed with triple negative invasive ductal carcinoma of the right breast in 09/2013:  #1 Intermediate grade invasive ductal carcinoma of the right breast. The mass measures 3.5 cm by ultrasound. Prognostic markers revealed the tumor to be estrogen receptor negative, progesterone receptor negative, HER-2/neu negative, with an elevated proliferation marker Ki-67 of 80%.   #2  Bilateral breast MRI on 10/11/2013 showed within the right breast there was an irregularly marginated enhancing mass with central necrosis located in the upper-inner quadrant at the 2 o'clock position (the middle 1/3).  The mass measures 3.9 x 3.7 x 3.1 cm in size and was associated with clip artifact.  There were no additional areas of worrisome enhancement within the right breast.  The left breast did not have mass or abnormal enhancement.  No abnormal appearing lymph nodes (clinical stage  IIA T2 N0).    #3  Started neoadjuvant chemotherapy of dose dense Adriamycin/Cytoxan with 4 planned doses on 10/27/2013.  Day 2 granulocyte support with [name] injection.  This is scheduled to be followed by neoadjuvant chemotherapy consisting of Taxol/carbo weekly x 12 weeks.  #4  If patient does successfully undergo lumpectomy she will need radiation therapy.  #5 Patient has a family history of breast cancer and herself has triple negative breast cancer and is scheduled to be seen by the genetic counselor on 01/06/2014.  PLAN:   #1 Patient is doing well.  Her CBC is stable.  I reviewed it with her in detail.  She will proceed with her fourth cycle of Adriamycin/Cytoxan.    #2 I went ahead and e-scribed the Cipro for her nadir period as she is planning on a trip out of town.   #3 She will return tomorrow for [name] and next week for labs and evaluation for chemo toxicities.  I gave her information regarding taxol/carbo as this is her next chemotherapy regimen beginning 12/29/13.    #4 I requested f/u with Dr. Derrell Lolling in January when she will be halfway finished with her chemotherapy.    All questions answered.  Natalie Arias was encouraged to contact us in the interim with any questions, concerns, or problems.  I spent 25 minutes counseling the patient face to face.  The total time spent in the appointment was 30 minutes.  Illa Level, NP Medical Oncology [facility] [phone] 12/08/2013  9:28 AM  ATTENDING'S ATTESTATION:  I personally reviewed patient's chart, examined patient myself, formulated the treatment plan as followed.    Doing well continue present therapy  Drue Second, MD Medical/Oncology [facility] [phone] (beeper) [phone] (Office)  12/24/2013, 7:51 AM

## 2013-12-08 NOTE — Patient Instructions (Signed)
Paclitaxel injection What is this medicine? PACLITAXEL (PAK li TAX el) is a chemotherapy drug. It targets fast dividing cells, like cancer cells, and causes these cells to die. This medicine is used to treat ovarian cancer, breast cancer, and other cancers. This medicine may be used for other purposes; ask your health care provider or pharmacist if you have questions. COMMON BRAND NAME(S): Onxol , Taxol What should I tell my health care provider before I take this medicine? They need to know if you have any of these conditions: -blood disorders -irregular heartbeat -infection (especially a virus infection such as chickenpox, cold sores, or herpes) -liver disease -previous or ongoing radiation therapy -an unusual or allergic reaction to paclitaxel, alcohol, polyoxyethylated castor oil, other chemotherapy agents, other medicines, foods, dyes, or preservatives -pregnant or trying to get pregnant -breast-feeding How should I use this medicine? This drug is given as an infusion into a vein. It is administered in a hospital or clinic by a specially trained health care professional. Talk to your pediatrician regarding the use of this medicine in children. Special care may be needed. Overdosage: If you think you have taken too much of this medicine contact a poison control center or emergency room at once. NOTE: This medicine is only for you. Do not share this medicine with others. What if I miss a dose? It is important not to miss your dose. Call your doctor or health care professional if you are unable to keep an appointment. What may interact with this medicine? Do not take this medicine with any of the following medications: -disulfiram -metronidazole This medicine may also interact with the following medications: -cyclosporine -diazepam -ketoconazole -medicines to increase blood counts like filgrastim, pegfilgrastim, sargramostim -other chemotherapy drugs like cisplatin, doxorubicin,  epirubicin, etoposide, teniposide, vincristine -quinidine -testosterone -vaccines -verapamil Talk to your doctor or health care professional before taking any of these medicines: -acetaminophen -aspirin -ibuprofen -ketoprofen -naproxen This list may not describe all possible interactions. Give your health care provider a list of all the medicines, herbs, non-prescription drugs, or dietary supplements you use. Also tell them if you smoke, drink alcohol, or use illegal drugs. Some items may interact with your medicine. What should I watch for while using this medicine? Your condition will be monitored carefully while you are receiving this medicine. You will need important blood work done while you are taking this medicine. This drug may make you feel generally unwell. This is not uncommon, as chemotherapy can affect healthy cells as well as cancer cells. Report any side effects. Continue your course of treatment even though you feel ill unless your doctor tells you to stop. In some cases, you may be given additional medicines to help with side effects. Follow all directions for their use. Call your doctor or health care professional for advice if you get a fever, chills or sore throat, or other symptoms of a cold or flu. Do not treat yourself. This drug decreases your body's ability to fight infections. Try to avoid being around people who are sick. This medicine may increase your risk to bruise or bleed. Call your doctor or health care professional if you notice any unusual bleeding. Be careful brushing and flossing your teeth or using a toothpick because you may get an infection or bleed more easily. If you have any dental work done, tell your dentist you are receiving this medicine. Avoid taking products that contain aspirin, acetaminophen, ibuprofen, naproxen, or ketoprofen unless instructed by your doctor. These medicines may hide a   fever. Do not become pregnant while taking this medicine.  Women should inform their doctor if they wish to become pregnant or think they might be pregnant. There is a potential for serious side effects to an unborn child. Talk to your health care professional or pharmacist for more information. Do not breast-feed an infant while taking this medicine. Men are advised not to father a child while receiving this medicine. What side effects may I notice from receiving this medicine? Side effects that you should report to your doctor or health care professional as soon as possible: -allergic reactions like skin rash, itching or hives, swelling of the face, lips, or tongue -low blood counts - This drug may decrease the number of white blood cells, red blood cells and platelets. You may be at increased risk for infections and bleeding. -signs of infection - fever or chills, cough, sore throat, pain or difficulty passing urine -signs of decreased platelets or bleeding - bruising, pinpoint red spots on the skin, black, tarry stools, nosebleeds -signs of decreased red blood cells - unusually weak or tired, fainting spells, lightheadedness -breathing problems -chest pain -high or low blood pressure -mouth sores -nausea and vomiting -pain, swelling, redness or irritation at the injection site -pain, tingling, numbness in the hands or feet -slow or irregular heartbeat -swelling of the ankle, feet, hands Side effects that usually do not require medical attention (report to your doctor or health care professional if they continue or are bothersome): -bone pain -complete hair loss including hair on your head, underarms, pubic hair, eyebrows, and eyelashes -changes in the color of fingernails -diarrhea -loosening of the fingernails -loss of appetite -muscle or joint pain -red flush to skin -sweating This list may not describe all possible side effects. Call your doctor for medical advice about side effects. You may report side effects to FDA at  1-800-FDA-1088. Where should I keep my medicine? This drug is given in a hospital or clinic and will not be stored at home. NOTE: This sheet is a summary. It may not cover all possible information. If you have questions about this medicine, talk to your doctor, pharmacist, or health care provider.  2014, Elsevier/Gold Standard. (2013-02-08 16:41:21) Carboplatin injection What is this medicine? CARBOPLATIN (KAR boe pla tin) is a chemotherapy drug. It targets fast dividing cells, like cancer cells, and causes these cells to die. This medicine is used to treat ovarian cancer and many other cancers. This medicine may be used for other purposes; ask your health care provider or pharmacist if you have questions. COMMON BRAND NAME(S): Paraplatin What should I tell my health care provider before I take this medicine? They need to know if you have any of these conditions: -blood disorders -hearing problems -kidney disease -recent or ongoing radiation therapy -an unusual or allergic reaction to carboplatin, cisplatin, other chemotherapy, other medicines, foods, dyes, or preservatives -pregnant or trying to get pregnant -breast-feeding How should I use this medicine? This drug is usually given as an infusion into a vein. It is administered in a hospital or clinic by a specially trained health care professional. Talk to your pediatrician regarding the use of this medicine in children. Special care may be needed. Overdosage: If you think you have taken too much of this medicine contact a poison control center or emergency room at once. NOTE: This medicine is only for you. Do not share this medicine with others. What if I miss a dose? It is important not to miss a dose. Call your   doctor or health care professional if you are unable to keep an appointment. What may interact with this medicine? -medicines for seizures -medicines to increase blood counts like filgrastim, pegfilgrastim,  sargramostim -some antibiotics like amikacin, gentamicin, neomycin, streptomycin, tobramycin -vaccines Talk to your doctor or health care professional before taking any of these medicines: -acetaminophen -aspirin -ibuprofen -ketoprofen -naproxen This list may not describe all possible interactions. Give your health care provider a list of all the medicines, herbs, non-prescription drugs, or dietary supplements you use. Also tell them if you smoke, drink alcohol, or use illegal drugs. Some items may interact with your medicine. What should I watch for while using this medicine? Your condition will be monitored carefully while you are receiving this medicine. You will need important blood work done while you are taking this medicine. This drug may make you feel generally unwell. This is not uncommon, as chemotherapy can affect healthy cells as well as cancer cells. Report any side effects. Continue your course of treatment even though you feel ill unless your doctor tells you to stop. In some cases, you may be given additional medicines to help with side effects. Follow all directions for their use. Call your doctor or health care professional for advice if you get a fever, chills or sore throat, or other symptoms of a cold or flu. Do not treat yourself. This drug decreases your body's ability to fight infections. Try to avoid being around people who are sick. This medicine may increase your risk to bruise or bleed. Call your doctor or health care professional if you notice any unusual bleeding. Be careful brushing and flossing your teeth or using a toothpick because you may get an infection or bleed more easily. If you have any dental work done, tell your dentist you are receiving this medicine. Avoid taking products that contain aspirin, acetaminophen, ibuprofen, naproxen, or ketoprofen unless instructed by your doctor. These medicines may hide a fever. Do not become pregnant while taking this  medicine. Women should inform their doctor if they wish to become pregnant or think they might be pregnant. There is a potential for serious side effects to an unborn child. Talk to your health care professional or pharmacist for more information. Do not breast-feed an infant while taking this medicine. What side effects may I notice from receiving this medicine? Side effects that you should report to your doctor or health care professional as soon as possible: -allergic reactions like skin rash, itching or hives, swelling of the face, lips, or tongue -signs of infection - fever or chills, cough, sore throat, pain or difficulty passing urine -signs of decreased platelets or bleeding - bruising, pinpoint red spots on the skin, black, tarry stools, nosebleeds -signs of decreased red blood cells - unusually weak or tired, fainting spells, lightheadedness -breathing problems -changes in hearing -changes in vision -chest pain -high blood pressure -low blood counts - This drug may decrease the number of white blood cells, red blood cells and platelets. You may be at increased risk for infections and bleeding. -nausea and vomiting -pain, swelling, redness or irritation at the injection site -pain, tingling, numbness in the hands or feet -problems with balance, talking, walking -trouble passing urine or change in the amount of urine Side effects that usually do not require medical attention (report to your doctor or health care professional if they continue or are bothersome): -hair loss -loss of appetite -metallic taste in the mouth or changes in taste This list may not describe all   possible side effects. Call your doctor for medical advice about side effects. You may report side effects to FDA at 1-800-FDA-1088. Where should I keep my medicine? This drug is given in a hospital or clinic and will not be stored at home. NOTE: This sheet is a summary. It may not cover all possible information. If you  have questions about this medicine, talk to your doctor, pharmacist, or health care provider.  2014, Elsevier/Gold Standard. (2008-03-22 14:38:05)  

## 2013-12-08 NOTE — Telephone Encounter (Signed)
appts made and printed...td 

## 2013-12-09 ENCOUNTER — Telehealth: Payer: Self-pay | Admitting: Adult Health

## 2013-12-09 ENCOUNTER — Ambulatory Visit (HOSPITAL_BASED_OUTPATIENT_CLINIC_OR_DEPARTMENT_OTHER): Payer: BC Managed Care – PPO

## 2013-12-09 VITALS — BP 133/69 | HR 95 | Temp 98.3°F

## 2013-12-09 DIAGNOSIS — Z5189 Encounter for other specified aftercare: Secondary | ICD-10-CM

## 2013-12-09 DIAGNOSIS — C50319 Malignant neoplasm of lower-inner quadrant of unspecified female breast: Secondary | ICD-10-CM

## 2013-12-09 DIAGNOSIS — C50311 Malignant neoplasm of lower-inner quadrant of right female breast: Secondary | ICD-10-CM

## 2013-12-09 MED ORDER — PEGFILGRASTIM INJECTION 6 MG/0.6ML
6.0000 mg | Freq: Once | SUBCUTANEOUS | Status: AC
  Administered 2013-12-09: 6 mg via SUBCUTANEOUS
  Filled 2013-12-09: qty 0.6

## 2013-12-09 NOTE — Telephone Encounter (Signed)
per Larna Daughters 12/10 POF f/up appt made w Dr. Claud Kelp for 01/04/14 SW pt and advised of same shh

## 2013-12-15 ENCOUNTER — Other Ambulatory Visit (HOSPITAL_BASED_OUTPATIENT_CLINIC_OR_DEPARTMENT_OTHER): Payer: BC Managed Care – PPO

## 2013-12-15 ENCOUNTER — Other Ambulatory Visit: Payer: BC Managed Care – PPO | Admitting: Lab

## 2013-12-15 ENCOUNTER — Ambulatory Visit (HOSPITAL_BASED_OUTPATIENT_CLINIC_OR_DEPARTMENT_OTHER): Payer: BC Managed Care – PPO | Admitting: Adult Health

## 2013-12-15 ENCOUNTER — Ambulatory Visit: Payer: BC Managed Care – PPO | Admitting: Family

## 2013-12-15 ENCOUNTER — Encounter: Payer: Self-pay | Admitting: Oncology

## 2013-12-15 ENCOUNTER — Encounter: Payer: Self-pay | Admitting: Adult Health

## 2013-12-15 VITALS — BP 115/70 | HR 106 | Temp 98.0°F | Resp 20 | Ht 66.0 in | Wt 192.7 lb

## 2013-12-15 DIAGNOSIS — C50319 Malignant neoplasm of lower-inner quadrant of unspecified female breast: Secondary | ICD-10-CM

## 2013-12-15 DIAGNOSIS — C50311 Malignant neoplasm of lower-inner quadrant of right female breast: Secondary | ICD-10-CM

## 2013-12-15 LAB — CBC WITH DIFFERENTIAL/PLATELET
Eosinophils Absolute: 0 10*3/uL (ref 0.0–0.5)
LYMPH%: 43.8 % (ref 14.0–49.7)
MCH: 30.2 pg (ref 25.1–34.0)
MCV: 89.2 fL (ref 79.5–101.0)
MONO%: 29.2 % — ABNORMAL HIGH (ref 0.0–14.0)
NEUT#: 0.1 10*3/uL — CL (ref 1.5–6.5)
NEUT%: 24.9 % — ABNORMAL LOW (ref 38.4–76.8)
Platelets: 146 10*3/uL (ref 145–400)
RBC: 3.25 10*6/uL — ABNORMAL LOW (ref 3.70–5.45)
RDW: 16 % — ABNORMAL HIGH (ref 11.2–14.5)
nRBC: 0 % (ref 0–0)

## 2013-12-15 LAB — COMPREHENSIVE METABOLIC PANEL (CC13)
ALT: 14 U/L (ref 0–55)
Anion Gap: 8 mEq/L (ref 3–11)
BUN: 12.6 mg/dL (ref 7.0–26.0)
CO2: 28 mEq/L (ref 22–29)
Calcium: 9.3 mg/dL (ref 8.4–10.4)
Creatinine: 0.7 mg/dL (ref 0.6–1.1)
Glucose: 171 mg/dl — ABNORMAL HIGH (ref 70–140)
Sodium: 140 mEq/L (ref 136–145)
Total Bilirubin: 0.53 mg/dL (ref 0.20–1.20)
Total Protein: 6.1 g/dL — ABNORMAL LOW (ref 6.4–8.3)

## 2013-12-15 NOTE — Progress Notes (Signed)
[facility]  Telephone:(336) [phone] Fax:(336) [phone]  OFFICE PROGRESS NOTE   ID: Natalie Arias   DOB: [birth year]  MR#: [id number]  WUX#:[id number]   PCP: Lovenia Kim, PA-C SU: Claud Kelp, MD RAD ONC:  Dorothy Puffer, MD   DIAGNOSES: Natalie Arias is a 52 y.o. 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   STAGE:  Breast cancer of lower-inner quadrant of right female breast   Primary site: Breast (Right)   Staging method: AJCC 7th Edition   Clinical: Stage IIA (T2, N0, cM0)   Summary: Stage IIA (T2, N0, cM0)   PRIOR THERAPY: #1 Right breast needle core biopsy performed on 09/30/2013 resulting in pathology that revealed an invasive mammary carcinoma likely ductal phenotype, intermediate grade, with a tumor that is estrogen receptor negative, progesterone receptor negative, HER-2/neu negative, with a Ki-67 80%.  #2 Bilateral breast MRI on 10/11/2013 showed within the right breast there was an irregularly marginated enhancing mass with central necrosis located in the upper-inner quadrant at the 2 o'clock position (the middle 1/3).  The mass measures 3.9 x 3.7 x 3.1 cm in size and was associated with clip artifact.  There were no additional areas of worrisome enhancement within the right breast.  The left breast did not have mass or abnormal enhancement.  No abnormal appearing lymph nodes (clinical stage IIA T2 N0).  #3 Patient was recommended neoadjuvant chemotherapy utilizing Adriamycin/Cytoxan x 4 cycles with neulasta support followed by Taxol/Carbo weekly x 12 weeks.       CURRENT THERAPY: Adriamycin/Cytoxan cycle 4 day 8  INTERVAL HISTORY: Natalie Arias was seen today for evaluation following her fourth cycle of Adriamycin/Cytoxan.  She is doing well today.  She denies fevers, chills, nausea, vomiting, constipation, diarrhea, numbness, or  any other concerns.  She started taking the Cipro because she has been out of town, a couple of days early.  She is tolerating that without any difficulty.    PAST MEDICAL HISTORY: Past Medical History  Diagnosis Date  . Asthma 20's    allergic asthema  . Seasonal allergies   . PONV (postoperative nausea and vomiting)   . Breast cancer dx'd 10/01/2013    right    PAST SURGICAL HISTORY: Past Surgical History  Procedure Laterality Date  . Laparoscopic endometriosis fulguration  1991  . Abdominal hysterectomy  1992  . Portacath placement N/A 10/18/2013    Procedure: INSERTION PORT-A-CATH WITH ULTRASOUND;  Surgeon: Ernestene Mention, MD;  Location: [facility];  Service: General;  Laterality: N/A;    FAMILY HISTORY: Family History  Problem Relation Age of Onset  . Lung cancer Maternal Uncle   . Breast cancer Paternal Aunt   . Breast cancer Maternal Grandmother   . Lung cancer Maternal Uncle   . Hypertension Father   . Heart disease Father   . Parkinsonism Father   . Cancer Father     Skin Cancer  . Hypercholesterolemia Mother   . Gallstones Mother   . Healthy Brother     SOCIAL HISTORY: History  Substance Use Topics  . Smoking status: Never Smoker   . Smokeless tobacco: Never Used  . Alcohol Use: Yes     Comment: once a month    ALLERGIES: Allergies  Allergen Reactions  . Penicillins Hives    CURRENT MEDICATIONS: Current Outpatient Prescriptions  Medication Sig Dispense Refill  . ciprofloxacin (CIPRO) 500 MG tablet Take  1 tablet (500 mg total) by mouth 2 (two) times daily.  14 tablet  3  . hydrocortisone (ANUSOL-HC) 2.5 % rectal cream Place 1 application rectally 2 (two) times daily.  30 g  0  . ibuprofen (ADVIL,MOTRIN) 200 MG tablet Take 200 mg by mouth every 6 (six) hours as needed for pain.      Marland Kitchen lidocaine-prilocaine (EMLA) cream Apply topically as needed. To port-a-cath 1.5 hours before chemotherapy and cover  30 g  1  . polyethylene glycol (MIRALAX / GLYCOLAX)  packet Take 17 g by mouth daily as needed.      . SENNA PO Take by mouth.      Marland Kitchen UNABLE TO FIND 1 each by Other route as needed. Cranial Prosthesis      . aspirin 81 MG tablet Take 81 mg by mouth every morning.       Marland Kitchen HYDROcodone-acetaminophen (NORCO/VICODIN) 5-325 MG per tablet Take 1-2 tablets by mouth every 4 (four) hours as needed for pain.  30 tablet  0   No current facility-administered medications for this visit.   REVIEW OF SYSTEMS:  A 10 point review of systems was conducted and is otherwise negative except for what is noted above.    PHYSICAL EXAMINATION: Blood pressure 115/70, pulse 106, temperature 98 F (36.7 C), temperature source Oral, resp. rate 20, height 5\' 6"  (1.676 m), weight 192 lb 11.2 oz (87.408 kg). General: Patient is a well appearing female in no acute distress HEENT: PERRLA, sclerae anicteric no conjunctival pallor, MMM Neck: supple, no palpable adenopathy Lungs: clear to auscultation bilaterally, no wheezes, rhonchi, or rales Cardiovascular: regular rate rhythm, S1, S2, no murmurs, rubs or gallops Abdomen: Soft, non-tender, non-distended, normoactive bowel sounds, no HSM Extremities: warm and well perfused, no clubbing, cyanosis, or edema Skin: No rashes or lesions Neuro: Non-focal Breasts: right breast with approximately 3cm palpable soft mass ECOG FS: 1 - Symptomatic but completely ambulatory  LABORATORIES:   Chemistry      Component Value Date/Time   NA 140 12/15/2013 1330   NA 140 10/14/2013 0930   K 4.2 12/15/2013 1330   K 4.7 10/14/2013 0930   CL 105 10/14/2013 0930   CO2 28 12/15/2013 1330   CO2 29 10/14/2013 0930   BUN 12.6 12/15/2013 1330   BUN 12 10/14/2013 0930   CREATININE 0.7 12/15/2013 1330   CREATININE 0.71 10/14/2013 0930      Component Value Date/Time   CALCIUM 9.3 12/15/2013 1330   CALCIUM 9.6 10/14/2013 0930   ALKPHOS 75 12/15/2013 1330   ALKPHOS 70 10/14/2013 0930   AST 9 12/15/2013 1330   AST 16 10/14/2013 0930   ALT  14 12/15/2013 1330   ALT 18 10/14/2013 0930   BILITOT 0.53 12/15/2013 1330   BILITOT 0.3 10/14/2013 0930      Lab Results  Component Value Date   WBC 0.5* 12/15/2013   HGB 9.8* 12/15/2013   HCT 29.0* 12/15/2013   MCV 89.2 12/15/2013   PLT 146 12/15/2013    STUDIES/IMAGING: 1.  2-D echocardiogram on 10/11/2013 showed a left ventricular ejection fraction of 60% - 65%.    ASSESSMENT: Leler Brion is a 52 y.o. female diagnosed with triple negative invasive ductal carcinoma of the right breast in 09/2013:  #1 Intermediate grade invasive ductal carcinoma of the right breast. The mass measures 3.5 cm by ultrasound. Prognostic markers revealed the tumor to be estrogen receptor negative, progesterone receptor negative, HER-2/neu negative, with an elevated proliferation marker Ki-67 of 80%.   #  2  Bilateral breast MRI on 10/11/2013 showed within the right breast there was an irregularly marginated enhancing mass with central necrosis located in the upper-inner quadrant at the 2 o'clock position (the middle 1/3).  The mass measures 3.9 x 3.7 x 3.1 cm in size and was associated with clip artifact.  There were no additional areas of worrisome enhancement within the right breast.  The left breast did not have mass or abnormal enhancement.  No abnormal appearing lymph nodes (clinical stage IIA T2 N0).    #3  Started neoadjuvant chemotherapy of dose dense Adriamycin/Cytoxan with 4 planned doses on 10/27/2013.  Day 2 granulocyte support with [name] injection.  This is scheduled to be followed by neoadjuvant chemotherapy consisting of Taxol/carbo weekly x 12 weeks.  #4  If patient does successfully undergo lumpectomy she will need radiation therapy.  #5 Patient has a family history of breast cancer and herself has triple negative breast cancer and is scheduled to be seen by the genetic counselor on 01/06/2014.  PLAN:   #1 Patient is doing well today.  She is neutropenic following her final cycle  of Adriamycin/Cytoxan.  She will continue taking cipro.  I printed out her neutropenic instructions for her in her AVS.  I reviewed them with her in detail.    #2 We discussed her next chemotherapy regimen of Taxol/Carbo.  She will return for this on 12/29/13.  I gave her details of the chemotherapies in her AVS as well.    #3 We will see her back on 12/29/13 for labs/eval and chemotherapy.    All questions answered.  Mrs. Riso was encouraged to contact us in the interim with any questions, concerns, or problems.  I spent 25 minutes counseling the patient face to face.  The total time spent in the appointment was 30 minutes.  Illa Level, NP Medical Oncology [facility] [phone] 12/16/2013  4:16 PM

## 2013-12-15 NOTE — Patient Instructions (Signed)
Patient Neutropenia Instruction Sheet  Diagnosis: Breast Cancer      Treating Physician: Drue Second, MD  Treatment: 1. Type of chemotherapy: Adriamycin/Cytoxan 2. Date of last treatment: 12/08/13  Last Blood Counts: Lab Results  Component Value Date   WBC 0.5* 12/15/2013   HGB 9.8* 12/15/2013   HCT 29.0* 12/15/2013   MCV 89.2 12/15/2013   PLT 146 12/15/2013   ANC 100     Prophylactic Antibiotics: Cipro 500 mg by mouth twice a day Instructions: 1. Monitor temperature and call if fever  greater than 100.5, chills, shaking chills (rigors) 2. Call Physician on-call at [phone] 3. Give him/her symptoms and list of medications that you are taking and your last blood count.  Paclitaxel injection What is this medicine? PACLITAXEL (PAK li TAX el) is a chemotherapy drug. It targets fast dividing cells, like cancer cells, and causes these cells to die. This medicine is used to treat ovarian cancer, breast cancer, and other cancers. This medicine may be used for other purposes; ask your health care provider or pharmacist if you have questions. COMMON BRAND NAME(S): Onxol , Taxol What should I tell my health care provider before I take this medicine? They need to know if you have any of these conditions: -blood disorders -irregular heartbeat -infection (especially a virus infection such as chickenpox, cold sores, or herpes) -liver disease -previous or ongoing radiation therapy -an unusual or allergic reaction to paclitaxel, alcohol, polyoxyethylated castor oil, other chemotherapy agents, other medicines, foods, dyes, or preservatives -pregnant or trying to get pregnant -breast-feeding How should I use this medicine? This drug is given as an infusion into a vein. It is administered in a hospital or clinic by a specially trained health care professional. Talk to your pediatrician regarding the use of this medicine in children. Special care may be needed. Overdosage: If you think  you have taken too much of this medicine contact a poison control center or emergency room at once. NOTE: This medicine is only for you. Do not share this medicine with others. What if I miss a dose? It is important not to miss your dose. Call your doctor or health care professional if you are unable to keep an appointment. What may interact with this medicine? Do not take this medicine with any of the following medications: -disulfiram -metronidazole This medicine may also interact with the following medications: -cyclosporine -diazepam -ketoconazole -medicines to increase blood counts like filgrastim, pegfilgrastim, sargramostim -other chemotherapy drugs like cisplatin, doxorubicin, epirubicin, etoposide, teniposide, vincristine -quinidine -testosterone -vaccines -verapamil Talk to your doctor or health care professional before taking any of these medicines: -acetaminophen -aspirin -ibuprofen -ketoprofen -naproxen This list may not describe all possible interactions. Give your health care provider a list of all the medicines, herbs, non-prescription drugs, or dietary supplements you use. Also tell them if you smoke, drink alcohol, or use illegal drugs. Some items may interact with your medicine. What should I watch for while using this medicine? Your condition will be monitored carefully while you are receiving this medicine. You will need important blood work done while you are taking this medicine. This drug may make you feel generally unwell. This is not uncommon, as chemotherapy can affect healthy cells as well as cancer cells. Report any side effects. Continue your course of treatment even though you feel ill unless your doctor tells you to stop. In some cases, you may be given additional medicines to help with side effects. Follow all directions for their use. Call your doctor or  health care professional for advice if you get a fever, chills or sore throat, or other symptoms of a  cold or flu. Do not treat yourself. This drug decreases your body's ability to fight infections. Try to avoid being around people who are sick. This medicine may increase your risk to bruise or bleed. Call your doctor or health care professional if you notice any unusual bleeding. Be careful brushing and flossing your teeth or using a toothpick because you may get an infection or bleed more easily. If you have any dental work done, tell your dentist you are receiving this medicine. Avoid taking products that contain aspirin, acetaminophen, ibuprofen, naproxen, or ketoprofen unless instructed by your doctor. These medicines may hide a fever. Do not become pregnant while taking this medicine. Women should inform their doctor if they wish to become pregnant or think they might be pregnant. There is a potential for serious side effects to an unborn child. Talk to your health care professional or pharmacist for more information. Do not breast-feed an infant while taking this medicine. Men are advised not to father a child while receiving this medicine. What side effects may I notice from receiving this medicine? Side effects that you should report to your doctor or health care professional as soon as possible: -allergic reactions like skin rash, itching or hives, swelling of the face, lips, or tongue -low blood counts - This drug may decrease the number of white blood cells, red blood cells and platelets. You may be at increased risk for infections and bleeding. -signs of infection - fever or chills, cough, sore throat, pain or difficulty passing urine -signs of decreased platelets or bleeding - bruising, pinpoint red spots on the skin, black, tarry stools, nosebleeds -signs of decreased red blood cells - unusually weak or tired, fainting spells, lightheadedness -breathing problems -chest pain -high or low blood pressure -mouth sores -nausea and vomiting -pain, swelling, redness or irritation at the  injection site -pain, tingling, numbness in the hands or feet -slow or irregular heartbeat -swelling of the ankle, feet, hands Side effects that usually do not require medical attention (report to your doctor or health care professional if they continue or are bothersome): -bone pain -complete hair loss including hair on your head, underarms, pubic hair, eyebrows, and eyelashes -changes in the color of fingernails -diarrhea -loosening of the fingernails -loss of appetite -muscle or joint pain -red flush to skin -sweating This list may not describe all possible side effects. Call your doctor for medical advice about side effects. You may report side effects to FDA at 1-800-FDA-1088. Where should I keep my medicine? This drug is given in a hospital or clinic and will not be stored at home. NOTE: This sheet is a summary. It may not cover all possible information. If you have questions about this medicine, talk to your doctor, pharmacist, or health care provider.  2014, Elsevier/Gold Standard. (2013-02-08 16:41:21) Carboplatin injection What is this medicine? CARBOPLATIN (KAR boe pla tin) is a chemotherapy drug. It targets fast dividing cells, like cancer cells, and causes these cells to die. This medicine is used to treat ovarian cancer and many other cancers. This medicine may be used for other purposes; ask your health care provider or pharmacist if you have questions. COMMON BRAND NAME(S): Paraplatin What should I tell my health care provider before I take this medicine? They need to know if you have any of these conditions: -blood disorders -hearing problems -kidney disease -recent or ongoing radiation therapy -  an unusual or allergic reaction to carboplatin, cisplatin, other chemotherapy, other medicines, foods, dyes, or preservatives -pregnant or trying to get pregnant -breast-feeding How should I use this medicine? This drug is usually given as an infusion into a vein. It is  administered in a hospital or clinic by a specially trained health care professional. Talk to your pediatrician regarding the use of this medicine in children. Special care may be needed. Overdosage: If you think you have taken too much of this medicine contact a poison control center or emergency room at once. NOTE: This medicine is only for you. Do not share this medicine with others. What if I miss a dose? It is important not to miss a dose. Call your doctor or health care professional if you are unable to keep an appointment. What may interact with this medicine? -medicines for seizures -medicines to increase blood counts like filgrastim, pegfilgrastim, sargramostim -some antibiotics like amikacin, gentamicin, neomycin, streptomycin, tobramycin -vaccines Talk to your doctor or health care professional before taking any of these medicines: -acetaminophen -aspirin -ibuprofen -ketoprofen -naproxen This list may not describe all possible interactions. Give your health care provider a list of all the medicines, herbs, non-prescription drugs, or dietary supplements you use. Also tell them if you smoke, drink alcohol, or use illegal drugs. Some items may interact with your medicine. What should I watch for while using this medicine? Your condition will be monitored carefully while you are receiving this medicine. You will need important blood work done while you are taking this medicine. This drug may make you feel generally unwell. This is not uncommon, as chemotherapy can affect healthy cells as well as cancer cells. Report any side effects. Continue your course of treatment even though you feel ill unless your doctor tells you to stop. In some cases, you may be given additional medicines to help with side effects. Follow all directions for their use. Call your doctor or health care professional for advice if you get a fever, chills or sore throat, or other symptoms of a cold or flu. Do not  treat yourself. This drug decreases your body's ability to fight infections. Try to avoid being around people who are sick. This medicine may increase your risk to bruise or bleed. Call your doctor or health care professional if you notice any unusual bleeding. Be careful brushing and flossing your teeth or using a toothpick because you may get an infection or bleed more easily. If you have any dental work done, tell your dentist you are receiving this medicine. Avoid taking products that contain aspirin, acetaminophen, ibuprofen, naproxen, or ketoprofen unless instructed by your doctor. These medicines may hide a fever. Do not become pregnant while taking this medicine. Women should inform their doctor if they wish to become pregnant or think they might be pregnant. There is a potential for serious side effects to an unborn child. Talk to your health care professional or pharmacist for more information. Do not breast-feed an infant while taking this medicine. What side effects may I notice from receiving this medicine? Side effects that you should report to your doctor or health care professional as soon as possible: -allergic reactions like skin rash, itching or hives, swelling of the face, lips, or tongue -signs of infection - fever or chills, cough, sore throat, pain or difficulty passing urine -signs of decreased platelets or bleeding - bruising, pinpoint red spots on the skin, black, tarry stools, nosebleeds -signs of decreased red blood cells - unusually weak or  tired, fainting spells, lightheadedness -breathing problems -changes in hearing -changes in vision -chest pain -high blood pressure -low blood counts - This drug may decrease the number of white blood cells, red blood cells and platelets. You may be at increased risk for infections and bleeding. -nausea and vomiting -pain, swelling, redness or irritation at the injection site -pain, tingling, numbness in the hands or feet -problems  with balance, talking, walking -trouble passing urine or change in the amount of urine Side effects that usually do not require medical attention (report to your doctor or health care professional if they continue or are bothersome): -hair loss -loss of appetite -metallic taste in the mouth or changes in taste This list may not describe all possible side effects. Call your doctor for medical advice about side effects. You may report side effects to FDA at 1-800-FDA-1088. Where should I keep my medicine? This drug is given in a hospital or clinic and will not be stored at home. NOTE: This sheet is a summary. It may not cover all possible information. If you have questions about this medicine, talk to your doctor, pharmacist, or health care provider.  2014, Elsevier/Gold Standard. (2008-03-22 14:38:05)

## 2013-12-29 ENCOUNTER — Other Ambulatory Visit: Payer: BC Managed Care – PPO

## 2013-12-29 ENCOUNTER — Other Ambulatory Visit (HOSPITAL_BASED_OUTPATIENT_CLINIC_OR_DEPARTMENT_OTHER): Payer: BC Managed Care – PPO

## 2013-12-29 ENCOUNTER — Ambulatory Visit: Payer: BC Managed Care – PPO | Admitting: Adult Health

## 2013-12-29 ENCOUNTER — Ambulatory Visit (HOSPITAL_BASED_OUTPATIENT_CLINIC_OR_DEPARTMENT_OTHER): Payer: BC Managed Care – PPO

## 2013-12-29 ENCOUNTER — Ambulatory Visit (HOSPITAL_BASED_OUTPATIENT_CLINIC_OR_DEPARTMENT_OTHER): Payer: BC Managed Care – PPO | Admitting: Adult Health

## 2013-12-29 ENCOUNTER — Encounter: Payer: Self-pay | Admitting: Adult Health

## 2013-12-29 VITALS — BP 103/67 | HR 115 | Temp 98.3°F | Resp 18 | Ht 66.0 in | Wt 195.6 lb

## 2013-12-29 VITALS — BP 109/65 | HR 90 | Temp 98.5°F | Resp 18

## 2013-12-29 DIAGNOSIS — C50311 Malignant neoplasm of lower-inner quadrant of right female breast: Secondary | ICD-10-CM

## 2013-12-29 DIAGNOSIS — C50219 Malignant neoplasm of upper-inner quadrant of unspecified female breast: Secondary | ICD-10-CM

## 2013-12-29 DIAGNOSIS — Z5111 Encounter for antineoplastic chemotherapy: Secondary | ICD-10-CM

## 2013-12-29 DIAGNOSIS — Z803 Family history of malignant neoplasm of breast: Secondary | ICD-10-CM

## 2013-12-29 DIAGNOSIS — Z171 Estrogen receptor negative status [ER-]: Secondary | ICD-10-CM

## 2013-12-29 LAB — COMPREHENSIVE METABOLIC PANEL (CC13)
Albumin: 3.7 g/dL (ref 3.5–5.0)
Anion Gap: 12 mEq/L — ABNORMAL HIGH (ref 3–11)
BUN: 8 mg/dL (ref 7.0–26.0)
CO2: 24 mEq/L (ref 22–29)
Calcium: 9.3 mg/dL (ref 8.4–10.4)
Chloride: 107 mEq/L (ref 98–109)
Creatinine: 0.7 mg/dL (ref 0.6–1.1)
Glucose: 140 mg/dl (ref 70–140)
Potassium: 3.8 mEq/L (ref 3.5–5.1)

## 2013-12-29 LAB — CBC WITH DIFFERENTIAL/PLATELET
Basophils Absolute: 0.1 10*3/uL (ref 0.0–0.1)
Eosinophils Absolute: 0 10*3/uL (ref 0.0–0.5)
HCT: 31.5 % — ABNORMAL LOW (ref 34.8–46.6)
HGB: 10.9 g/dL — ABNORMAL LOW (ref 11.6–15.9)
MCHC: 34.7 g/dL (ref 31.5–36.0)
MONO#: 0.7 10*3/uL (ref 0.1–0.9)
NEUT#: 4.7 10*3/uL (ref 1.5–6.5)
NEUT%: 79.4 % — ABNORMAL HIGH (ref 38.4–76.8)
RDW: 19.2 % — ABNORMAL HIGH (ref 11.2–14.5)
WBC: 5.9 10*3/uL (ref 3.9–10.3)
lymph#: 0.5 10*3/uL — ABNORMAL LOW (ref 0.9–3.3)

## 2013-12-29 MED ORDER — PACLITAXEL CHEMO INJECTION 300 MG/50ML: 80.0000 mg/m2 | Freq: Once | INTRAVENOUS | Status: DC

## 2013-12-29 MED ORDER — HEPARIN SOD (PORK) LOCK FLUSH 100 UNIT/ML IV SOLN
500.0000 [IU] | Freq: Once | INTRAVENOUS | Status: AC | PRN
  Administered 2013-12-29: 500 [IU]
  Filled 2013-12-29: qty 5

## 2013-12-29 MED ORDER — PROCHLORPERAZINE MALEATE 10 MG PO TABS: 10.0000 mg | ORAL_TABLET | Freq: Four times a day (QID) | ORAL | Status: DC | PRN

## 2013-12-29 MED ORDER — DEXAMETHASONE 4 MG PO TABS: 8.0000 mg | ORAL_TABLET | Freq: Two times a day (BID) | ORAL | Status: DC

## 2013-12-29 MED ORDER — DEXAMETHASONE SODIUM PHOSPHATE 20 MG/5ML IJ SOLN
INTRAMUSCULAR | Status: AC
  Filled 2013-12-29: qty 5

## 2013-12-29 MED ORDER — FAMOTIDINE IN NACL 20-0.9 MG/50ML-% IV SOLN
20.0000 mg | Freq: Once | INTRAVENOUS | Status: AC
  Administered 2013-12-29: 20 mg via INTRAVENOUS

## 2013-12-29 MED ORDER — SODIUM CHLORIDE 0.9 % IV SOLN
300.0000 mg | Freq: Once | INTRAVENOUS | Status: AC
  Administered 2013-12-29: 300 mg via INTRAVENOUS
  Filled 2013-12-29: qty 30

## 2013-12-29 MED ORDER — DIPHENHYDRAMINE HCL 50 MG/ML IJ SOLN
INTRAMUSCULAR | Status: AC
  Filled 2013-12-29: qty 1

## 2013-12-29 MED ORDER — SODIUM CHLORIDE 0.9 % IV SOLN
80.0000 mg/m2 | Freq: Once | INTRAVENOUS | Status: AC
  Administered 2013-12-29: 162 mg via INTRAVENOUS
  Filled 2013-12-29: qty 27

## 2013-12-29 MED ORDER — DIPHENHYDRAMINE HCL 50 MG/ML IJ SOLN
50.0000 mg | Freq: Once | INTRAMUSCULAR | Status: AC
  Administered 2013-12-29: 50 mg via INTRAVENOUS

## 2013-12-29 MED ORDER — ONDANSETRON 16 MG/50ML IVPB (CHCC)
16.0000 mg | Freq: Once | INTRAVENOUS | Status: AC
  Administered 2013-12-29: 16 mg via INTRAVENOUS

## 2013-12-29 MED ORDER — SODIUM CHLORIDE 0.9 % IJ SOLN
10.0000 mL | INTRAMUSCULAR | Status: DC | PRN
  Administered 2013-12-29: 10 mL
  Filled 2013-12-29: qty 10

## 2013-12-29 MED ORDER — LORAZEPAM 0.5 MG PO TABS: 0.5000 mg | ORAL_TABLET | Freq: Four times a day (QID) | ORAL | Status: DC | PRN

## 2013-12-29 MED ORDER — FAMOTIDINE IN NACL 20-0.9 MG/50ML-% IV SOLN
INTRAVENOUS | Status: AC
  Filled 2013-12-29: qty 50

## 2013-12-29 MED ORDER — ONDANSETRON 16 MG/50ML IVPB (CHCC)
INTRAVENOUS | Status: AC
  Filled 2013-12-29: qty 16

## 2013-12-29 MED ORDER — ONDANSETRON HCL 8 MG PO TABS: 8.0000 mg | ORAL_TABLET | Freq: Two times a day (BID) | ORAL | Status: DC

## 2013-12-29 MED ORDER — DEXAMETHASONE SODIUM PHOSPHATE 20 MG/5ML IJ SOLN
20.0000 mg | Freq: Once | INTRAMUSCULAR | Status: AC
  Administered 2013-12-29: 20 mg via INTRAVENOUS

## 2013-12-29 MED ORDER — SODIUM CHLORIDE 0.9 % IV SOLN
Freq: Once | INTRAVENOUS | Status: AC
  Administered 2013-12-29: 14:00:00 via INTRAVENOUS

## 2013-12-29 NOTE — Patient Instructions (Signed)
Take Zofran/Ondansetron 8mg  by mouth twice a day for three days after treatment  Take Dexamethasone/Decadron 8mg  by mouth twice a day for three days after treatment  Take Compazine 10mg  every 6 hours as needed for nausea.

## 2013-12-29 NOTE — Progress Notes (Signed)
[facility]  Telephone:(336) [phone] Fax:([phone]) [phone]  OFFICE PROGRESS NOTE   ID: Natalie Arias   DOB: [birth year]  MR#: [id number]  AOZ#:[id number]   PCP: Lovenia Kim, PA-C SU: Claud Kelp, MD RAD ONC:  Dorothy Puffer, MD   DIAGNOSES: Natalie Arias is a 52 y.o. 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   STAGE:  Breast cancer of lower-inner quadrant of right female breast   Primary site: Breast (Right)   Staging method: AJCC 7th Edition   Clinical: Stage IIA (T2, N0, cM0)   Summary: Stage IIA (T2, N0, cM0)   PRIOR THERAPY: #1 Right breast needle core biopsy performed on 09/30/2013 resulting in pathology that revealed an invasive mammary carcinoma likely ductal phenotype, intermediate grade, with a tumor that is estrogen receptor negative, progesterone receptor negative, HER-2/neu negative, with a Ki-67 80%.  #2 Bilateral breast MRI on 10/11/2013 showed within the right breast there was an irregularly marginated enhancing mass with central necrosis located in the upper-inner quadrant at the 2 o'clock position (the middle 1/3).  The mass measures 3.9 x 3.7 x 3.1 cm in size and was associated with clip artifact.  There were no additional areas of worrisome enhancement within the right breast.  The left breast did not have mass or abnormal enhancement.  No abnormal appearing lymph nodes (clinical stage IIA T2 N0).  #3 Patient was recommended neoadjuvant chemotherapy utilizing Adriamycin/Cytoxan x 4 cycles with neulasta support followed by Taxol/Carbo weekly x 12 weeks.       CURRENT THERAPY: Taxol/Carbo week one  INTERVAL HISTORY: Natalie Arias was seen today for evaluation prior to her first cycle of carbo taxol.  She is doing well today.  She would like her Compazine refilled.  She and I reviewed the Taxol Carbo and adverse effects at her  last appointment, she was given detailed reading information about the chemotherapies and denies any questions about it today.  She denies fevers, chills, nasuea, vomiting, constipation, diarrhea, numbness, or any further concerns.    PAST MEDICAL HISTORY: Past Medical History  Diagnosis Date  . Asthma 20's    allergic asthema  . Seasonal allergies   . PONV (postoperative nausea and vomiting)   . Breast cancer dx'd 10/01/2013    right    PAST SURGICAL HISTORY: Past Surgical History  Procedure Laterality Date  . Laparoscopic endometriosis fulguration  1991  . Abdominal hysterectomy  1992  . Portacath placement N/A 10/18/2013    Procedure: INSERTION PORT-A-CATH WITH ULTRASOUND;  Surgeon: Ernestene Mention, MD;  Location: [facility];  Service: General;  Laterality: N/A;    FAMILY HISTORY: Family History  Problem Relation Age of Onset  . Lung cancer Maternal Uncle   . Breast cancer Paternal Aunt   . Breast cancer Maternal Grandmother   . Lung cancer Maternal Uncle   . Hypertension Father   . Heart disease Father   . Parkinsonism Father   . Cancer Father     Skin Cancer  . Hypercholesterolemia Mother   . Gallstones Mother   . Healthy Brother     SOCIAL HISTORY: History  Substance Use Topics  . Smoking status: Never Smoker   . Smokeless tobacco: Never Used  . Alcohol Use: Yes     Comment: once a month    ALLERGIES: Allergies  Allergen Reactions  . Penicillins Hives    CURRENT MEDICATIONS: Current Outpatient Prescriptions  Medication Sig Dispense Refill  . hydrocortisone (ANUSOL-HC) 2.5 % rectal cream Place 1 application rectally 2 (two) times daily.  30 g  0  . ibuprofen (ADVIL,MOTRIN) 200 MG tablet Take 200 mg by mouth every 6 (six) hours as needed for pain.      Marland Kitchen lidocaine-prilocaine (EMLA) cream Apply topically as needed. To port-a-cath 1.5 hours before chemotherapy and cover  30 g  1  . SENNA PO Take by mouth.      . dexamethasone (DECADRON) 4 MG tablet Take 2  tablets (8 mg total) by mouth 2 (two) times daily with a meal. Take two times a day starting the day after chemotherapy for 3 days.  30 tablet  1  . LORazepam (ATIVAN) 0.5 MG tablet Take 1 tablet (0.5 mg total) by mouth every 6 (six) hours as needed (Nausea or vomiting).  30 tablet  0  . ondansetron (ZOFRAN) 8 MG tablet Take 1 tablet (8 mg total) by mouth 2 (two) times daily. Take two times a day starting the day after chemo for 3 days. Then take two times a day as needed for nausea or vomiting.  30 tablet  1  . polyethylene glycol (MIRALAX / GLYCOLAX) packet Take 17 g by mouth daily as needed.      . prochlorperazine (COMPAZINE) 10 MG tablet Take 1 tablet (10 mg total) by mouth every 6 (six) hours as needed (Nausea or vomiting).  30 tablet  1  . UNABLE TO FIND 1 each by Other route as needed. Cranial Prosthesis       No current facility-administered medications for this visit.   REVIEW OF SYSTEMS:  A 10 point review of systems was conducted and is otherwise negative except for what is noted above.    PHYSICAL EXAMINATION: Blood pressure 103/67, pulse 115, temperature 98.3 F (36.8 C), temperature source Oral, resp. rate 18, height 5\' 6"  (1.676 m), weight 195 lb 9 oz (88.707 kg). General: Patient is a well appearing female in no acute distress HEENT: PERRLA, sclerae anicteric no conjunctival pallor, MMM Neck: supple, no palpable adenopathy Lungs: clear to auscultation bilaterally, no wheezes, rhonchi, or rales Cardiovascular: regular rate rhythm, S1, S2, no murmurs, rubs or gallops Abdomen: Soft, non-tender, non-distended, normoactive bowel sounds, no HSM Extremities: warm and well perfused, no clubbing, cyanosis, or edema Skin: No rashes or lesions Neuro: Non-focal Breasts: right breast with approximately 2cm palpable soft mass ECOG FS: 1 - Symptomatic but completely ambulatory  LABORATORIES:   Chemistry      Component Value Date/Time   NA 143 12/29/2013 1147   NA 140 10/14/2013 0930    K 3.8 12/29/2013 1147   K 4.7 10/14/2013 0930   CL 105 10/14/2013 0930   CO2 24 12/29/2013 1147   CO2 29 10/14/2013 0930   BUN 8.0 12/29/2013 1147   BUN 12 10/14/2013 0930   CREATININE 0.7 12/29/2013 1147   CREATININE 0.71 10/14/2013 0930      Component Value Date/Time   CALCIUM 9.3 12/29/2013 1147   CALCIUM 9.6 10/14/2013 0930   ALKPHOS 81 12/29/2013 1147   ALKPHOS 70 10/14/2013 0930   AST 16 12/29/2013 1147   AST 16 10/14/2013 0930   ALT 19 12/29/2013 1147   ALT 18 10/14/2013 0930   BILITOT 0.45 12/29/2013 1147   BILITOT 0.3 10/14/2013 0930      Lab Results  Component Value Date   WBC 5.9 12/29/2013   HGB 10.9* 12/29/2013   HCT 31.5* 12/29/2013   MCV 92.2  12/29/2013   PLT 264 12/29/2013    STUDIES/IMAGING: 1.  2-D echocardiogram on 10/11/2013 showed a left ventricular ejection fraction of 60% - 65%.    ASSESSMENT: Harlean Regula is a 52 y.o. female diagnosed with triple negative invasive ductal carcinoma of the right breast in 09/2013:  #1 Intermediate grade invasive ductal carcinoma of the right breast. The mass measures 3.5 cm by ultrasound. Prognostic markers revealed the tumor to be estrogen receptor negative, progesterone receptor negative, HER-2/neu negative, with an elevated proliferation marker Ki-67 of 80%.   #2  Bilateral breast MRI on 10/11/2013 showed within the right breast there was an irregularly marginated enhancing mass with central necrosis located in the upper-inner quadrant at the 2 o'clock position (the middle 1/3).  The mass measures 3.9 x 3.7 x 3.1 cm in size and was associated with clip artifact.  There were no additional areas of worrisome enhancement within the right breast.  The left breast did not have mass or abnormal enhancement.  No abnormal appearing lymph nodes (clinical stage IIA T2 N0).    #3  Started neoadjuvant chemotherapy of dose dense Adriamycin/Cytoxan with 4 planned doses on 10/27/2013.  Day 2 granulocyte support with [name]  injection.  This is scheduled to be followed by neoadjuvant chemotherapy consisting of Taxol/carbo weekly x 12 weeks.  #4  If patient does successfully undergo lumpectomy she will need radiation therapy.  #5 Patient has a family history of breast cancer and herself has triple negative breast cancer and is scheduled to be seen by the genetic counselor on 01/06/2014.  PLAN:   #1 Patient is doing well.  Her labs are stable, she will proceed with Taxol/Carbo today.    #2 I refilled her Compazine and reviewed her antiemetic regimen with her.  I also gave her written prescriptions for Lorazepam and Ondansetron.    #3  She will return in one week for labs, evaluation, and chemo.   All questions answered.  Mrs. Bivins was encouraged to contact us in the interim with any questions, concerns, or problems.  I spent 25 minutes counseling the patient face to face.  The total time spent in the appointment was 30 minutes.  Illa Level, NP Medical Oncology [facility] [phone] 12/30/2013  1:08 PM

## 2013-12-29 NOTE — Patient Instructions (Signed)
[facility] Discharge Instructions for Patients Receiving Chemotherapy  Today you received the following chemotherapy agents Taxol/Carboplatin To help prevent nausea and vomiting after your treatment, we encourage you to take your nausea medication as prescribed.  If you develop nausea and vomiting that is not controlled by your nausea medication, call the clinic.   BELOW ARE SYMPTOMS THAT SHOULD BE REPORTED IMMEDIATELY:  *FEVER GREATER THAN 100.5 F  *CHILLS WITH OR WITHOUT FEVER  NAUSEA AND VOMITING THAT IS NOT CONTROLLED WITH YOUR NAUSEA MEDICATION  *UNUSUAL SHORTNESS OF BREATH  *UNUSUAL BRUISING OR BLEEDING  TENDERNESS IN MOUTH AND THROAT WITH OR WITHOUT PRESENCE OF ULCERS  *URINARY PROBLEMS  *BOWEL PROBLEMS  UNUSUAL RASH Items with * indicate a potential emergency and should be followed up as soon as possible.  Feel free to call the clinic you have any questions or concerns. The clinic phone number is [phone].    

## 2013-12-31 ENCOUNTER — Telehealth: Payer: Self-pay | Admitting: *Deleted

## 2013-12-31 NOTE — Telephone Encounter (Signed)
Called patient post 1st Taxol/Carbo. Pt denies any N/V or diarrhea. States she has done well, has noticed skin is getting dry. Pt verbalized what to do for dry skin and has cream for nails. To call if has any other problems or questions

## 2014-01-04 ENCOUNTER — Ambulatory Visit (INDEPENDENT_AMBULATORY_CARE_PROVIDER_SITE_OTHER): Payer: BC Managed Care – PPO | Admitting: General Surgery

## 2014-01-04 ENCOUNTER — Encounter (INDEPENDENT_AMBULATORY_CARE_PROVIDER_SITE_OTHER): Payer: Self-pay | Admitting: General Surgery

## 2014-01-04 VITALS — BP 110/70 | HR 90 | Temp 98.3°F | Resp 18 | Ht 66.0 in | Wt 198.0 lb

## 2014-01-04 DIAGNOSIS — C50311 Malignant neoplasm of lower-inner quadrant of right female breast: Secondary | ICD-10-CM

## 2014-01-04 DIAGNOSIS — C50319 Malignant neoplasm of lower-inner quadrant of unspecified female breast: Secondary | ICD-10-CM

## 2014-01-04 NOTE — Patient Instructions (Signed)
The tumor in your right breast is responding nicely to the chemotherapy. It is much less distinct and I would guess that it may be about half the size it was when we first made the diagnosis.  Continue your adjuvant chemotherapy treatments.  Dr. Humphrey Rolls will schedule you for an end of treatment MRI in early April after you finish all of your chemotherapy.  Return to see Dr. Dalbert Batman in April after you get the MRI.

## 2014-01-04 NOTE — Progress Notes (Signed)
Patient ID: Natalie Arias, female   DOB: Oct 04, 1961, 53 y.o.   MRN: [id number] History: This patient returns for evaluation midway through her neoadjuvant chemotherapy for treatment of the invasive cancer in the upper inner quadrant of the right breast. Initially, she was referred by Dr. Elige Radon at [facility] health care for evaluation and management of a locally advanced invasive cancer of the right breast at the 3:00 position. Dr. Briscoe Deutscher is her primary care physician in [facility] . She was initially evaluated in the [facility]  by Dr. Humphrey Rolls, Dr. Lisbeth Renshaw, and me.  She noticed a lump in her right breast in August.. She states her last mammogram was in December 2013 and nothing was seen. Mammograms showed a 3.5 cm suspicious mass in the right breast medially. Image guided biopsy shows invasive mammary carcinoma, probably invasive ductal carcinoma. This is  a TNBC.Marland Kitchen  MRI shows that this is a solitary mass. Nodes looked negative by MRI. She is interested in breast conservation. I placed a Port-A-Cath on 10/18/2013. She has completed 4 cycles of Adriamycin /Cytoxan. She has just started weekly Taxol/.Carbo and that is planned for 12 weeks. So she will complete all her chemotherapy the first week of April.   Family history reveals breast cancer in a maternal grandmother and a paternal aunt but no ovarian cancer.  She is doing fairly well, considering. She says she thinks that the mass in her right breast is responding and is much less palpable now.  Past history, family history, social history, and review of systems are documented on the chart, unchanged, and noncontributory except as described above  Exam: Patient looks well. Patient noted. Husband with her Neck reveals no adenopathy or mass Lungs are clear to auscultation bilaterally Heart reveals regular rate and rhythm, no murmur, no ectopy Right breast reveals that the palpable mass in the right breast in the upper-inner quadrant is still palpable  but much less distinct. Previously it had been fairly obvious 3.5-4 cm in diameter. Now it is less distinct, maybe 2 cm in size. Skin is healthy. No axillary adenopathy.  Assessment: Invasive mammary carcinoma right breast, upper inner quadrant, triple negative breast cancer. Initial stage TII, N0 Clinically, she is responding to neoadjuvant chemotherapy The patient continues to desire breast conservation surgery, and hopefully she is going to be a good candidate for that, considering the clinical  response to date  Plan: Continue 12 week course of weekly Taxol /carbo End of treatment MRI in early April Return to see me after MRI in mid April for discussion and definitive surgical planning.   Edsel Petrin. Dalbert Batman, M.D., [facility] General and Minimally invasive Surgery Breast and Colorectal Surgery Office:   [phone] Pager:   [phone]

## 2014-01-06 ENCOUNTER — Other Ambulatory Visit (HOSPITAL_BASED_OUTPATIENT_CLINIC_OR_DEPARTMENT_OTHER): Payer: BC Managed Care – PPO

## 2014-01-06 ENCOUNTER — Ambulatory Visit (HOSPITAL_BASED_OUTPATIENT_CLINIC_OR_DEPARTMENT_OTHER): Payer: BC Managed Care – PPO | Admitting: Genetic Counselor

## 2014-01-06 ENCOUNTER — Encounter: Payer: Self-pay | Admitting: Genetic Counselor

## 2014-01-06 DIAGNOSIS — C50319 Malignant neoplasm of lower-inner quadrant of unspecified female breast: Secondary | ICD-10-CM

## 2014-01-06 DIAGNOSIS — IMO0002 Reserved for concepts with insufficient information to code with codable children: Secondary | ICD-10-CM

## 2014-01-06 DIAGNOSIS — C50311 Malignant neoplasm of lower-inner quadrant of right female breast: Secondary | ICD-10-CM

## 2014-01-06 DIAGNOSIS — Z803 Family history of malignant neoplasm of breast: Secondary | ICD-10-CM

## 2014-01-06 DIAGNOSIS — Z801 Family history of malignant neoplasm of trachea, bronchus and lung: Secondary | ICD-10-CM

## 2014-01-06 LAB — CBC WITH DIFFERENTIAL/PLATELET
BASO%: 0.4 % (ref 0.0–2.0)
Basophils Absolute: 0 10*3/uL (ref 0.0–0.1)
EOS%: 2.7 % (ref 0.0–7.0)
Eosinophils Absolute: 0.2 10*3/uL (ref 0.0–0.5)
HCT: 31.6 % — ABNORMAL LOW (ref 34.8–46.6)
HGB: 10.5 g/dL — ABNORMAL LOW (ref 11.6–15.9)
LYMPH%: 14.1 % (ref 14.0–49.7)
MCH: 30.9 pg (ref 25.1–34.0)
MCHC: 33.2 g/dL (ref 31.5–36.0)
MCV: 92.9 fL (ref 79.5–101.0)
MONO#: 0.6 10*3/uL (ref 0.1–0.9)
MONO%: 11.7 % (ref 0.0–14.0)
NEUT#: 3.9 10*3/uL (ref 1.5–6.5)
NEUT%: 71.1 % (ref 38.4–76.8)
Platelets: 239 10*3/uL (ref 145–400)
RBC: 3.4 10*6/uL — ABNORMAL LOW (ref 3.70–5.45)
RDW: 17.8 % — ABNORMAL HIGH (ref 11.2–14.5)
WBC: 5.5 10*3/uL (ref 3.9–10.3)
lymph#: 0.8 10*3/uL — ABNORMAL LOW (ref 0.9–3.3)
nRBC: 0 % (ref 0–0)

## 2014-01-06 LAB — COMPREHENSIVE METABOLIC PANEL (CC13)
ALT: 19 U/L (ref 0–55)
AST: 17 U/L (ref 5–34)
Albumin: 3.7 g/dL (ref 3.5–5.0)
Alkaline Phosphatase: 61 U/L (ref 40–150)
Anion Gap: 8 meq/L (ref 3–11)
BUN: 12.8 mg/dL (ref 7.0–26.0)
CO2: 26 meq/L (ref 22–29)
Calcium: 9.2 mg/dL (ref 8.4–10.4)
Chloride: 107 meq/L (ref 98–109)
Creatinine: 0.7 mg/dL (ref 0.6–1.1)
Glucose: 99 mg/dL (ref 70–140)
Potassium: 4.4 meq/L (ref 3.5–5.1)
Sodium: 141 meq/L (ref 136–145)
Total Bilirubin: 0.37 mg/dL (ref 0.20–1.20)
Total Protein: 6.2 g/dL — ABNORMAL LOW (ref 6.4–8.3)

## 2014-01-06 NOTE — Progress Notes (Signed)
Dr.  Marcy Panning requested a consultation for genetic counseling and risk assessment for Natalie Arias, a 53 y.o. female, for discussion of her personal history of breast cancer and family history of breast cancer.  She presents to clinic today to discuss the possibility of a genetic predisposition to cancer, and to further clarify her risks, as well as her family members' risks for cancer.   HISTORY OF PRESENT ILLNESS: In 2014, at the age of 63, Natalie Arias was diagnosed with invasive ductal carcioma of the right breast. This was treated with chemotherapy, and she is scheduled for lumpectomy (depending on genetic testing outcome) and radiation.  The tumor is triple negative.     Past Medical History  Diagnosis Date  . Asthma 20's    allergic asthema  . Seasonal allergies   . PONV (postoperative nausea and vomiting)   . Breast cancer dx'd 10/01/2013    right; triplen negative    Past Surgical History  Procedure Laterality Date  . Laparoscopic endometriosis fulguration  1991  . Abdominal hysterectomy  1992  . Portacath placement N/A 10/18/2013    Procedure: INSERTION PORT-A-CATH WITH ULTRASOUND;  Surgeon: Adin Hector, MD;  Location: [facility];  Service: General;  Laterality: N/A;    History   Social History  . Marital Status: Married    Spouse Name: N/A    Number of Children: 2  . Years of Education: N/A   Occupational History  .  [employer]   Social History Main Topics  . Smoking status: Never Smoker   . Smokeless tobacco: Never Used  . Alcohol Use: No     Comment: once a month  . Drug Use: No  . Sexual Activity: Yes    Birth Control/ Protection: Surgical     Comment: Hysterectomy   Other Topics Concern  . None   Social History Narrative  . None    REPRODUCTIVE HISTORY AND PERSONAL RISK ASSESSMENT FACTORS: Menarche was at age 14.   postmenopausal Uterus Intact: no, age 41 due to endometriosis Ovaries Intact: yes, but only one. G2P2A0, first live birth  at age 77  She has not previously undergone treatment for infertility.   Oral Contraceptive use: 12 years   She has used HRT in the past.    FAMILY HISTORY:  We obtained a detailed, 4-generation family history.  Significant diagnoses are listed below: Family History  Problem Relation Age of Onset  . Lung cancer Maternal Uncle     smoker  . Breast cancer Paternal Aunt     dx in her late 36s; maternal half sister to patient's father  . Breast cancer Maternal Grandmother 40  . Lung cancer Maternal Uncle     smoker  . Hypertension Father   . Heart disease Father   . Parkinsonism Father   . Skin cancer Father   . Hypercholesterolemia Mother   . Gallstones Mother   . Healthy Brother   . COPD Maternal Grandfather   . Lung cancer Cousin     smoker; paternal cousin   Patient's maternal ancestors are of [place] descent, and paternal ancestors are of unknown descent. There is no reported Ashkenazi Jewish ancestry. There is no known consanguinity.  GENETIC COUNSELING ASSESSMENT: [name] is a 53 y.o. female with a personal history of triple negative breast cancer and family history of breast cancer which somewhat suggestive of a hereditary breast cancer syndrome and predisposition to cancer. We, therefore, discussed and recommended the  following at today's visit.   DISCUSSION: We reviewed the characteristics, features and inheritance patterns of hereditary cancer syndromes. We also discussed genetic testing, including the appropriate family members to test, the process of testing, insurance coverage and turn-around-time for results. We reviewed that her aunt and grandmother were diagnosed with postmenopausal breast cancer, which is less concerning for hereditary cancer syndromes than if they were diagnosed under the age of 10-50.  Based on her triple negative status, we can offer testing to Natalie Arias.  WE reviewed the hereditary genes, including BRCA1, BRCA2 and PALB2, that  are associated more commonly with triple negative breast cancer.  PLAN: After considering the risks, benefits, and limitations, [name] provided informed consent to pursue genetic testing and the blood sample will be sent to [facility] for analysis of the Breast/Ovarian Cancer panel. We discussed the implications of a positive, negative and/ or variant of uncertain significance genetic test result. Results should be available within approximately 3 weeks' time, at which point they will be disclosed by telephone to Natalie Arias, as will any additional recommendations warranted by these results. Real Cons [name] will receive a summary of her genetic counseling visit and a copy of her results once available. This information will also be available in Epic. We encouraged Natalie Arias to remain in contact with cancer genetics annually so that we can continuously update the family history and inform her of any changes in cancer genetics and testing that may be of benefit for her family. Natalie Arias's questions were answered to her satisfaction today. Our contact information was provided should additional questions or concerns arise.  The patient was seen for a total of 45 minutes, greater than 50% of which was spent face-to-face counseling.  This note will also be sent to the referring provider via the electronic medical record. The patient will be supplied with a summary of this genetic counseling discussion as well as educational information on the discussed hereditary cancer syndromes following the conclusion of their visit.   Patient was discussed with Dr. Marcy Panning.   _______________________________________________________________________ For Office Staff:  Number of people involved in session: 2 Was an Intern/ student involved with case: no

## 2014-01-07 ENCOUNTER — Ambulatory Visit (HOSPITAL_BASED_OUTPATIENT_CLINIC_OR_DEPARTMENT_OTHER): Payer: BC Managed Care – PPO | Admitting: Adult Health

## 2014-01-07 ENCOUNTER — Encounter: Payer: Self-pay | Admitting: Adult Health

## 2014-01-07 ENCOUNTER — Ambulatory Visit (HOSPITAL_BASED_OUTPATIENT_CLINIC_OR_DEPARTMENT_OTHER): Payer: BC Managed Care – PPO

## 2014-01-07 ENCOUNTER — Other Ambulatory Visit: Payer: BC Managed Care – PPO

## 2014-01-07 VITALS — BP 126/80 | HR 130 | Temp 98.6°F | Resp 18 | Ht 66.0 in | Wt 200.7 lb

## 2014-01-07 DIAGNOSIS — C50319 Malignant neoplasm of lower-inner quadrant of unspecified female breast: Secondary | ICD-10-CM

## 2014-01-07 DIAGNOSIS — Z5111 Encounter for antineoplastic chemotherapy: Secondary | ICD-10-CM

## 2014-01-07 DIAGNOSIS — C50311 Malignant neoplasm of lower-inner quadrant of right female breast: Secondary | ICD-10-CM

## 2014-01-07 DIAGNOSIS — Z171 Estrogen receptor negative status [ER-]: Secondary | ICD-10-CM

## 2014-01-07 MED ORDER — ONDANSETRON 16 MG/50ML IVPB (CHCC)
INTRAVENOUS | Status: AC
  Filled 2014-01-07: qty 16

## 2014-01-07 MED ORDER — DIPHENHYDRAMINE HCL 50 MG/ML IJ SOLN
50.0000 mg | Freq: Once | INTRAMUSCULAR | Status: AC
  Administered 2014-01-07: 50 mg via INTRAVENOUS

## 2014-01-07 MED ORDER — SODIUM CHLORIDE 0.9 % IV SOLN
80.0000 mg/m2 | Freq: Once | INTRAVENOUS | Status: AC
  Administered 2014-01-07: 162 mg via INTRAVENOUS
  Filled 2014-01-07: qty 27

## 2014-01-07 MED ORDER — SODIUM CHLORIDE 0.9 % IJ SOLN
10.0000 mL | INTRAMUSCULAR | Status: DC | PRN
  Administered 2014-01-07: 10 mL
  Filled 2014-01-07: qty 10

## 2014-01-07 MED ORDER — DIPHENHYDRAMINE HCL 50 MG/ML IJ SOLN
INTRAMUSCULAR | Status: AC
  Filled 2014-01-07: qty 1

## 2014-01-07 MED ORDER — DEXAMETHASONE SODIUM PHOSPHATE 20 MG/5ML IJ SOLN
INTRAMUSCULAR | Status: AC
Start: 2014-01-07 — End: 2014-01-07
  Filled 2014-01-07: qty 5

## 2014-01-07 MED ORDER — ONDANSETRON 16 MG/50ML IVPB (CHCC)
16.0000 mg | Freq: Once | INTRAVENOUS | Status: AC
  Administered 2014-01-07: 16 mg via INTRAVENOUS

## 2014-01-07 MED ORDER — SODIUM CHLORIDE 0.9 % IV SOLN
300.0000 mg | Freq: Once | INTRAVENOUS | Status: AC
  Administered 2014-01-07: 300 mg via INTRAVENOUS
  Filled 2014-01-07: qty 30

## 2014-01-07 MED ORDER — SODIUM CHLORIDE 0.9 % IV SOLN
Freq: Once | INTRAVENOUS | Status: AC
  Administered 2014-01-07: 15:00:00 via INTRAVENOUS

## 2014-01-07 MED ORDER — HEPARIN SOD (PORK) LOCK FLUSH 100 UNIT/ML IV SOLN
500.0000 [IU] | Freq: Once | INTRAVENOUS | Status: AC | PRN
  Administered 2014-01-07: 500 [IU]
  Filled 2014-01-07: qty 5

## 2014-01-07 MED ORDER — DEXAMETHASONE SODIUM PHOSPHATE 20 MG/5ML IJ SOLN
20.0000 mg | Freq: Once | INTRAMUSCULAR | Status: AC
  Administered 2014-01-07: 20 mg via INTRAVENOUS

## 2014-01-07 MED ORDER — FAMOTIDINE IN NACL 20-0.9 MG/50ML-% IV SOLN
20.0000 mg | Freq: Once | INTRAVENOUS | Status: AC
  Administered 2014-01-07: 20 mg via INTRAVENOUS

## 2014-01-07 MED ORDER — FAMOTIDINE IN NACL 20-0.9 MG/50ML-% IV SOLN
INTRAVENOUS | Status: AC
  Filled 2014-01-07: qty 50

## 2014-01-07 NOTE — Patient Instructions (Signed)
[facility] Discharge Instructions for Patients Receiving Chemotherapy  Today you received the following chemotherapy agents Taxol/Carboplatin To help prevent nausea and vomiting after your treatment, we encourage you to take your nausea medication as prescribed.  If you develop nausea and vomiting that is not controlled by your nausea medication, call the clinic.   BELOW ARE SYMPTOMS THAT SHOULD BE REPORTED IMMEDIATELY:  *FEVER GREATER THAN 100.5 F  *CHILLS WITH OR WITHOUT FEVER  NAUSEA AND VOMITING THAT IS NOT CONTROLLED WITH YOUR NAUSEA MEDICATION  *UNUSUAL SHORTNESS OF BREATH  *UNUSUAL BRUISING OR BLEEDING  TENDERNESS IN MOUTH AND THROAT WITH OR WITHOUT PRESENCE OF ULCERS  *URINARY PROBLEMS  *BOWEL PROBLEMS  UNUSUAL RASH Items with * indicate a potential emergency and should be followed up as soon as possible.  Feel free to call the clinic you have any questions or concerns. The clinic phone number is [phone].    

## 2014-01-07 NOTE — Progress Notes (Signed)
[place]  Telephone:([phone]) [phone] Fax:(336) [phone]  OFFICE PROGRESS NOTE   ID: Natalie Arias   DOB: 18-Mar-1961  MR#: [id number]  UYQ#:[id number]   PCP: [name], PA-C SU: Fanny Skates, MD RAD ONC:  Kyung Rudd, MD   DIAGNOSES: Natalie Arias is a 53 y.o. 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   STAGE:  Breast cancer of lower-inner quadrant of right female breast   Primary site: Breast (Right)   Staging method: AJCC 7th Edition   Clinical: Stage IIA (T2, N0, cM0)   Summary: Stage IIA (T2, N0, cM0)   PRIOR THERAPY: #1 Right breast needle core biopsy performed on 09/30/2013 resulting in pathology that revealed an invasive mammary carcinoma likely ductal phenotype, intermediate grade, with a tumor that is estrogen receptor negative, progesterone receptor negative, HER-2/neu negative, with a Ki-67 80%.  #2 Bilateral breast MRI on 10/11/2013 showed within the right breast there was an irregularly marginated enhancing mass with central necrosis located in the upper-inner quadrant at the 2 o'clock position (the middle 1/3).  The mass measures 3.9 x 3.7 x 3.1 cm in size and was associated with clip artifact.  There were no additional areas of worrisome enhancement within the right breast.  The left breast did not have mass or abnormal enhancement.  No abnormal appearing lymph nodes (clinical stage IIA T2 N0).  #3 Patient was recommended neoadjuvant chemotherapy utilizing Adriamycin/Cytoxan x 4 cycles with neulasta support followed by Taxol/Carbo weekly x 12 weeks.      #4 Genetic testing on 01/06/14, results pending.    CURRENT THERAPY: Taxol/Carbo week two  INTERVAL HISTORY: Natalie Arias was seen today for evaluation prior to her second cycle of carbo taxol.  She is doing well today.  She saw Dr. Dalbert Batman and was very pleased with  her appointment with him.  He conveyed to them that her tumor was about half the size since starting chemotherapy and she was very encouraged with their appointment.  She also met with Roma Kayser, our genetic counselor on 01/06/14 and underwent genetic testing.  Otherwise, she tolerated her first cycle of Taxol Carbo without difficulty.  She denies fevers, chills, nausea, vomiting, constipation, diarrhea, numbness, or any further concerns.    PAST MEDICAL HISTORY: Past Medical History  Diagnosis Date  . Asthma 20's    allergic asthema  . Seasonal allergies   . PONV (postoperative nausea and vomiting)   . Breast cancer dx'd 10/01/2013    right; triplen negative    PAST SURGICAL HISTORY: Past Surgical History  Procedure Laterality Date  . Laparoscopic endometriosis fulguration  1991  . Abdominal hysterectomy  1992  . Portacath placement N/A 10/18/2013    Procedure: INSERTION PORT-A-CATH WITH ULTRASOUND;  Surgeon: Adin Hector, MD;  Location: [facility];  Service: General;  Laterality: N/A;    FAMILY HISTORY: Family History  Problem Relation Age of Onset  . Lung cancer Maternal Uncle     smoker  . Breast cancer Paternal Aunt     dx in her late 56s; maternal half sister to patient's father  . Breast cancer Maternal Grandmother 58  . Lung cancer Maternal Uncle     smoker  . Hypertension Father   . Heart disease Father   . Parkinsonism Father   . Skin cancer Father   . Hypercholesterolemia Mother   . Gallstones Mother   .  Healthy Brother   . COPD Maternal Grandfather   . Lung cancer Cousin     smoker; paternal cousin    SOCIAL HISTORY: History  Substance Use Topics  . Smoking status: Never Smoker   . Smokeless tobacco: Never Used  . Alcohol Use: No     Comment: once a month    ALLERGIES: Allergies  Allergen Reactions  . Penicillins Hives    CURRENT MEDICATIONS: Current Outpatient Prescriptions  Medication Sig Dispense Refill  . dexamethasone (DECADRON) 4 MG  tablet Take 2 tablets (8 mg total) by mouth 2 (two) times daily with a meal. Take two times a day starting the day after chemotherapy for 3 days.  30 tablet  1  . ibuprofen (ADVIL,MOTRIN) 200 MG tablet Take 200 mg by mouth every 6 (six) hours as needed for pain.      Marland Kitchen lidocaine-prilocaine (EMLA) cream Apply topically as needed. To port-a-cath 1.5 hours before chemotherapy and cover  30 g  1  . ondansetron (ZOFRAN) 8 MG tablet Take 1 tablet (8 mg total) by mouth 2 (two) times daily. Take two times a day starting the day after chemo for 3 days. Then take two times a day as needed for nausea or vomiting.  30 tablet  1  . prochlorperazine (COMPAZINE) 10 MG tablet Take 1 tablet (10 mg total) by mouth every 6 (six) hours as needed (Nausea or vomiting).  30 tablet  1  . SENNA PO Take by mouth.      . hydrocortisone (ANUSOL-HC) 2.5 % rectal cream Place 1 application rectally 2 (two) times daily.  30 g  0  . LORazepam (ATIVAN) 0.5 MG tablet Take 1 tablet (0.5 mg total) by mouth every 6 (six) hours as needed (Nausea or vomiting).  30 tablet  0  . polyethylene glycol (MIRALAX / GLYCOLAX) packet Take 17 g by mouth daily as needed.      Marland Kitchen UNABLE TO FIND 1 each by Other route as needed. Cranial Prosthesis       No current facility-administered medications for this visit.   REVIEW OF SYSTEMS:  A 10 point review of systems was conducted and is otherwise negative except for what is noted above.    PHYSICAL EXAMINATION: Blood pressure 126/80, pulse 130, temperature 98.6 F (37 C), temperature source Oral, resp. rate 18, height '5\' 6"'  (1.676 m), weight 200 lb 11.2 oz (91.037 kg). General: Patient is a well appearing female in no acute distress HEENT: PERRLA, sclerae anicteric no conjunctival pallor, MMM Neck: supple, no palpable adenopathy Lungs: clear to auscultation bilaterally, no wheezes, rhonchi, or rales Cardiovascular: regular rate rhythm, S1, S2, no murmurs, rubs or gallops Abdomen: Soft, non-tender,  non-distended, normoactive bowel sounds, no HSM Extremities: warm and well perfused, no clubbing, cyanosis, or edema Skin: No rashes or lesions Neuro: Non-focal Breasts: right breast with approximately 2cm palpable soft mass ECOG FS: 1 - Symptomatic but completely ambulatory  LABORATORIES:   Chemistry      Component Value Date/Time   NA 141 01/06/2014 1047   NA 140 10/14/2013 0930   K 4.4 01/06/2014 1047   K 4.7 10/14/2013 0930   CL 105 10/14/2013 0930   CO2 26 01/06/2014 1047   CO2 29 10/14/2013 0930   BUN 12.8 01/06/2014 1047   BUN 12 10/14/2013 0930   CREATININE 0.7 01/06/2014 1047   CREATININE 0.71 10/14/2013 0930      Component Value Date/Time   CALCIUM 9.2 01/06/2014 1047   CALCIUM 9.6 10/14/2013 0930  ALKPHOS 61 01/06/2014 1047   ALKPHOS 70 10/14/2013 0930   AST 17 01/06/2014 1047   AST 16 10/14/2013 0930   ALT 19 01/06/2014 1047   ALT 18 10/14/2013 0930   BILITOT 0.37 01/06/2014 1047   BILITOT 0.3 10/14/2013 0930      Lab Results  Component Value Date   WBC 5.5 01/06/2014   HGB 10.5* 01/06/2014   HCT 31.6* 01/06/2014   MCV 92.9 01/06/2014   PLT 239 01/06/2014    STUDIES/IMAGING: 1.  2-D echocardiogram on 10/11/2013 showed a left ventricular ejection fraction of 60% - 65%.  ASSESSMENT: Natalie Arias is a 53 y.o. female diagnosed with triple negative invasive ductal carcinoma of the right breast in 09/2013:  #1 Intermediate grade invasive ductal carcinoma of the right breast. The mass measures 3.5 cm by ultrasound. Prognostic markers revealed the tumor to be estrogen receptor negative, progesterone receptor negative, HER-2/neu negative, with an elevated proliferation marker Ki-67 of 80%.   #2  Bilateral breast MRI on 10/11/2013 showed within the right breast there was an irregularly marginated enhancing mass with central necrosis located in the upper-inner quadrant at the 2 o'clock position (the middle 1/3).  The mass measures 3.9 x 3.7 x 3.1 cm in size and was associated with clip  artifact.  There were no additional areas of worrisome enhancement within the right breast.  The left breast did not have mass or abnormal enhancement.  No abnormal appearing lymph nodes (clinical stage IIA T2 N0).    #3  Patient started neoadjuvant chemotherapy of dose dense Adriamycin/Cytoxan with 4 planned doses on 10/27/2013.  Day 2 granulocyte support with [name] injection.  This is scheduled to be followed by neoadjuvant chemotherapy consisting of Taxol/carbo weekly x 12 weeks.  #4  If patient does successfully undergo lumpectomy she will need radiation therapy.  #5 Patient has a family history of breast cancer and herself has triple negative breast cancer and underwent genetic counseling and testing on 01/06/14, results are pending.   PLAN:   #1 Patient is doing well.  Her labs are stable, she will proceed with her second weekly dose of Taxol/Carbo today.   #2  She will return in one week for labs, evaluation, and her third weekly cycle of Taxol and Carboplatin.     All questions answered.  Natalie Arias was encouraged to contact us in the interim with any questions, concerns, or problems.  I spent 25 minutes counseling the patient face to face.  The total time spent in the appointment was 30 minutes.  Minette Headland, [place] [phone] 01/08/2014  9:54 AM

## 2014-01-14 ENCOUNTER — Telehealth: Payer: Self-pay | Admitting: *Deleted

## 2014-01-14 ENCOUNTER — Telehealth: Payer: Self-pay | Admitting: Adult Health

## 2014-01-14 ENCOUNTER — Other Ambulatory Visit (HOSPITAL_BASED_OUTPATIENT_CLINIC_OR_DEPARTMENT_OTHER): Payer: BC Managed Care – PPO

## 2014-01-14 ENCOUNTER — Ambulatory Visit (HOSPITAL_BASED_OUTPATIENT_CLINIC_OR_DEPARTMENT_OTHER): Payer: BC Managed Care – PPO | Admitting: Adult Health

## 2014-01-14 ENCOUNTER — Encounter: Payer: Self-pay | Admitting: Adult Health

## 2014-01-14 ENCOUNTER — Encounter: Payer: Self-pay | Admitting: Oncology

## 2014-01-14 ENCOUNTER — Ambulatory Visit (HOSPITAL_BASED_OUTPATIENT_CLINIC_OR_DEPARTMENT_OTHER): Payer: BC Managed Care – PPO

## 2014-01-14 VITALS — BP 114/68 | HR 91 | Temp 98.1°F | Resp 18 | Ht 66.0 in | Wt 198.0 lb

## 2014-01-14 DIAGNOSIS — Z171 Estrogen receptor negative status [ER-]: Secondary | ICD-10-CM

## 2014-01-14 DIAGNOSIS — Z803 Family history of malignant neoplasm of breast: Secondary | ICD-10-CM

## 2014-01-14 DIAGNOSIS — C50311 Malignant neoplasm of lower-inner quadrant of right female breast: Secondary | ICD-10-CM

## 2014-01-14 DIAGNOSIS — C50219 Malignant neoplasm of upper-inner quadrant of unspecified female breast: Secondary | ICD-10-CM

## 2014-01-14 DIAGNOSIS — Z5111 Encounter for antineoplastic chemotherapy: Secondary | ICD-10-CM

## 2014-01-14 LAB — COMPREHENSIVE METABOLIC PANEL (CC13)
ALBUMIN: 3.8 g/dL (ref 3.5–5.0)
ALT: 23 U/L (ref 0–55)
AST: 16 U/L (ref 5–34)
Alkaline Phosphatase: 59 U/L (ref 40–150)
Anion Gap: 9 mEq/L (ref 3–11)
BUN: 10.7 mg/dL (ref 7.0–26.0)
CHLORIDE: 105 meq/L (ref 98–109)
CO2: 27 mEq/L (ref 22–29)
CREATININE: 0.7 mg/dL (ref 0.6–1.1)
Calcium: 9.3 mg/dL (ref 8.4–10.4)
Glucose: 108 mg/dl (ref 70–140)
POTASSIUM: 4 meq/L (ref 3.5–5.1)
SODIUM: 141 meq/L (ref 136–145)
Total Bilirubin: 0.45 mg/dL (ref 0.20–1.20)
Total Protein: 6.1 g/dL — ABNORMAL LOW (ref 6.4–8.3)

## 2014-01-14 LAB — CBC WITH DIFFERENTIAL/PLATELET
BASO%: 0.8 % (ref 0.0–2.0)
Basophils Absolute: 0.1 10*3/uL (ref 0.0–0.1)
EOS%: 2.7 % (ref 0.0–7.0)
Eosinophils Absolute: 0.2 10*3/uL (ref 0.0–0.5)
HCT: 31.3 % — ABNORMAL LOW (ref 34.8–46.6)
HGB: 10.7 g/dL — ABNORMAL LOW (ref 11.6–15.9)
LYMPH#: 0.7 10*3/uL — AB (ref 0.9–3.3)
LYMPH%: 11.3 % — ABNORMAL LOW (ref 14.0–49.7)
MCH: 32.3 pg (ref 25.1–34.0)
MCHC: 34.1 g/dL (ref 31.5–36.0)
MCV: 94.5 fL (ref 79.5–101.0)
MONO#: 0.4 10*3/uL (ref 0.1–0.9)
MONO%: 7.2 % (ref 0.0–14.0)
NEUT#: 4.9 10*3/uL (ref 1.5–6.5)
NEUT%: 78 % — ABNORMAL HIGH (ref 38.4–76.8)
Platelets: 194 10*3/uL (ref 145–400)
RBC: 3.31 10*6/uL — ABNORMAL LOW (ref 3.70–5.45)
RDW: 17.5 % — AB (ref 11.2–14.5)
WBC: 6.2 10*3/uL (ref 3.9–10.3)

## 2014-01-14 MED ORDER — HEPARIN SOD (PORK) LOCK FLUSH 100 UNIT/ML IV SOLN
500.0000 [IU] | Freq: Once | INTRAVENOUS | Status: AC | PRN
  Administered 2014-01-14: 500 [IU]
  Filled 2014-01-14: qty 5

## 2014-01-14 MED ORDER — SODIUM CHLORIDE 0.9 % IV SOLN
80.0000 mg/m2 | Freq: Once | INTRAVENOUS | Status: AC
  Administered 2014-01-14: 162 mg via INTRAVENOUS
  Filled 2014-01-14: qty 27

## 2014-01-14 MED ORDER — DIPHENHYDRAMINE HCL 50 MG/ML IJ SOLN
50.0000 mg | Freq: Once | INTRAMUSCULAR | Status: AC
  Administered 2014-01-14: 50 mg via INTRAVENOUS

## 2014-01-14 MED ORDER — DIPHENHYDRAMINE HCL 50 MG/ML IJ SOLN
INTRAMUSCULAR | Status: AC
  Filled 2014-01-14: qty 1

## 2014-01-14 MED ORDER — DEXAMETHASONE SODIUM PHOSPHATE 20 MG/5ML IJ SOLN
20.0000 mg | Freq: Once | INTRAMUSCULAR | Status: AC
  Administered 2014-01-14: 20 mg via INTRAVENOUS

## 2014-01-14 MED ORDER — DEXAMETHASONE SODIUM PHOSPHATE 20 MG/5ML IJ SOLN
INTRAMUSCULAR | Status: AC
  Filled 2014-01-14: qty 5

## 2014-01-14 MED ORDER — ONDANSETRON 16 MG/50ML IVPB (CHCC)
INTRAVENOUS | Status: AC
  Filled 2014-01-14: qty 16

## 2014-01-14 MED ORDER — DEXTROSE 5 % IV SOLN: 80.0000 mg/m2 | Freq: Once | INTRAVENOUS | Status: DC

## 2014-01-14 MED ORDER — FAMOTIDINE IN NACL 20-0.9 MG/50ML-% IV SOLN
20.0000 mg | Freq: Once | INTRAVENOUS | Status: AC
  Administered 2014-01-14: 20 mg via INTRAVENOUS

## 2014-01-14 MED ORDER — ONDANSETRON 16 MG/50ML IVPB (CHCC)
16.0000 mg | Freq: Once | INTRAVENOUS | Status: AC
  Administered 2014-01-14: 16 mg via INTRAVENOUS

## 2014-01-14 MED ORDER — SODIUM CHLORIDE 0.9 % IJ SOLN
10.0000 mL | INTRAMUSCULAR | Status: DC | PRN
  Administered 2014-01-14: 10 mL
  Filled 2014-01-14: qty 10

## 2014-01-14 MED ORDER — SODIUM CHLORIDE 0.9 % IV SOLN
Freq: Once | INTRAVENOUS | Status: AC
  Administered 2014-01-14: 15:00:00 via INTRAVENOUS

## 2014-01-14 MED ORDER — SODIUM CHLORIDE 0.9 % IV SOLN
300.0000 mg | Freq: Once | INTRAVENOUS | Status: AC
  Administered 2014-01-14: 300 mg via INTRAVENOUS
  Filled 2014-01-14: qty 30

## 2014-01-14 MED ORDER — FAMOTIDINE IN NACL 20-0.9 MG/50ML-% IV SOLN
INTRAVENOUS | Status: AC
  Filled 2014-01-14: qty 50

## 2014-01-14 NOTE — Patient Instructions (Signed)
[facility] Discharge Instructions for Patients Receiving Chemotherapy  Today you received the following chemotherapy agents taxol and carboplatin.  To help prevent nausea and vomiting after your treatment, we encourage you to take your nausea medication zofran and compazine.   If you develop nausea and vomiting that is not controlled by your nausea medication, call the clinic.   BELOW ARE SYMPTOMS THAT SHOULD BE REPORTED IMMEDIATELY:  *FEVER GREATER THAN 100.5 F  *CHILLS WITH OR WITHOUT FEVER  NAUSEA AND VOMITING THAT IS NOT CONTROLLED WITH YOUR NAUSEA MEDICATION  *UNUSUAL SHORTNESS OF BREATH  *UNUSUAL BRUISING OR BLEEDING  TENDERNESS IN MOUTH AND THROAT WITH OR WITHOUT PRESENCE OF ULCERS  *URINARY PROBLEMS  *BOWEL PROBLEMS  UNUSUAL RASH Items with * indicate a potential emergency and should be followed up as soon as possible.  Feel free to call the clinic you have any questions or concerns. The clinic phone number is [phone].    

## 2014-01-14 NOTE — Progress Notes (Signed)
[facility]  Telephone:([phone] Fax:(336) [phone]  OFFICE PROGRESS NOTE   ID: Natalie Arias   DOB: [birth year]  MR#: [id number]  QZE#:[id number]   PCP: [name], PA-C SU: Fanny Skates, MD RAD ONC:  Kyung Rudd, MD   DIAGNOSES: Natalie Arias is a 53 y.o. 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   STAGE:  Breast cancer of lower-inner quadrant of right female breast   Primary site: Breast (Right)   Staging method: AJCC 7th Edition   Clinical: Stage IIA (T2, N0, cM0)   Summary: Stage IIA (T2, N0, cM0)   PRIOR THERAPY: #1 Right breast needle core biopsy performed on 09/30/2013 resulting in pathology that revealed an invasive mammary carcinoma likely ductal phenotype, intermediate grade, with a tumor that is estrogen receptor negative, progesterone receptor negative, HER-2/neu negative, with a Ki-67 80%.  #2 Bilateral breast MRI on 10/11/2013 showed within the right breast there was an irregularly marginated enhancing mass with central necrosis located in the upper-inner quadrant at the 2 o'clock position (the middle 1/3).  The mass measures 3.9 x 3.7 x 3.1 cm in size and was associated with clip artifact.  There were no additional areas of worrisome enhancement within the right breast.  The left breast did not have mass or abnormal enhancement.  No abnormal appearing lymph nodes (clinical stage IIA T2 N0).  #3 Patient was recommended neoadjuvant chemotherapy utilizing Adriamycin/Cytoxan x 4 cycles with neulasta support followed by Taxol/Carbo weekly x 12 weeks.      #4 Genetic testing on 01/06/14, results pending.    CURRENT THERAPY: Taxol/Carbo week three  INTERVAL HISTORY: Natalie Arias was seen today for evaluation prior to her third cycle of carbo taxol.  She is doing well today.  She denies fevers, chills, nausea, vomiting,  constipation, diarrhea, numbness, nail changes, mucositis, or any further concerns.  She is planning on going to the [place] for work February 3 and 4.    PAST MEDICAL HISTORY: Past Medical History  Diagnosis Date  . Asthma 20's    allergic asthema  . Seasonal allergies   . PONV (postoperative nausea and vomiting)   . Breast cancer dx'd 10/01/2013    right; triplen negative    PAST SURGICAL HISTORY: Past Surgical History  Procedure Laterality Date  . Laparoscopic endometriosis fulguration  1991  . Abdominal hysterectomy  1992  . Portacath placement N/A 10/18/2013    Procedure: INSERTION PORT-A-CATH WITH ULTRASOUND;  Surgeon: Adin Hector, MD;  Location: [facility];  Service: General;  Laterality: N/A;    FAMILY HISTORY: Family History  Problem Relation Age of Onset  . Lung cancer Maternal Uncle     smoker  . Breast cancer Paternal Aunt     dx in her late 9s; maternal half sister to patient's father  . Breast cancer Maternal Grandmother 36  . Lung cancer Maternal Uncle     smoker  . Hypertension Father   . Heart disease Father   . Parkinsonism Father   . Skin cancer Father   . Hypercholesterolemia Mother   . Gallstones Mother   . Healthy Brother   . COPD Maternal Grandfather   . Lung cancer Cousin     smoker; paternal cousin    SOCIAL HISTORY: History  Substance Use Topics  . Smoking status: Never Smoker   . Smokeless tobacco: Never Used  . Alcohol Use:  No     Comment: once a month    ALLERGIES: Allergies  Allergen Reactions  . Penicillins Hives    CURRENT MEDICATIONS: Current Outpatient Prescriptions  Medication Sig Dispense Refill  . hydrocortisone (ANUSOL-HC) 2.5 % rectal cream Place 1 application rectally 2 (two) times daily.  30 g  0  . lidocaine-prilocaine (EMLA) cream Apply topically as needed. To port-a-cath 1.5 hours before chemotherapy and cover  30 g  1  . polyethylene glycol (MIRALAX / GLYCOLAX) packet Take 17 g by mouth daily as needed.      .  SENNA PO Take by mouth.      . dexamethasone (DECADRON) 4 MG tablet Take 2 tablets (8 mg total) by mouth 2 (two) times daily with a meal. Take two times a day starting the day after chemotherapy for 3 days.  30 tablet  1  . ibuprofen (ADVIL,MOTRIN) 200 MG tablet Take 200 mg by mouth every 6 (six) hours as needed for pain.      Marland Kitchen LORazepam (ATIVAN) 0.5 MG tablet Take 1 tablet (0.5 mg total) by mouth every 6 (six) hours as needed (Nausea or vomiting).  30 tablet  0  . ondansetron (ZOFRAN) 8 MG tablet Take 1 tablet (8 mg total) by mouth 2 (two) times daily. Take two times a day starting the day after chemo for 3 days. Then take two times a day as needed for nausea or vomiting.  30 tablet  1  . prochlorperazine (COMPAZINE) 10 MG tablet Take 1 tablet (10 mg total) by mouth every 6 (six) hours as needed (Nausea or vomiting).  30 tablet  1  . UNABLE TO FIND 1 each by Other route as needed. Cranial Prosthesis       No current facility-administered medications for this visit.   REVIEW OF SYSTEMS:  A 10 point review of systems was conducted and is otherwise negative except for what is noted above.    PHYSICAL EXAMINATION: Blood pressure 114/68, pulse 91, temperature 98.1 F (36.7 C), temperature source Oral, resp. rate 18, height $RemoveBe'5\' 6"'jtiMHyQvW$  (1.676 m), weight 198 lb (89.812 kg). GENERAL: Patient is a well appearing female in no acute distress HEENT:  Sclerae anicteric.  Oropharynx clear and moist. No ulcerations or evidence of oropharyngeal candidiasis. Neck is supple.  NODES:  No cervical, supraclavicular, or axillary lymphadenopathy palpated.  BREAST EXAM:  Deferred. LUNGS:  Clear to auscultation bilaterally.  No wheezes or rhonchi. HEART:  Regular rate and rhythm. No murmur appreciated. ABDOMEN:  Soft, nontender.  Positive, normoactive bowel sounds. No organomegaly palpated. MSK:  No focal spinal tenderness to palpation. Full range of motion bilaterally in the upper extremities. EXTREMITIES:  No  peripheral edema.   SKIN:  Clear with no obvious rashes or skin changes. No nail dyscrasia. NEURO:  Nonfocal. Well oriented.  Appropriate affect. ECOG FS: 1 - Symptomatic but completely ambulatory  LABORATORIES:   Chemistry      Component Value Date/Time   NA 141 01/14/2014 1201   NA 140 10/14/2013 0930   K 4.0 01/14/2014 1201   K 4.7 10/14/2013 0930   CL 105 10/14/2013 0930   CO2 27 01/14/2014 1201   CO2 29 10/14/2013 0930   BUN 10.7 01/14/2014 1201   BUN 12 10/14/2013 0930   CREATININE 0.7 01/14/2014 1201   CREATININE 0.71 10/14/2013 0930      Component Value Date/Time   CALCIUM 9.3 01/14/2014 1201   CALCIUM 9.6 10/14/2013 0930   ALKPHOS 59 01/14/2014 1201  ALKPHOS 70 10/14/2013 0930   AST 16 01/14/2014 1201   AST 16 10/14/2013 0930   ALT 23 01/14/2014 1201   ALT 18 10/14/2013 0930   BILITOT 0.45 01/14/2014 1201   BILITOT 0.3 10/14/2013 0930      Lab Results  Component Value Date   WBC 6.2 01/14/2014   HGB 10.7* 01/14/2014   HCT 31.3* 01/14/2014   MCV 94.5 01/14/2014   PLT 194 01/14/2014    STUDIES/IMAGING: 1.  2-D echocardiogram on 10/11/2013 showed a left ventricular ejection fraction of 60% - 65%.  ASSESSMENT: Natalie Arias is a 53 y.o. female diagnosed with triple negative invasive ductal carcinoma of the right breast in 09/2013:  #1 Intermediate grade invasive ductal carcinoma of the right breast. The mass measures 3.5 cm by ultrasound. Prognostic markers revealed the tumor to be estrogen receptor negative, progesterone receptor negative, HER-2/neu negative, with an elevated proliferation marker Ki-67 of 80%.   #2  Bilateral breast MRI on 10/11/2013 showed within the right breast there was an irregularly marginated enhancing mass with central necrosis located in the upper-inner quadrant at the 2 o'clock position (the middle 1/3).  The mass measures 3.9 x 3.7 x 3.1 cm in size and was associated with clip artifact.  There were no additional areas of worrisome enhancement  within the right breast.  The left breast did not have mass or abnormal enhancement.  No abnormal appearing lymph nodes (clinical stage IIA T2 N0).    #3  Patient started neoadjuvant chemotherapy of dose dense Adriamycin/Cytoxan with 4 planned doses on 10/27/2013.  Day 2 granulocyte support with [name] injection.  This is scheduled to be followed by neoadjuvant chemotherapy consisting of Taxol/carbo weekly x 12 weeks.  #4  If patient does successfully undergo lumpectomy she will need radiation therapy.  #5 Patient has a family history of breast cancer and herself has triple negative breast cancer and underwent genetic counseling and testing on 01/06/14, results are pending.   PLAN:   #1 Natalie Arias is doing well today.  Her labs are stable.  She will proceed with chemotherapy.    #2 I will request her medical records and we have discussed how to safely travel in regards to hand hygeine and frequent walks to prevent DVT.   #3  Natalie Arias will return next week for labs, evaluation, and week four of Taxol Carbo.   We will continue to monitor closely for any signs of neuropathy.    All questions answered.  Natalie Arias was encouraged to contact us in the interim with any questions, concerns, or problems.  I spent 25 minutes counseling the patient face to face.  The total time spent in the appointment was 30 minutes.  Minette Headland, [place] [phone] 01/16/2014  10:26 AM

## 2014-01-14 NOTE — Telephone Encounter (Signed)
, °

## 2014-01-14 NOTE — Telephone Encounter (Signed)
Per staff message I have adjusted 2/13 appts

## 2014-01-19 ENCOUNTER — Telehealth: Payer: Self-pay | Admitting: Genetic Counselor

## 2014-01-19 NOTE — Telephone Encounter (Signed)
Left good news message and asked that she call back.

## 2014-01-21 ENCOUNTER — Encounter: Payer: Self-pay | Admitting: Genetic Counselor

## 2014-01-21 ENCOUNTER — Other Ambulatory Visit (HOSPITAL_BASED_OUTPATIENT_CLINIC_OR_DEPARTMENT_OTHER): Payer: BC Managed Care – PPO

## 2014-01-21 ENCOUNTER — Ambulatory Visit (HOSPITAL_BASED_OUTPATIENT_CLINIC_OR_DEPARTMENT_OTHER): Payer: BC Managed Care – PPO

## 2014-01-21 ENCOUNTER — Encounter: Payer: Self-pay | Admitting: Oncology

## 2014-01-21 ENCOUNTER — Ambulatory Visit (HOSPITAL_BASED_OUTPATIENT_CLINIC_OR_DEPARTMENT_OTHER): Payer: BC Managed Care – PPO | Admitting: Adult Health

## 2014-01-21 ENCOUNTER — Encounter: Payer: Self-pay | Admitting: Adult Health

## 2014-01-21 ENCOUNTER — Telehealth: Payer: Self-pay | Admitting: Genetic Counselor

## 2014-01-21 VITALS — BP 140/80 | HR 114 | Temp 98.6°F | Resp 20 | Ht 66.0 in | Wt 202.6 lb

## 2014-01-21 DIAGNOSIS — C50219 Malignant neoplasm of upper-inner quadrant of unspecified female breast: Secondary | ICD-10-CM

## 2014-01-21 DIAGNOSIS — C50311 Malignant neoplasm of lower-inner quadrant of right female breast: Secondary | ICD-10-CM

## 2014-01-21 DIAGNOSIS — Z803 Family history of malignant neoplasm of breast: Secondary | ICD-10-CM

## 2014-01-21 DIAGNOSIS — Z171 Estrogen receptor negative status [ER-]: Secondary | ICD-10-CM

## 2014-01-21 DIAGNOSIS — Z5111 Encounter for antineoplastic chemotherapy: Secondary | ICD-10-CM

## 2014-01-21 LAB — COMPREHENSIVE METABOLIC PANEL (CC13)
ALT: 24 U/L (ref 0–55)
AST: 14 U/L (ref 5–34)
Albumin: 3.7 g/dL (ref 3.5–5.0)
Alkaline Phosphatase: 43 U/L (ref 40–150)
Anion Gap: 8 mEq/L (ref 3–11)
BILIRUBIN TOTAL: 0.46 mg/dL (ref 0.20–1.20)
BUN: 17.6 mg/dL (ref 7.0–26.0)
CO2: 28 mEq/L (ref 22–29)
CREATININE: 0.8 mg/dL (ref 0.6–1.1)
Calcium: 9.1 mg/dL (ref 8.4–10.4)
Chloride: 105 mEq/L (ref 98–109)
Glucose: 112 mg/dl (ref 70–140)
Potassium: 4.2 mEq/L (ref 3.5–5.1)
Sodium: 141 mEq/L (ref 136–145)
Total Protein: 6 g/dL — ABNORMAL LOW (ref 6.4–8.3)

## 2014-01-21 LAB — CBC WITH DIFFERENTIAL/PLATELET
BASO%: 0.2 % (ref 0.0–2.0)
Basophils Absolute: 0 10*3/uL (ref 0.0–0.1)
EOS%: 1.7 % (ref 0.0–7.0)
Eosinophils Absolute: 0.1 10*3/uL (ref 0.0–0.5)
HEMATOCRIT: 31.5 % — AB (ref 34.8–46.6)
HGB: 10.4 g/dL — ABNORMAL LOW (ref 11.6–15.9)
LYMPH%: 12.6 % — AB (ref 14.0–49.7)
MCH: 31.1 pg (ref 25.1–34.0)
MCHC: 33 g/dL (ref 31.5–36.0)
MCV: 94.3 fL (ref 79.5–101.0)
MONO#: 0.3 10*3/uL (ref 0.1–0.9)
MONO%: 6.5 % (ref 0.0–14.0)
NEUT#: 3.8 10*3/uL (ref 1.5–6.5)
NEUT%: 79 % — AB (ref 38.4–76.8)
PLATELETS: 179 10*3/uL (ref 145–400)
RBC: 3.34 10*6/uL — AB (ref 3.70–5.45)
RDW: 16.6 % — ABNORMAL HIGH (ref 11.2–14.5)
WBC: 4.8 10*3/uL (ref 3.9–10.3)
lymph#: 0.6 10*3/uL — ABNORMAL LOW (ref 0.9–3.3)
nRBC: 0 % (ref 0–0)

## 2014-01-21 MED ORDER — SODIUM CHLORIDE 0.9 % IV SOLN
300.0000 mg | Freq: Once | INTRAVENOUS | Status: AC
  Administered 2014-01-21: 300 mg via INTRAVENOUS
  Filled 2014-01-21: qty 30

## 2014-01-21 MED ORDER — SODIUM CHLORIDE 0.9 % IV SOLN
Freq: Once | INTRAVENOUS | Status: AC
  Administered 2014-01-21: 15:00:00 via INTRAVENOUS

## 2014-01-21 MED ORDER — DEXAMETHASONE SODIUM PHOSPHATE 20 MG/5ML IJ SOLN
INTRAMUSCULAR | Status: AC
  Filled 2014-01-21: qty 5

## 2014-01-21 MED ORDER — FAMOTIDINE IN NACL 20-0.9 MG/50ML-% IV SOLN
20.0000 mg | Freq: Once | INTRAVENOUS | Status: AC
  Administered 2014-01-21: 20 mg via INTRAVENOUS

## 2014-01-21 MED ORDER — ONDANSETRON 16 MG/50ML IVPB (CHCC)
INTRAVENOUS | Status: AC
  Filled 2014-01-21: qty 16

## 2014-01-21 MED ORDER — SODIUM CHLORIDE 0.9 % IV SOLN
80.0000 mg/m2 | Freq: Once | INTRAVENOUS | Status: AC
  Administered 2014-01-21: 162 mg via INTRAVENOUS
  Filled 2014-01-21: qty 27

## 2014-01-21 MED ORDER — FAMOTIDINE IN NACL 20-0.9 MG/50ML-% IV SOLN
INTRAVENOUS | Status: AC
  Filled 2014-01-21: qty 50

## 2014-01-21 MED ORDER — SODIUM CHLORIDE 0.9 % IJ SOLN
10.0000 mL | INTRAMUSCULAR | Status: DC | PRN
  Administered 2014-01-21: 10 mL
  Filled 2014-01-21: qty 10

## 2014-01-21 MED ORDER — DIPHENHYDRAMINE HCL 50 MG/ML IJ SOLN
50.0000 mg | Freq: Once | INTRAMUSCULAR | Status: AC
  Administered 2014-01-21: 50 mg via INTRAVENOUS

## 2014-01-21 MED ORDER — DIPHENHYDRAMINE HCL 50 MG/ML IJ SOLN
INTRAMUSCULAR | Status: AC
  Filled 2014-01-21: qty 1

## 2014-01-21 MED ORDER — HEPARIN SOD (PORK) LOCK FLUSH 100 UNIT/ML IV SOLN
500.0000 [IU] | Freq: Once | INTRAVENOUS | Status: AC | PRN
  Administered 2014-01-21: 500 [IU]
  Filled 2014-01-21: qty 5

## 2014-01-21 MED ORDER — ONDANSETRON 16 MG/50ML IVPB (CHCC)
16.0000 mg | Freq: Once | INTRAVENOUS | Status: AC
  Administered 2014-01-21: 16 mg via INTRAVENOUS

## 2014-01-21 MED ORDER — DEXAMETHASONE SODIUM PHOSPHATE 20 MG/5ML IJ SOLN
20.0000 mg | Freq: Once | INTRAMUSCULAR | Status: AC
  Administered 2014-01-21: 20 mg via INTRAVENOUS

## 2014-01-21 NOTE — Patient Instructions (Signed)
Cancer Center Discharge Instructions for Patients Receiving Chemotherapy  Today you received the following chemotherapy agents Taxol/Carboplatin To help prevent nausea and vomiting after your treatment, we encourage you to take your nausea medication as prescribed.  If you develop nausea and vomiting that is not controlled by your nausea medication, call the clinic.   BELOW ARE SYMPTOMS THAT SHOULD BE REPORTED IMMEDIATELY:  *FEVER GREATER THAN 100.5 F  *CHILLS WITH OR WITHOUT FEVER  NAUSEA AND VOMITING THAT IS NOT CONTROLLED WITH YOUR NAUSEA MEDICATION  *UNUSUAL SHORTNESS OF BREATH  *UNUSUAL BRUISING OR BLEEDING  TENDERNESS IN MOUTH AND THROAT WITH OR WITHOUT PRESENCE OF ULCERS  *URINARY PROBLEMS  *BOWEL PROBLEMS  UNUSUAL RASH Items with * indicate a potential emergency and should be followed up as soon as possible.  Feel free to call the clinic you have any questions or concerns. The clinic phone number is [phone].    

## 2014-01-21 NOTE — Telephone Encounter (Signed)
Revealed negative genetic test results, but that she has a BARD1 VUS.

## 2014-01-21 NOTE — Progress Notes (Signed)
[facility]  Telephone:(336) [phone] Fax:(336) [phone]  OFFICE PROGRESS NOTE   ID: Monty Mccarrell Trulock   DOB: 1961/02/04  MR#: [id number]  HBZ#:[id number]   PCP: [name], PA-C SU: Fanny Skates, MD RAD ONC:  Kyung Rudd, MD   DIAGNOSES: BENTLIE WITHEM is a 53 y.o. 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   STAGE:  Breast cancer of lower-inner quadrant of right female breast   Primary site: Breast (Right)   Staging method: AJCC 7th Edition   Clinical: Stage IIA (T2, N0, cM0)   Summary: Stage IIA (T2, N0, cM0)   PRIOR THERAPY: #1 Right breast needle core biopsy performed on 09/30/2013 resulting in pathology that revealed an invasive mammary carcinoma likely ductal phenotype, intermediate grade, with a tumor that is estrogen receptor negative, progesterone receptor negative, HER-2/neu negative, with a Ki-67 80%.  #2 Bilateral breast MRI on 10/11/2013 showed within the right breast there was an irregularly marginated enhancing mass with central necrosis located in the upper-inner quadrant at the 2 o'clock position (the middle 1/3).  The mass measures 3.9 x 3.7 x 3.1 cm in size and was associated with clip artifact.  There were no additional areas of worrisome enhancement within the right breast.  The left breast did not have mass or abnormal enhancement.  No abnormal appearing lymph nodes (clinical stage IIA T2 N0).  #3 Patient was recommended neoadjuvant chemotherapy utilizing Adriamycin/Cytoxan x 4 cycles with neulasta support followed by Taxol/Carbo weekly x 12 weeks.      #4 Genetic testing on 01/06/14, results pending.    CURRENT THERAPY: Taxol/Carbo week four  INTERVAL HISTORY: Mayari Matus Eckhart was seen today for evaluation prior to her fourth cycle of carbo taxol.  She is doing well today.  She has mild numbness in the toes of her  left foot that is intermittent.  She is taking Super B complex daily and tolerating it well.  She denies fevers, chills, nausea, vomiting, constipation, diarrhea, headaches, mucositis, skin changes, nail changes, or any other concerns.    PAST MEDICAL HISTORY: Past Medical History  Diagnosis Date  . Asthma 20's    allergic asthema  . Seasonal allergies   . PONV (postoperative nausea and vomiting)   . Breast cancer dx'd 10/01/2013    right; triplen negative    PAST SURGICAL HISTORY: Past Surgical History  Procedure Laterality Date  . Laparoscopic endometriosis fulguration  1991  . Abdominal hysterectomy  1992  . Portacath placement N/A 10/18/2013    Procedure: INSERTION PORT-A-CATH WITH ULTRASOUND;  Surgeon: Adin Hector, MD;  Location: [facility];  Service: General;  Laterality: N/A;    FAMILY HISTORY: Family History  Problem Relation Age of Onset  . Lung cancer Maternal Uncle     smoker  . Breast cancer Paternal Aunt     dx in her late 19s; maternal half sister to patient's father  . Breast cancer Maternal Grandmother 85  . Lung cancer Maternal Uncle     smoker  . Hypertension Father   . Heart disease Father   . Parkinsonism Father   . Skin cancer Father   . Hypercholesterolemia Mother   . Gallstones Mother   . Healthy Brother   . COPD Maternal Grandfather   . Lung cancer Cousin     smoker; paternal cousin    SOCIAL HISTORY: History  Substance Use Topics  . Smoking  status: Never Smoker   . Smokeless tobacco: Never Used  . Alcohol Use: No     Comment: once a month    ALLERGIES: Allergies  Allergen Reactions  . Penicillins Hives    CURRENT MEDICATIONS: Current Outpatient Prescriptions  Medication Sig Dispense Refill  . B Complex-C (SUPER B COMPLEX PO) Take 1 tablet by mouth.      . hydrocortisone (ANUSOL-HC) 2.5 % rectal cream Place 1 application rectally 2 (two) times daily.  30 g  0  . ibuprofen (ADVIL,MOTRIN) 200 MG tablet Take 200 mg by mouth every 6  (six) hours as needed for pain.      Marland Kitchen lidocaine-prilocaine (EMLA) cream Apply topically as needed. To port-a-cath 1.5 hours before chemotherapy and cover  30 g  1  . LORazepam (ATIVAN) 0.5 MG tablet Take 1 tablet (0.5 mg total) by mouth every 6 (six) hours as needed (Nausea or vomiting).  30 tablet  0  . polyethylene glycol (MIRALAX / GLYCOLAX) packet Take 17 g by mouth daily as needed.      . SENNA PO Take by mouth.      . dexamethasone (DECADRON) 4 MG tablet Take 2 tablets (8 mg total) by mouth 2 (two) times daily with a meal. Take two times a day starting the day after chemotherapy for 3 days.  30 tablet  1  . ondansetron (ZOFRAN) 8 MG tablet Take 1 tablet (8 mg total) by mouth 2 (two) times daily. Take two times a day starting the day after chemo for 3 days. Then take two times a day as needed for nausea or vomiting.  30 tablet  1  . prochlorperazine (COMPAZINE) 10 MG tablet Take 1 tablet (10 mg total) by mouth every 6 (six) hours as needed (Nausea or vomiting).  30 tablet  1  . UNABLE TO FIND 1 each by Other route as needed. Cranial Prosthesis       No current facility-administered medications for this visit.   REVIEW OF SYSTEMS:  A 10 point review of systems was conducted and is otherwise negative except for what is noted above.    PHYSICAL EXAMINATION: Blood pressure 140/80, pulse 114, temperature 98.6 F (37 C), temperature source Oral, resp. rate 20, height '5\' 6"'  (1.676 m), weight 202 lb 9.6 oz (91.899 kg). GENERAL: Patient is a well appearing female in no acute distress HEENT:  Sclerae anicteric.  Oropharynx clear and moist. No ulcerations or evidence of oropharyngeal candidiasis. Neck is supple.  NODES:  No cervical, supraclavicular, or axillary lymphadenopathy palpated.  BREAST EXAM:  Small mass palpated in the upper inner quadrant of the right breast LUNGS:  Clear to auscultation bilaterally.  No wheezes or rhonchi. HEART:  Regular rate and rhythm. No murmur  appreciated. ABDOMEN:  Soft, nontender.  Positive, normoactive bowel sounds. No organomegaly palpated. MSK:  No focal spinal tenderness to palpation. Full range of motion bilaterally in the upper extremities. EXTREMITIES:  No peripheral edema.   SKIN:  Clear with no obvious rashes or skin changes. No nail dyscrasia. NEURO:  Nonfocal. Well oriented.  Appropriate affect. ECOG FS: 1 - Symptomatic but completely ambulatory  LABORATORIES:   Chemistry      Component Value Date/Time   NA 141 01/21/2014 1322   NA 140 10/14/2013 0930   K 4.2 01/21/2014 1322   K 4.7 10/14/2013 0930   CL 105 10/14/2013 0930   CO2 28 01/21/2014 1322   CO2 29 10/14/2013 0930   BUN 17.6 01/21/2014 1322  BUN 12 10/14/2013 0930   CREATININE 0.8 01/21/2014 1322   CREATININE 0.71 10/14/2013 0930      Component Value Date/Time   CALCIUM 9.1 01/21/2014 1322   CALCIUM 9.6 10/14/2013 0930   ALKPHOS 43 01/21/2014 1322   ALKPHOS 70 10/14/2013 0930   AST 14 01/21/2014 1322   AST 16 10/14/2013 0930   ALT 24 01/21/2014 1322   ALT 18 10/14/2013 0930   BILITOT 0.46 01/21/2014 1322   BILITOT 0.3 10/14/2013 0930      Lab Results  Component Value Date   WBC 4.8 01/21/2014   HGB 10.4* 01/21/2014   HCT 31.5* 01/21/2014   MCV 94.3 01/21/2014   PLT 179 01/21/2014    STUDIES/IMAGING: 1.  2-D echocardiogram on 10/11/2013 showed a left ventricular ejection fraction of 60% - 65%.  ASSESSMENT: EARLEAN FIDALGO is a 53 y.o. female diagnosed with triple negative invasive ductal carcinoma of the right breast in 09/2013:  #1 Intermediate grade invasive ductal carcinoma of the right breast. The mass measures 3.5 cm by ultrasound. Prognostic markers revealed the tumor to be estrogen receptor negative, progesterone receptor negative, HER-2/neu negative, with an elevated proliferation marker Ki-67 of 80%.   #2  Bilateral breast MRI on 10/11/2013 showed within the right breast there was an irregularly marginated enhancing mass with central  necrosis located in the upper-inner quadrant at the 2 o'clock position (the middle 1/3).  The mass measures 3.9 x 3.7 x 3.1 cm in size and was associated with clip artifact.  There were no additional areas of worrisome enhancement within the right breast.  The left breast did not have mass or abnormal enhancement.  No abnormal appearing lymph nodes (clinical stage IIA T2 N0).    #3  Patient started neoadjuvant chemotherapy of dose dense Adriamycin/Cytoxan with 4 planned doses on 10/27/2013.  Day 2 granulocyte support with [name] injection.  This was followed by neoadjuvant chemotherapy consisting of Taxol/carbo weekly x 12 weeks which started on 12/29/14.  #4  If patient does successfully undergo lumpectomy she will need radiation therapy.  #5 Patient has a family history of breast cancer and herself has triple negative breast cancer and underwent genetic counseling and testing on 01/06/14, results are pending.   PLAN:   #1 Mrs. Golomb is doing well today.  Her CBC is stable.  I reviewed this with her in detail.  She will proceed with chemotherapy.    #2 She received her medical records today for her travel.     #3  Mrs. Ohaver will return next week for labs, evaluation, and week five of Taxol Carbo.     #4 We discussed the numbness in her left toes.  She will continue to monitor this.  I discussed gabapentin with the patient and gave her detailed information in her AVS, as we will likely have to prescribe this in the future.      All questions answered.  Mrs. Cregg was encouraged to contact us in the interim with any questions, concerns, or problems.  I spent 25 minutes counseling the patient face to face.  The total time spent in the appointment was 30 minutes.  Minette Headland, [place] [phone] 01/21/2014  2:23 PM

## 2014-01-21 NOTE — Patient Instructions (Signed)
Gabapentin capsules or tablets What is this medicine? GABAPENTIN (GA ba pen tin) is used to control partial seizures in adults with epilepsy. It is also used to treat certain types of nerve pain. This medicine may be used for other purposes; ask your health care provider or pharmacist if you have questions. COMMON BRAND NAME(S): Gabarone , Neurontin What should I tell my health care provider before I take this medicine? They need to know if you have any of these conditions: -kidney disease -suicidal thoughts, plans, or attempt; a previous suicide attempt by you or a family member -an unusual or allergic reaction to gabapentin, other medicines, foods, dyes, or preservatives -pregnant or trying to get pregnant -breast-feeding How should I use this medicine? Take this medicine by mouth with a glass of water. Follow the directions on the prescription label. You can take it with or without food. If it upsets your stomach, take it with food.Take your medicine at regular intervals. Do not take it more often than directed. Do not stop taking except on your doctor's advice. If you are directed to break the 600 or 800 mg tablets in half as part of your dose, the extra half tablet should be used for the next dose. If you have not used the extra half tablet within 28 days, it should be thrown away. A special MedGuide will be given to you by the pharmacist with each prescription and refill. Be sure to read this information carefully each time. Talk to your pediatrician regarding the use of this medicine in children. Special care may be needed. Overdosage: If you think you have taken too much of this medicine contact a poison control center or emergency room at once. NOTE: This medicine is only for you. Do not share this medicine with others. What if I miss a dose? If you miss a dose, take it as soon as you can. If it is almost time for your next dose, take only that dose. Do not take double or extra  doses. What may interact with this medicine? Do not take this medicine with any of the following medications: -other gabapentin products This medicine may also interact with the following medications: -alcohol -antacids -antihistamines for allergy, cough and cold -certain medicines for anxiety or sleep -certain medicines for depression or psychotic disturbances -homatropine; hydrocodone -naproxen -narcotic medicines (opiates) for pain -phenothiazines like chlorpromazine, mesoridazine, prochlorperazine, thioridazine This list may not describe all possible interactions. Give your health care provider a list of all the medicines, herbs, non-prescription drugs, or dietary supplements you use. Also tell them if you smoke, drink alcohol, or use illegal drugs. Some items may interact with your medicine. What should I watch for while using this medicine? Visit your doctor or health care professional for regular checks on your progress. You may want to keep a record at home of how you feel your condition is responding to treatment. You may want to share this information with your doctor or health care professional at each visit. You should contact your doctor or health care professional if your seizures get worse or if you have any new types of seizures. Do not stop taking this medicine or any of your seizure medicines unless instructed by your doctor or health care professional. Stopping your medicine suddenly can increase your seizures or their severity. Wear a medical identification bracelet or chain if you are taking this medicine for seizures, and carry a card that lists all your medications. You may get drowsy, dizzy, or have   blurred vision. Do not drive, use machinery, or do anything that needs mental alertness until you know how this medicine affects you. To reduce dizzy or fainting spells, do not sit or stand up quickly, especially if you are an older patient. Alcohol can increase drowsiness and  dizziness. Avoid alcoholic drinks. Your mouth may get dry. Chewing sugarless gum or sucking hard candy, and drinking plenty of water will help. The use of this medicine may increase the chance of suicidal thoughts or actions. Pay special attention to how you are responding while on this medicine. Any worsening of mood, or thoughts of suicide or dying should be reported to your health care professional right away. Women who become pregnant while using this medicine may enroll in the [place] Antiepileptic Drug Pregnancy Registry by calling 1-888-233-2334. This registry collects information about the safety of antiepileptic drug use during pregnancy. What side effects may I notice from receiving this medicine? Side effects that you should report to your doctor or health care professional as soon as possible: -allergic reactions like skin rash, itching or hives, swelling of the face, lips, or tongue -worsening of mood, thoughts or actions of suicide or dying Side effects that usually do not require medical attention (report to your doctor or health care professional if they continue or are bothersome): -constipation -difficulty walking or controlling muscle movements -dizziness -nausea -slurred speech -tiredness -tremors -weight gain This list may not describe all possible side effects. Call your doctor for medical advice about side effects. You may report side effects to FDA at 1-800-FDA-1088. Where should I keep my medicine? Keep out of reach of children. Store at room temperature between 15 and 30 degrees C (59 and 86 degrees F). Throw away any unused medicine after the expiration date. NOTE: This sheet is a summary. It may not cover all possible information. If you have questions about this medicine, talk to your doctor, pharmacist, or health care provider.  2014, [employer]vier/Gold Standard. (2013-08-19 09:12:48)  

## 2014-01-28 ENCOUNTER — Ambulatory Visit (HOSPITAL_BASED_OUTPATIENT_CLINIC_OR_DEPARTMENT_OTHER): Payer: BC Managed Care – PPO

## 2014-01-28 ENCOUNTER — Encounter: Payer: Self-pay | Admitting: Adult Health

## 2014-01-28 ENCOUNTER — Encounter: Payer: Self-pay | Admitting: Oncology

## 2014-01-28 ENCOUNTER — Ambulatory Visit (HOSPITAL_BASED_OUTPATIENT_CLINIC_OR_DEPARTMENT_OTHER): Payer: BC Managed Care – PPO | Admitting: Adult Health

## 2014-01-28 ENCOUNTER — Other Ambulatory Visit (HOSPITAL_BASED_OUTPATIENT_CLINIC_OR_DEPARTMENT_OTHER): Payer: BC Managed Care – PPO

## 2014-01-28 VITALS — BP 117/82 | HR 99 | Temp 98.8°F | Resp 18 | Ht 66.0 in | Wt 204.9 lb

## 2014-01-28 DIAGNOSIS — C50311 Malignant neoplasm of lower-inner quadrant of right female breast: Secondary | ICD-10-CM

## 2014-01-28 DIAGNOSIS — C50219 Malignant neoplasm of upper-inner quadrant of unspecified female breast: Secondary | ICD-10-CM

## 2014-01-28 DIAGNOSIS — Z5111 Encounter for antineoplastic chemotherapy: Secondary | ICD-10-CM

## 2014-01-28 DIAGNOSIS — G629 Polyneuropathy, unspecified: Secondary | ICD-10-CM

## 2014-01-28 DIAGNOSIS — G589 Mononeuropathy, unspecified: Secondary | ICD-10-CM

## 2014-01-28 DIAGNOSIS — Z171 Estrogen receptor negative status [ER-]: Secondary | ICD-10-CM

## 2014-01-28 LAB — CBC WITH DIFFERENTIAL/PLATELET
BASO%: 0.6 % (ref 0.0–2.0)
Basophils Absolute: 0 10*3/uL (ref 0.0–0.1)
EOS%: 2.5 % (ref 0.0–7.0)
Eosinophils Absolute: 0.1 10*3/uL (ref 0.0–0.5)
HEMATOCRIT: 30.6 % — AB (ref 34.8–46.6)
HEMOGLOBIN: 10.6 g/dL — AB (ref 11.6–15.9)
LYMPH%: 19.7 % (ref 14.0–49.7)
MCH: 33.1 pg (ref 25.1–34.0)
MCHC: 34.7 g/dL (ref 31.5–36.0)
MCV: 95.6 fL (ref 79.5–101.0)
MONO#: 0.2 10*3/uL (ref 0.1–0.9)
MONO%: 7.8 % (ref 0.0–14.0)
NEUT#: 2.2 10*3/uL (ref 1.5–6.5)
NEUT%: 69.4 % (ref 38.4–76.8)
PLATELETS: 188 10*3/uL (ref 145–400)
RBC: 3.21 10*6/uL — ABNORMAL LOW (ref 3.70–5.45)
RDW: 17.6 % — ABNORMAL HIGH (ref 11.2–14.5)
WBC: 3.2 10*3/uL — ABNORMAL LOW (ref 3.9–10.3)
lymph#: 0.6 10*3/uL — ABNORMAL LOW (ref 0.9–3.3)

## 2014-01-28 LAB — COMPREHENSIVE METABOLIC PANEL (CC13)
ALT: 32 U/L (ref 0–55)
ANION GAP: 10 meq/L (ref 3–11)
AST: 19 U/L (ref 5–34)
Albumin: 3.7 g/dL (ref 3.5–5.0)
Alkaline Phosphatase: 51 U/L (ref 40–150)
BUN: 10.8 mg/dL (ref 7.0–26.0)
CALCIUM: 8.9 mg/dL (ref 8.4–10.4)
CHLORIDE: 102 meq/L (ref 98–109)
CO2: 27 mEq/L (ref 22–29)
CREATININE: 0.7 mg/dL (ref 0.6–1.1)
Glucose: 114 mg/dl (ref 70–140)
Potassium: 3.9 mEq/L (ref 3.5–5.1)
Sodium: 140 mEq/L (ref 136–145)
Total Bilirubin: 0.48 mg/dL (ref 0.20–1.20)
Total Protein: 6 g/dL — ABNORMAL LOW (ref 6.4–8.3)

## 2014-01-28 MED ORDER — FAMOTIDINE IN NACL 20-0.9 MG/50ML-% IV SOLN
INTRAVENOUS | Status: AC
  Filled 2014-01-28: qty 50

## 2014-01-28 MED ORDER — SODIUM CHLORIDE 0.9 % IV SOLN
300.0000 mg | Freq: Once | INTRAVENOUS | Status: AC
  Administered 2014-01-28: 300 mg via INTRAVENOUS
  Filled 2014-01-28: qty 30

## 2014-01-28 MED ORDER — DIPHENHYDRAMINE HCL 50 MG/ML IJ SOLN
INTRAMUSCULAR | Status: AC
  Filled 2014-01-28: qty 1

## 2014-01-28 MED ORDER — SODIUM CHLORIDE 0.9 % IV SOLN
Freq: Once | INTRAVENOUS | Status: AC
  Administered 2014-01-28: 15:00:00 via INTRAVENOUS

## 2014-01-28 MED ORDER — FAMOTIDINE IN NACL 20-0.9 MG/50ML-% IV SOLN
20.0000 mg | Freq: Once | INTRAVENOUS | Status: AC
  Administered 2014-01-28: 20 mg via INTRAVENOUS

## 2014-01-28 MED ORDER — DEXAMETHASONE SODIUM PHOSPHATE 20 MG/5ML IJ SOLN
20.0000 mg | Freq: Once | INTRAMUSCULAR | Status: AC
  Administered 2014-01-28: 20 mg via INTRAVENOUS

## 2014-01-28 MED ORDER — ONDANSETRON 16 MG/50ML IVPB (CHCC)
INTRAVENOUS | Status: AC
  Filled 2014-01-28: qty 16

## 2014-01-28 MED ORDER — SODIUM CHLORIDE 0.9 % IV SOLN
80.0000 mg/m2 | Freq: Once | INTRAVENOUS | Status: AC
  Administered 2014-01-28: 162 mg via INTRAVENOUS
  Filled 2014-01-28: qty 27

## 2014-01-28 MED ORDER — HEPARIN SOD (PORK) LOCK FLUSH 100 UNIT/ML IV SOLN
500.0000 [IU] | Freq: Once | INTRAVENOUS | Status: AC | PRN
  Administered 2014-01-28: 500 [IU]
  Filled 2014-01-28: qty 5

## 2014-01-28 MED ORDER — ONDANSETRON 16 MG/50ML IVPB (CHCC)
16.0000 mg | Freq: Once | INTRAVENOUS | Status: AC
  Administered 2014-01-28: 16 mg via INTRAVENOUS

## 2014-01-28 MED ORDER — DEXAMETHASONE SODIUM PHOSPHATE 20 MG/5ML IJ SOLN
INTRAMUSCULAR | Status: AC
  Filled 2014-01-28: qty 5

## 2014-01-28 MED ORDER — DIPHENHYDRAMINE HCL 50 MG/ML IJ SOLN
50.0000 mg | Freq: Once | INTRAMUSCULAR | Status: AC
  Administered 2014-01-28: 50 mg via INTRAVENOUS

## 2014-01-28 MED ORDER — SODIUM CHLORIDE 0.9 % IJ SOLN
10.0000 mL | INTRAMUSCULAR | Status: DC | PRN
  Administered 2014-01-28: 10 mL
  Filled 2014-01-28: qty 10

## 2014-01-28 MED ORDER — GABAPENTIN 100 MG PO CAPS: 100.0000 mg | ORAL_CAPSULE | Freq: Three times a day (TID) | ORAL | Status: DC

## 2014-01-28 NOTE — Patient Instructions (Signed)
[facility] Discharge Instructions for Patients Receiving Chemotherapy  Today you received the following chemotherapy agents TAXOL, CARBOPLATIN  To help prevent nausea and vomiting after your treatment, we encourage you to take your nausea medication START TAKING ABOUT 9 PM IF NEEDED TONIGHT   If you develop nausea and vomiting that is not controlled by your nausea medication, call the clinic.   BELOW ARE SYMPTOMS THAT SHOULD BE REPORTED IMMEDIATELY:  *FEVER GREATER THAN 100.5 F  *CHILLS WITH OR WITHOUT FEVER  NAUSEA AND VOMITING THAT IS NOT CONTROLLED WITH YOUR NAUSEA MEDICATION  *UNUSUAL SHORTNESS OF BREATH  *UNUSUAL BRUISING OR BLEEDING  TENDERNESS IN MOUTH AND THROAT WITH OR WITHOUT PRESENCE OF ULCERS  *URINARY PROBLEMS  *BOWEL PROBLEMS  UNUSUAL RASH Items with * indicate a potential emergency and should be followed up as soon as possible.  Feel free to call the clinic you have any questions or concerns. The clinic phone number is [phone].

## 2014-01-28 NOTE — Progress Notes (Signed)
[place]  [phone] [phone]  OFFICE PROGRESS NOTE   ID: Natalie Arias   DOB: 03/28/1961  MR#: [id number]  KWI#:[id number]   PCP: [name], PA-C SU: Fanny Skates, MD RAD ONC:  Kyung Rudd, MD   DIAGNOSES: Natalie Arias is a 53 y.o. 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   STAGE:  Breast cancer of lower-inner quadrant of right female breast   Primary site: Breast (Right)   Staging method: AJCC 7th Edition   Clinical: Stage IIA (T2, N0, cM0)   Summary: Stage IIA (T2, N0, cM0)   PRIOR THERAPY: #1 Right breast needle core biopsy performed on 09/30/2013 resulting in pathology that revealed an invasive mammary carcinoma likely ductal phenotype, intermediate grade, with a tumor that is estrogen receptor negative, progesterone receptor negative, HER-2/neu negative, with a Ki-67 80%.  #2 Bilateral breast MRI on 10/11/2013 showed within the right breast there was an irregularly marginated enhancing mass with central necrosis located in the upper-inner quadrant at the 2 o'clock position (the middle 1/3).  The mass measures 3.9 x 3.7 x 3.1 cm in size and was associated with clip artifact.  There were no additional areas of worrisome enhancement within the right breast.  The left breast did not have mass or abnormal enhancement.  No abnormal appearing lymph nodes (clinical stage IIA T2 N0).  #3 Patient was recommended neoadjuvant chemotherapy utilizing Adriamycin/Cytoxan x 4 cycles with neulasta support followed by Taxol/Carbo weekly x 12 weeks.      #4 Genetic testing on 01/06/14, results pending.    CURRENT THERAPY: Taxol/Carbo week five  INTERVAL HISTORY: Natalie Arias was seen today for evaluation prior to her fifth cycle of Taxol Carbo.  She is doing well today.   She continues to take Super B complex daily  and her numbness is slightly worse.  She is planning on going to [place] next week.  She denies fevers, chills, nausea, vomiting, constipation, diarrhea, numbness, or any other concerns.    PAST MEDICAL HISTORY: Past Medical History  Diagnosis Date  . Asthma 20's    allergic asthema  . Seasonal allergies   . PONV (postoperative nausea and vomiting)   . Breast cancer dx'd 10/01/2013    right; triplen negative    PAST SURGICAL HISTORY: Past Surgical History  Procedure Laterality Date  . Laparoscopic endometriosis fulguration  1991  . Abdominal hysterectomy  1992  . Portacath placement N/A 10/18/2013    Procedure: INSERTION PORT-A-CATH WITH ULTRASOUND;  Surgeon: Adin Hector, MD;  Location: [facility];  Service: General;  Laterality: N/A;    FAMILY HISTORY: Family History  Problem Relation Age of Onset  . Lung cancer Maternal Uncle     smoker  . Breast cancer Paternal Aunt     dx in her late 27s; maternal half sister to patient's father  . Breast cancer Maternal Grandmother 98  . Lung cancer Maternal Uncle     smoker  . Hypertension Father   . Heart disease Father   . Parkinsonism Father   . Skin cancer Father   . Hypercholesterolemia Mother   . Gallstones Mother   . Healthy Brother   . COPD Maternal Grandfather   . Lung cancer Cousin     smoker; paternal cousin    SOCIAL HISTORY: History  Substance Use Topics  . Smoking status: Never Smoker   .  Smokeless tobacco: Never Used  . Alcohol Use: No     Comment: once a month    ALLERGIES: Allergies  Allergen Reactions  . Penicillins Hives    CURRENT MEDICATIONS: Current Outpatient Prescriptions  Medication Sig Dispense Refill  . B Complex-C (SUPER B COMPLEX PO) Take 1 tablet by mouth.      . hydrocortisone (ANUSOL-HC) 2.5 % rectal cream Place 1 application rectally 2 (two) times daily.  30 g  0  . ibuprofen (ADVIL,MOTRIN) 200 MG tablet Take 200 mg by mouth every 6 (six) hours as needed for pain.      Marland Kitchen  lidocaine-prilocaine (EMLA) cream Apply topically as needed. To port-a-cath 1.5 hours before chemotherapy and cover  30 g  1  . LORazepam (ATIVAN) 0.5 MG tablet Take 1 tablet (0.5 mg total) by mouth every 6 (six) hours as needed (Nausea or vomiting).  30 tablet  0  . SENNA PO Take by mouth.      . dexamethasone (DECADRON) 4 MG tablet Take 2 tablets (8 mg total) by mouth 2 (two) times daily with a meal. Take two times a day starting the day after chemotherapy for 3 days.  30 tablet  1  . gabapentin (NEURONTIN) 100 MG capsule Take 1 capsule (100 mg total) by mouth 3 (three) times daily.  90 capsule  1  . ondansetron (ZOFRAN) 8 MG tablet Take 1 tablet (8 mg total) by mouth 2 (two) times daily. Take two times a day starting the day after chemo for 3 days. Then take two times a day as needed for nausea or vomiting.  30 tablet  1  . polyethylene glycol (MIRALAX / GLYCOLAX) packet Take 17 g by mouth daily as needed.      . prochlorperazine (COMPAZINE) 10 MG tablet Take 1 tablet (10 mg total) by mouth every 6 (six) hours as needed (Nausea or vomiting).  30 tablet  1  . UNABLE TO FIND 1 each by Other route as needed. Cranial Prosthesis       No current facility-administered medications for this visit.   REVIEW OF SYSTEMS:  A 10 point review of systems was conducted and is otherwise negative except for what is noted above.    PHYSICAL EXAMINATION: Blood pressure 117/82, pulse 99, temperature 98.8 F (37.1 C), temperature source Oral, resp. rate 18, height '5\' 6"'  (1.676 m), weight 204 lb 14.4 oz (92.942 kg). GENERAL: Patient is a well appearing female in no acute distress HEENT:  Sclerae anicteric.  Oropharynx clear and moist. No ulcerations or evidence of oropharyngeal candidiasis. Neck is supple.  NODES:  No cervical, supraclavicular, or axillary lymphadenopathy palpated.  BREAST EXAM:  Small mass palpated in the upper inner quadrant of the right breast LUNGS:  Clear to auscultation bilaterally.  No  wheezes or rhonchi. HEART:  Regular rate and rhythm. No murmur appreciated. ABDOMEN:  Soft, nontender.  Positive, normoactive bowel sounds. No organomegaly palpated. MSK:  No focal spinal tenderness to palpation. Full range of motion bilaterally in the upper extremities. EXTREMITIES:  No peripheral edema.   SKIN:  Clear with no obvious rashes or skin changes. No nail dyscrasia. NEURO:  Nonfocal. Well oriented.  Appropriate affect. ECOG FS: 1 - Symptomatic but completely ambulatory  LABORATORIES:   Chemistry      Component Value Date/Time   NA 140 01/28/2014 1311   NA 140 10/14/2013 0930   K 3.9 01/28/2014 1311   K 4.7 10/14/2013 0930   CL 105 10/14/2013 0930  CO2 27 01/28/2014 1311   CO2 29 10/14/2013 0930   BUN 10.8 01/28/2014 1311   BUN 12 10/14/2013 0930   CREATININE 0.7 01/28/2014 1311   CREATININE 0.71 10/14/2013 0930      Component Value Date/Time   CALCIUM 8.9 01/28/2014 1311   CALCIUM 9.6 10/14/2013 0930   ALKPHOS 51 01/28/2014 1311   ALKPHOS 70 10/14/2013 0930   AST 19 01/28/2014 1311   AST 16 10/14/2013 0930   ALT 32 01/28/2014 1311   ALT 18 10/14/2013 0930   BILITOT 0.48 01/28/2014 1311   BILITOT 0.3 10/14/2013 0930      Lab Results  Component Value Date   WBC 3.2* 01/28/2014   HGB 10.6* 01/28/2014   HCT 30.6* 01/28/2014   MCV 95.6 01/28/2014   PLT 188 01/28/2014    STUDIES/IMAGING: 1.  2-D echocardiogram on 10/11/2013 showed a left ventricular ejection fraction of 60% - 65%.  ASSESSMENT: Natalie Arias is a 53 y.o. female diagnosed with triple negative invasive ductal carcinoma of the right breast in 09/2013:  #1 Intermediate grade invasive ductal carcinoma of the right breast. The mass measures 3.5 cm by ultrasound. Prognostic markers revealed the tumor to be estrogen receptor negative, progesterone receptor negative, HER-2/neu negative, with an elevated proliferation marker Ki-67 of 80%.   #2  Bilateral breast MRI on 10/11/2013 showed within the right breast  there was an irregularly marginated enhancing mass with central necrosis located in the upper-inner quadrant at the 2 o'clock position (the middle 1/3).  The mass measures 3.9 x 3.7 x 3.1 cm in size and was associated with clip artifact.  There were no additional areas of worrisome enhancement within the right breast.  The left breast did not have mass or abnormal enhancement.  No abnormal appearing lymph nodes (clinical stage IIA T2 N0).    #3  Patient started neoadjuvant chemotherapy of dose dense Adriamycin/Cytoxan with 4 planned doses on 10/27/2013.  Day 2 granulocyte support with [name] injection.  This was followed by neoadjuvant chemotherapy consisting of Taxol/carbo weekly x 12 weeks which started on 12/29/14.  #4  If patient does successfully undergo lumpectomy she will need radiation therapy.  #5 Patient has a family history of breast cancer and herself has triple negative breast cancer and underwent genetic counseling and testing on 01/06/14, results are pending.   PLAN:   #1 Mrs. Vanderhoef is doing well today.  Her CBC is stable.  I reviewed this with her in detail.  She will proceed with cycle 5 of 12 of weekly Taxol/Carbo.  A CMP is pending.  #2 Mrs. Ostroff will return next week for labs, evaluation, and week six of Taxol Carbo.     #3 She will start taking gabapenin 161m TID for her neuropathy.  She has read detailed information about this and is ready to proceed with this medication starting today.    All questions answered.  Mrs. Folkes was encouraged to contact [facility] the interim with any questions, concerns, or problems.  I spent 25 minutes counseling the patient face to face.  The total time spent in the appointment was 30 minutes.  LMinette Headland [phone]/01/2014  3:44 PM

## 2014-02-04 ENCOUNTER — Encounter: Payer: Self-pay | Admitting: Adult Health

## 2014-02-04 ENCOUNTER — Encounter: Payer: Self-pay | Admitting: Oncology

## 2014-02-04 ENCOUNTER — Ambulatory Visit (HOSPITAL_BASED_OUTPATIENT_CLINIC_OR_DEPARTMENT_OTHER): Payer: BC Managed Care – PPO

## 2014-02-04 ENCOUNTER — Other Ambulatory Visit (HOSPITAL_BASED_OUTPATIENT_CLINIC_OR_DEPARTMENT_OTHER): Payer: BC Managed Care – PPO

## 2014-02-04 ENCOUNTER — Ambulatory Visit (HOSPITAL_BASED_OUTPATIENT_CLINIC_OR_DEPARTMENT_OTHER): Payer: BC Managed Care – PPO | Admitting: Adult Health

## 2014-02-04 VITALS — BP 150/88 | HR 130 | Temp 98.2°F | Resp 18 | Ht 66.0 in | Wt 204.6 lb

## 2014-02-04 DIAGNOSIS — C50311 Malignant neoplasm of lower-inner quadrant of right female breast: Secondary | ICD-10-CM

## 2014-02-04 DIAGNOSIS — C50219 Malignant neoplasm of upper-inner quadrant of unspecified female breast: Secondary | ICD-10-CM

## 2014-02-04 DIAGNOSIS — Z5111 Encounter for antineoplastic chemotherapy: Secondary | ICD-10-CM

## 2014-02-04 DIAGNOSIS — G589 Mononeuropathy, unspecified: Secondary | ICD-10-CM

## 2014-02-04 DIAGNOSIS — Z171 Estrogen receptor negative status [ER-]: Secondary | ICD-10-CM

## 2014-02-04 LAB — COMPREHENSIVE METABOLIC PANEL (CC13)
ALT: 35 U/L (ref 0–55)
AST: 22 U/L (ref 5–34)
Albumin: 3.7 g/dL (ref 3.5–5.0)
Alkaline Phosphatase: 52 U/L (ref 40–150)
Anion Gap: 10 mEq/L (ref 3–11)
BILIRUBIN TOTAL: 0.56 mg/dL (ref 0.20–1.20)
BUN: 11.1 mg/dL (ref 7.0–26.0)
CO2: 27 mEq/L (ref 22–29)
CREATININE: 0.8 mg/dL (ref 0.6–1.1)
Calcium: 9 mg/dL (ref 8.4–10.4)
Chloride: 101 mEq/L (ref 98–109)
GLUCOSE: 149 mg/dL — AB (ref 70–140)
Potassium: 3.8 mEq/L (ref 3.5–5.1)
Sodium: 138 mEq/L (ref 136–145)
TOTAL PROTEIN: 6.1 g/dL — AB (ref 6.4–8.3)

## 2014-02-04 LAB — CBC WITH DIFFERENTIAL/PLATELET
BASO%: 1.4 % (ref 0.0–2.0)
Basophils Absolute: 0 10*3/uL (ref 0.0–0.1)
EOS ABS: 0 10*3/uL (ref 0.0–0.5)
EOS%: 1 % (ref 0.0–7.0)
HCT: 31.5 % — ABNORMAL LOW (ref 34.8–46.6)
HGB: 10.7 g/dL — ABNORMAL LOW (ref 11.6–15.9)
LYMPH%: 24.4 % (ref 14.0–49.7)
MCH: 32.2 pg (ref 25.1–34.0)
MCHC: 34 g/dL (ref 31.5–36.0)
MCV: 94.8 fL (ref 79.5–101.0)
MONO#: 0.3 10*3/uL (ref 0.1–0.9)
MONO%: 9.5 % (ref 0.0–14.0)
NEUT#: 2.1 10*3/uL (ref 1.5–6.5)
NEUT%: 63.7 % (ref 38.4–76.8)
PLATELETS: 211 10*3/uL (ref 145–400)
RBC: 3.33 10*6/uL — ABNORMAL LOW (ref 3.70–5.45)
RDW: 16.9 % — ABNORMAL HIGH (ref 11.2–14.5)
WBC: 3.3 10*3/uL — ABNORMAL LOW (ref 3.9–10.3)
lymph#: 0.8 10*3/uL — ABNORMAL LOW (ref 0.9–3.3)

## 2014-02-04 MED ORDER — DIPHENHYDRAMINE HCL 50 MG/ML IJ SOLN
INTRAMUSCULAR | Status: AC
  Filled 2014-02-04: qty 1

## 2014-02-04 MED ORDER — SODIUM CHLORIDE 0.9 % IJ SOLN
10.0000 mL | INTRAMUSCULAR | Status: DC | PRN
  Administered 2014-02-04: 10 mL
  Filled 2014-02-04: qty 10

## 2014-02-04 MED ORDER — ONDANSETRON 16 MG/50ML IVPB (CHCC)
INTRAVENOUS | Status: AC
  Filled 2014-02-04: qty 16

## 2014-02-04 MED ORDER — DEXAMETHASONE SODIUM PHOSPHATE 20 MG/5ML IJ SOLN
20.0000 mg | Freq: Once | INTRAMUSCULAR | Status: AC
  Administered 2014-02-04: 20 mg via INTRAVENOUS

## 2014-02-04 MED ORDER — SODIUM CHLORIDE 0.9 % IV SOLN
80.0000 mg/m2 | Freq: Once | INTRAVENOUS | Status: AC
  Administered 2014-02-04: 162 mg via INTRAVENOUS
  Filled 2014-02-04: qty 27

## 2014-02-04 MED ORDER — FAMOTIDINE IN NACL 20-0.9 MG/50ML-% IV SOLN
INTRAVENOUS | Status: AC
  Filled 2014-02-04: qty 50

## 2014-02-04 MED ORDER — FAMOTIDINE IN NACL 20-0.9 MG/50ML-% IV SOLN
20.0000 mg | Freq: Once | INTRAVENOUS | Status: AC
  Administered 2014-02-04: 20 mg via INTRAVENOUS

## 2014-02-04 MED ORDER — SODIUM CHLORIDE 0.9 % IV SOLN
Freq: Once | INTRAVENOUS | Status: AC
  Administered 2014-02-04: 15:00:00 via INTRAVENOUS

## 2014-02-04 MED ORDER — HEPARIN SOD (PORK) LOCK FLUSH 100 UNIT/ML IV SOLN
500.0000 [IU] | Freq: Once | INTRAVENOUS | Status: AC | PRN
  Administered 2014-02-04: 500 [IU]
  Filled 2014-02-04: qty 5

## 2014-02-04 MED ORDER — DEXAMETHASONE SODIUM PHOSPHATE 20 MG/5ML IJ SOLN
INTRAMUSCULAR | Status: AC
  Filled 2014-02-04: qty 5

## 2014-02-04 MED ORDER — ONDANSETRON 16 MG/50ML IVPB (CHCC)
16.0000 mg | Freq: Once | INTRAVENOUS | Status: AC
  Administered 2014-02-04: 16 mg via INTRAVENOUS

## 2014-02-04 MED ORDER — DIPHENHYDRAMINE HCL 50 MG/ML IJ SOLN
50.0000 mg | Freq: Once | INTRAMUSCULAR | Status: AC
  Administered 2014-02-04: 50 mg via INTRAVENOUS

## 2014-02-04 MED ORDER — SODIUM CHLORIDE 0.9 % IV SOLN
300.0000 mg | Freq: Once | INTRAVENOUS | Status: AC
  Administered 2014-02-04: 300 mg via INTRAVENOUS
  Filled 2014-02-04: qty 30

## 2014-02-04 NOTE — Patient Instructions (Signed)
[facility] Discharge Instructions for Patients Receiving Chemotherapy  Today you received the following chemotherapy agents TAXOL, CARBOPLATIN  To help prevent nausea and vomiting after your treatment, we encourage you to take your nausea medication START TAKING ABOUT 9 PM IF NEEDED TONIGHT   If you develop nausea and vomiting that is not controlled by your nausea medication, call the clinic.   BELOW ARE SYMPTOMS THAT SHOULD BE REPORTED IMMEDIATELY:  *FEVER GREATER THAN 100.5 F  *CHILLS WITH OR WITHOUT FEVER  NAUSEA AND VOMITING THAT IS NOT CONTROLLED WITH YOUR NAUSEA MEDICATION  *UNUSUAL SHORTNESS OF BREATH  *UNUSUAL BRUISING OR BLEEDING  TENDERNESS IN MOUTH AND THROAT WITH OR WITHOUT PRESENCE OF ULCERS  *URINARY PROBLEMS  *BOWEL PROBLEMS  UNUSUAL RASH Items with * indicate a potential emergency and should be followed up as soon as possible.  Feel free to call the clinic you have any questions or concerns. The clinic phone number is [phone].    

## 2014-02-04 NOTE — Progress Notes (Signed)
[place]  [phone] Fax:(336) [phone]  OFFICE PROGRESS NOTE   ID: Natalie Arias   DOB: Aug 27, 1961  MR#: [id number]  FUX#:[id number]   PCP: [name], PA-C SU: Fanny Skates, MD RAD ONC:  Kyung Rudd, MD   DIAGNOSES: Natalie Arias is a 53 y.o. 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   STAGE:  Breast cancer of lower-inner quadrant of right female breast   Primary site: Breast (Right)   Staging method: AJCC 7th Edition   Clinical: Stage IIA (T2, N0, cM0)   Summary: Stage IIA (T2, N0, cM0)   PRIOR THERAPY: #1 Right breast needle core biopsy performed on 09/30/2013 resulting in pathology that revealed an invasive mammary carcinoma likely ductal phenotype, intermediate grade, with a tumor that is estrogen receptor negative, progesterone receptor negative, HER-2/neu negative, with a Ki-67 80%.  #2 Bilateral breast MRI on 10/11/2013 showed within the right breast there was an irregularly marginated enhancing mass with central necrosis located in the upper-inner quadrant at the 2 o'clock position (the middle 1/3).  The mass measures 3.9 x 3.7 x 3.1 cm in size and was associated with clip artifact.  There were no additional areas of worrisome enhancement within the right breast.  The left breast did not have mass or abnormal enhancement.  No abnormal appearing lymph nodes (clinical stage IIA T2 N0).  #3 Patient was recommended neoadjuvant chemotherapy utilizing Adriamycin/Cytoxan x 4 cycles with neulasta support followed by Taxol/Carbo weekly x 12 weeks.      #4 Genetic testing on 01/06/14, results pending.    CURRENT THERAPY: Taxol/Carbo week six  INTERVAL HISTORY: Natalie Arias was seen today for evaluation prior to her sixth cycle of Taxol Carbo.  Her numbness is improved today.  She is taking Gabapentin 136m TID for  this.  She denies fevers, chills, nausea, vomiting, constipation, diarrhea, mouth pain, skin changes, nail changes, or any further concerns.  [place]    PAST MEDICAL HISTORY: Past Medical History  Diagnosis Date  . Asthma 20's    allergic asthema  . Seasonal allergies   . PONV (postoperative nausea and vomiting)   . Breast cancer dx'd 10/01/2013    right; triplen negative    PAST SURGICAL HISTORY: Past Surgical History  Procedure Laterality Date  . Laparoscopic endometriosis fulguration  1991  . Abdominal hysterectomy  1992  . Portacath placement N/A 10/18/2013    Procedure: INSERTION PORT-A-CATH WITH ULTRASOUND;  Surgeon: HAdin Hector MD;  Location: [facility];  Service: General;  Laterality: N/A;    FAMILY HISTORY: Family History  Problem Relation Age of Onset  . Lung cancer Maternal Uncle     smoker  . Breast cancer Paternal Aunt     dx in her late 676s maternal half sister to patient's father  . Breast cancer Maternal Grandmother [phone] . Lung cancer Maternal Uncle     smoker  . Hypertension Father   . Heart disease Father   . Parkinsonism Father   . Skin cancer Father   . Hypercholesterolemia Mother   . Gallstones Mother   . Healthy Brother   . COPD Maternal Grandfather   . Lung cancer Cousin     smoker; paternal cousin    SOCIAL HISTORY: History  Substance Use Topics  . Smoking status: Never Smoker   . Smokeless tobacco: Never Used  .  Alcohol Use: No     Comment: once a month    ALLERGIES: Allergies  Allergen Reactions  . Penicillins Hives    CURRENT MEDICATIONS: Current Outpatient Prescriptions  Medication Sig Dispense Refill  . B Complex-C (SUPER B COMPLEX PO) Take 1 tablet by mouth.      . gabapentin (NEURONTIN) 100 MG capsule Take 1 capsule (100 mg total) by mouth 3 (three) times daily.  90 capsule  1  . hydrocortisone (ANUSOL-HC) 2.5 % rectal cream Place 1 application rectally 2 (two) times daily.  30 g  0  .  ibuprofen (ADVIL,MOTRIN) 200 MG tablet Take 200 mg by mouth every 6 (six) hours as needed for pain.      Marland Kitchen lidocaine-prilocaine (EMLA) cream Apply topically as needed. To port-a-cath 1.5 hours before chemotherapy and cover  30 g  1  . SENNA PO Take by mouth.      . dexamethasone (DECADRON) 4 MG tablet Take 2 tablets (8 mg total) by mouth 2 (two) times daily with a meal. Take two times a day starting the day after chemotherapy for 3 days.  30 tablet  1  . LORazepam (ATIVAN) 0.5 MG tablet Take 1 tablet (0.5 mg total) by mouth every 6 (six) hours as needed (Nausea or vomiting).  30 tablet  0  . ondansetron (ZOFRAN) 8 MG tablet Take 1 tablet (8 mg total) by mouth 2 (two) times daily. Take two times a day starting the day after chemo for 3 days. Then take two times a day as needed for nausea or vomiting.  30 tablet  1  . polyethylene glycol (MIRALAX / GLYCOLAX) packet Take 17 g by mouth daily as needed.      . prochlorperazine (COMPAZINE) 10 MG tablet Take 1 tablet (10 mg total) by mouth every 6 (six) hours as needed (Nausea or vomiting).  30 tablet  1  . UNABLE TO FIND 1 each by Other route as needed. Cranial Prosthesis       No current facility-administered medications for this visit.   REVIEW OF SYSTEMS:  A 10 point review of systems was conducted and is otherwise negative except for what is noted above.    PHYSICAL EXAMINATION: Blood pressure 150/88, pulse 130, temperature 98.2 F (36.8 C), temperature source Oral, resp. rate 18, height '5\' 6"'  (1.676 m), weight 204 lb 9.6 oz (92.806 kg). GENERAL: Patient is a well appearing female in no acute distress HEENT:  Sclerae anicteric.  Oropharynx clear and moist. No ulcerations or evidence of oropharyngeal candidiasis. Neck is supple.  NODES:  No cervical, supraclavicular, or axillary lymphadenopathy palpated.  BREAST EXAM:  Small mass palpated in the upper inner quadrant of the right breast LUNGS:  Clear to auscultation bilaterally.  No wheezes or  rhonchi. HEART:  Regular rate and rhythm. No murmur appreciated. ABDOMEN:  Soft, nontender.  Positive, normoactive bowel sounds. No organomegaly palpated. MSK:  No focal spinal tenderness to palpation. Full range of motion bilaterally in the upper extremities. EXTREMITIES:  No peripheral edema.   SKIN:  Clear with no obvious rashes or skin changes. No nail dyscrasia. NEURO:  Nonfocal. Well oriented.  Appropriate affect. ECOG FS: 1 - Symptomatic but completely ambulatory  LABORATORIES:   Chemistry      Component Value Date/Time   NA 138 02/04/2014 1316   NA 140 10/14/2013 0930   K 3.8 02/04/2014 1316   K 4.7 10/14/2013 0930   CL 105 10/14/2013 0930   CO2 27 02/04/2014 1316  CO2 29 10/14/2013 0930   BUN 11.1 02/04/2014 1316   BUN 12 10/14/2013 0930   CREATININE 0.8 02/04/2014 1316   CREATININE 0.71 10/14/2013 0930      Component Value Date/Time   CALCIUM 9.0 02/04/2014 1316   CALCIUM 9.6 10/14/2013 0930   ALKPHOS 52 02/04/2014 1316   ALKPHOS 70 10/14/2013 0930   AST 22 02/04/2014 1316   AST 16 10/14/2013 0930   ALT 35 02/04/2014 1316   ALT 18 10/14/2013 0930   BILITOT 0.56 02/04/2014 1316   BILITOT 0.3 10/14/2013 0930      Lab Results  Component Value Date   WBC 3.3* 02/04/2014   HGB 10.7* 02/04/2014   HCT 31.5* 02/04/2014   MCV 94.8 02/04/2014   PLT 211 02/04/2014    STUDIES/IMAGING: 1.  2-D echocardiogram on 10/11/2013 showed a left ventricular ejection fraction of 60% - 65%.  ASSESSMENT: Natalie Arias is a 53 y.o. female diagnosed with triple negative invasive ductal carcinoma of the right breast in 09/2013:  #1 Intermediate grade invasive ductal carcinoma of the right breast. The mass measures 3.5 cm by ultrasound. Prognostic markers revealed the tumor to be estrogen receptor negative, progesterone receptor negative, HER-2/neu negative, with an elevated proliferation marker Ki-67 of 80%.   #2  Bilateral breast MRI on 10/11/2013 showed within the right breast there was an irregularly  marginated enhancing mass with central necrosis located in the upper-inner quadrant at the 2 o'clock position (the middle 1/3).  The mass measures 3.9 x 3.7 x 3.1 cm in size and was associated with clip artifact.  There were no additional areas of worrisome enhancement within the right breast.  The left breast did not have mass or abnormal enhancement.  No abnormal appearing lymph nodes (clinical stage IIA T2 N0).    #3  Patient started neoadjuvant chemotherapy of dose dense Adriamycin/Cytoxan with 4 planned doses on 10/27/2013.  Day 2 granulocyte support with [name] injection.  This was followed by neoadjuvant chemotherapy consisting of Taxol/carbo weekly x 12 weeks which started on 12/29/14.  #4  If patient does successfully undergo lumpectomy she will need radiation therapy.  #5 Patient has a family history of breast cancer and herself has triple negative breast cancer and underwent genetic counseling and testing on 01/06/14, results are pending.   PLAN:   #1 Mrs. Bar is doing well today.  Her CBC is stable.  I reviewed this with her in detail.  She will proceed with cycle 6 of 12 of weekly Taxol/Carbo.  A CMP is pending.  I ordered her MRI to be done around 03/01/14.    #2 Mrs. Tetzloff will return next week for labs, evaluation, and week seven of Taxol Carbo.     #3 Her neuropathy is improved with Gabapentin 126m TID.  We will continue this dose for now.    All questions answered.  Mrs. Lynn was encouraged to contact [place] the interim with any questions, concerns, or problems.  I spent 25 minutes counseling the patient face to face.  The total time spent in the appointment was 30 minutes.  LMinette Headland [phone]/06/2014  3:28 PM

## 2014-02-07 ENCOUNTER — Encounter: Payer: Self-pay | Admitting: Adult Health

## 2014-02-07 ENCOUNTER — Telehealth: Payer: Self-pay | Admitting: *Deleted

## 2014-02-07 NOTE — Telephone Encounter (Signed)
Lm  gv appt for MRI breast for 03/01/14@ 5pm....td

## 2014-02-07 NOTE — Telephone Encounter (Signed)
VERBAL ORDER AND READ BACK TO LINDSEY CORNETTO,NP- THE SWELLING IS A SIDE EFFECT OF THE GABAPENTIN. MONITOR AT THIS TIME BUT IF HER CONDITION WORSEN BEFORE PT.'S APPOINTMENT ON Friday CALL THIS OFFICE. CALLED PT. HER HANDS ARE A LITTLE RED BUT NO CRACKED AREAS. NOTIFIED PT. OF ABOVE INSTRUCTIONS. SHE VOICES UNDERSTANDING.

## 2014-02-11 ENCOUNTER — Ambulatory Visit (HOSPITAL_BASED_OUTPATIENT_CLINIC_OR_DEPARTMENT_OTHER): Payer: BC Managed Care – PPO | Admitting: Adult Health

## 2014-02-11 ENCOUNTER — Encounter: Payer: Self-pay | Admitting: Oncology

## 2014-02-11 ENCOUNTER — Other Ambulatory Visit: Payer: BC Managed Care – PPO

## 2014-02-11 ENCOUNTER — Telehealth: Payer: Self-pay | Admitting: *Deleted

## 2014-02-11 ENCOUNTER — Ambulatory Visit: Payer: BC Managed Care – PPO | Admitting: Adult Health

## 2014-02-11 ENCOUNTER — Encounter: Payer: Self-pay | Admitting: Adult Health

## 2014-02-11 ENCOUNTER — Ambulatory Visit (HOSPITAL_BASED_OUTPATIENT_CLINIC_OR_DEPARTMENT_OTHER): Payer: BC Managed Care – PPO

## 2014-02-11 ENCOUNTER — Other Ambulatory Visit (HOSPITAL_BASED_OUTPATIENT_CLINIC_OR_DEPARTMENT_OTHER): Payer: BC Managed Care – PPO

## 2014-02-11 VITALS — BP 121/79 | HR 104 | Temp 98.6°F | Resp 18 | Ht 66.0 in | Wt 204.8 lb

## 2014-02-11 DIAGNOSIS — C50311 Malignant neoplasm of lower-inner quadrant of right female breast: Secondary | ICD-10-CM

## 2014-02-11 DIAGNOSIS — G589 Mononeuropathy, unspecified: Secondary | ICD-10-CM

## 2014-02-11 DIAGNOSIS — Z171 Estrogen receptor negative status [ER-]: Secondary | ICD-10-CM

## 2014-02-11 DIAGNOSIS — C50319 Malignant neoplasm of lower-inner quadrant of unspecified female breast: Secondary | ICD-10-CM

## 2014-02-11 DIAGNOSIS — D709 Neutropenia, unspecified: Secondary | ICD-10-CM

## 2014-02-11 DIAGNOSIS — Z5111 Encounter for antineoplastic chemotherapy: Secondary | ICD-10-CM

## 2014-02-11 LAB — CBC WITH DIFFERENTIAL/PLATELET
BASO%: 0.5 % (ref 0.0–2.0)
Basophils Absolute: 0 10*3/uL (ref 0.0–0.1)
EOS ABS: 0 10*3/uL (ref 0.0–0.5)
EOS%: 1 % (ref 0.0–7.0)
HCT: 28.3 % — ABNORMAL LOW (ref 34.8–46.6)
HGB: 9.4 g/dL — ABNORMAL LOW (ref 11.6–15.9)
LYMPH%: 36.3 % (ref 14.0–49.7)
MCH: 31.3 pg (ref 25.1–34.0)
MCHC: 33.2 g/dL (ref 31.5–36.0)
MCV: 94.3 fL (ref 79.5–101.0)
MONO#: 0.2 10*3/uL (ref 0.1–0.9)
MONO%: 9.5 % (ref 0.0–14.0)
NEUT%: 52.7 % (ref 38.4–76.8)
NEUTROS ABS: 1.1 10*3/uL — AB (ref 1.5–6.5)
PLATELETS: 191 10*3/uL (ref 145–400)
RBC: 3 10*6/uL — AB (ref 3.70–5.45)
RDW: 16.1 % — AB (ref 11.2–14.5)
WBC: 2 10*3/uL — AB (ref 3.9–10.3)
lymph#: 0.7 10*3/uL — ABNORMAL LOW (ref 0.9–3.3)

## 2014-02-11 LAB — COMPREHENSIVE METABOLIC PANEL (CC13)
ALBUMIN: 3.5 g/dL (ref 3.5–5.0)
ALT: 38 U/L (ref 0–55)
AST: 26 U/L (ref 5–34)
Alkaline Phosphatase: 52 U/L (ref 40–150)
Anion Gap: 9 mEq/L (ref 3–11)
BUN: 13.2 mg/dL (ref 7.0–26.0)
CHLORIDE: 107 meq/L (ref 98–109)
CO2: 28 meq/L (ref 22–29)
Calcium: 9.4 mg/dL (ref 8.4–10.4)
Creatinine: 0.7 mg/dL (ref 0.6–1.1)
Glucose: 96 mg/dl (ref 70–140)
Potassium: 4.1 mEq/L (ref 3.5–5.1)
SODIUM: 143 meq/L (ref 136–145)
TOTAL PROTEIN: 5.7 g/dL — AB (ref 6.4–8.3)
Total Bilirubin: 0.49 mg/dL (ref 0.20–1.20)

## 2014-02-11 LAB — TECHNOLOGIST REVIEW

## 2014-02-11 MED ORDER — FAMOTIDINE IN NACL 20-0.9 MG/50ML-% IV SOLN
INTRAVENOUS | Status: AC
  Filled 2014-02-11: qty 50

## 2014-02-11 MED ORDER — SODIUM CHLORIDE 0.9 % IV SOLN
80.0000 mg/m2 | Freq: Once | INTRAVENOUS | Status: AC
  Administered 2014-02-11: 162 mg via INTRAVENOUS
  Filled 2014-02-11: qty 27

## 2014-02-11 MED ORDER — DIPHENHYDRAMINE HCL 50 MG/ML IJ SOLN
INTRAMUSCULAR | Status: AC
  Filled 2014-02-11: qty 1

## 2014-02-11 MED ORDER — SODIUM CHLORIDE 0.9 % IV SOLN
300.0000 mg | Freq: Once | INTRAVENOUS | Status: AC
  Administered 2014-02-11: 300 mg via INTRAVENOUS
  Filled 2014-02-11: qty 30

## 2014-02-11 MED ORDER — HEPARIN SOD (PORK) LOCK FLUSH 100 UNIT/ML IV SOLN
500.0000 [IU] | Freq: Once | INTRAVENOUS | Status: AC | PRN
  Administered 2014-02-11: 500 [IU]
  Filled 2014-02-11: qty 5

## 2014-02-11 MED ORDER — ONDANSETRON 16 MG/50ML IVPB (CHCC)
16.0000 mg | Freq: Once | INTRAVENOUS | Status: AC
  Administered 2014-02-11: 16 mg via INTRAVENOUS

## 2014-02-11 MED ORDER — DIPHENHYDRAMINE HCL 50 MG/ML IJ SOLN
50.0000 mg | Freq: Once | INTRAMUSCULAR | Status: AC
  Administered 2014-02-11: 50 mg via INTRAVENOUS

## 2014-02-11 MED ORDER — DEXAMETHASONE SODIUM PHOSPHATE 20 MG/5ML IJ SOLN
20.0000 mg | Freq: Once | INTRAMUSCULAR | Status: AC
  Administered 2014-02-11: 20 mg via INTRAVENOUS

## 2014-02-11 MED ORDER — SODIUM CHLORIDE 0.9 % IJ SOLN
10.0000 mL | INTRAMUSCULAR | Status: DC | PRN
  Administered 2014-02-11: 10 mL
  Filled 2014-02-11: qty 10

## 2014-02-11 MED ORDER — DEXAMETHASONE SODIUM PHOSPHATE 20 MG/5ML IJ SOLN
INTRAMUSCULAR | Status: AC
  Filled 2014-02-11: qty 5

## 2014-02-11 MED ORDER — FAMOTIDINE IN NACL 20-0.9 MG/50ML-% IV SOLN
20.0000 mg | Freq: Once | INTRAVENOUS | Status: AC
  Administered 2014-02-11: 20 mg via INTRAVENOUS

## 2014-02-11 MED ORDER — ONDANSETRON 16 MG/50ML IVPB (CHCC)
INTRAVENOUS | Status: AC
  Filled 2014-02-11: qty 16

## 2014-02-11 MED ORDER — SODIUM CHLORIDE 0.9 % IV SOLN
Freq: Once | INTRAVENOUS | Status: AC
  Administered 2014-02-11: 11:00:00 via INTRAVENOUS

## 2014-02-11 NOTE — Telephone Encounter (Signed)
appts made and printed...td 

## 2014-02-11 NOTE — Progress Notes (Signed)
[place]  Telephone:(336) [phone] Fax:(336) [phone]  OFFICE PROGRESS NOTE   ID: Natalie Arias   DOB: [birth year]  MR#: [id number]  AFB#:[id number]   PCP: [name], PA-C SU: Fanny Skates, MD RAD ONC:  Kyung Rudd, MD   DIAGNOSES: Natalie Arias is a 53 y.o. 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   STAGE:  Breast cancer of lower-inner quadrant of right female breast   Primary site: Breast (Right)   Staging method: AJCC 7th Edition   Clinical: Stage IIA (T2, N0, cM0)   Summary: Stage IIA (T2, N0, cM0)   PRIOR THERAPY: #1 Right breast needle core biopsy performed on 09/30/2013 resulting in pathology that revealed an invasive mammary carcinoma likely ductal phenotype, intermediate grade, with a tumor that is estrogen receptor negative, progesterone receptor negative, HER-2/neu negative, with a Ki-67 80%.  #2 Bilateral breast MRI on 10/11/2013 showed within the right breast there was an irregularly marginated enhancing mass with central necrosis located in the upper-inner quadrant at the 2 o'clock position (the middle 1/3).  The mass measures 3.9 x 3.7 x 3.1 cm in size and was associated with clip artifact.  There were no additional areas of worrisome enhancement within the right breast.  The left breast did not have mass or abnormal enhancement.  No abnormal appearing lymph nodes (clinical stage IIA T2 N0).  #3 Patient was recommended neoadjuvant chemotherapy utilizing Adriamycin/Cytoxan x 4 cycles with neulasta support followed by Taxol/Carbo weekly x 12 weeks.      #4 Genetic testing on 01/06/14, results pending.    CURRENT THERAPY: Taxol/Carbo week seven  INTERVAL HISTORY: Natalie Arias was seen today for evaluation prior to her seventh cycle of Taxol Carbo.  Her numbness continues to be improved. She is taking Gabapentin  167m TID for this.  She denies fevers, chills, nausea, vomiting, constipation, diarrhea, mouth pain, skin changes, nail changes, or any further concerns. She is hoping to go out of the country for work next week from Monday through Wednesday.    PAST MEDICAL HISTORY: Past Medical History  Diagnosis Date  . Asthma 20's    allergic asthema  . Seasonal allergies   . PONV (postoperative nausea and vomiting)   . Breast cancer dx'd 10/01/2013    right; triplen negative    PAST SURGICAL HISTORY: Past Surgical History  Procedure Laterality Date  . Laparoscopic endometriosis fulguration  1991  . Abdominal hysterectomy  1992  . Portacath placement N/A 10/18/2013    Procedure: INSERTION PORT-A-CATH WITH ULTRASOUND;  Surgeon: HAdin Hector MD;  Location: [facility];  Service: General;  Laterality: N/A;    FAMILY HISTORY: Family History  Problem Relation Age of Onset  . Lung cancer Maternal Uncle     smoker  . Breast cancer Paternal Aunt     dx in her late 673s maternal half sister to patient's father  . Breast cancer Maternal Grandmother 658 . Lung cancer Maternal Uncle     smoker  . Hypertension Father   . Heart disease Father   . Parkinsonism Father   . Skin cancer Father   . Hypercholesterolemia Mother   . Gallstones Mother   . Healthy Brother   . COPD Maternal Grandfather   . Lung cancer Cousin     smoker; paternal cousin    SOCIAL HISTORY: History  Substance Use Topics  . Smoking status:  Never Smoker   . Smokeless tobacco: Never Used  . Alcohol Use: No     Comment: once a month    ALLERGIES: Allergies  Allergen Reactions  . Penicillins Hives    CURRENT MEDICATIONS: Current Outpatient Prescriptions  Medication Sig Dispense Refill  . B Complex-C (SUPER B COMPLEX PO) Take 1 tablet by mouth.      . gabapentin (NEURONTIN) 100 MG capsule Take 1 capsule (100 mg total) by mouth 3 (three) times daily.  90 capsule  1  . hydrocortisone (ANUSOL-HC) 2.5 % rectal cream  Place 1 application rectally 2 (two) times daily.  30 g  0  . ibuprofen (ADVIL,MOTRIN) 200 MG tablet Take 200 mg by mouth every 6 (six) hours as needed for pain.      Marland Kitchen lidocaine-prilocaine (EMLA) cream Apply topically as needed. To port-a-cath 1.5 hours before chemotherapy and cover  30 g  1  . LORazepam (ATIVAN) 0.5 MG tablet Take 1 tablet (0.5 mg total) by mouth every 6 (six) hours as needed (Nausea or vomiting).  30 tablet  0  . SENNA PO Take by mouth.      Marland Kitchen UNABLE TO FIND 1 each by Other route as needed. Cranial Prosthesis      . dexamethasone (DECADRON) 4 MG tablet Take 2 tablets (8 mg total) by mouth 2 (two) times daily with a meal. Take two times a day starting the day after chemotherapy for 3 days.  30 tablet  1  . ondansetron (ZOFRAN) 8 MG tablet Take 1 tablet (8 mg total) by mouth 2 (two) times daily. Take two times a day starting the day after chemo for 3 days. Then take two times a day as needed for nausea or vomiting.  30 tablet  1  . polyethylene glycol (MIRALAX / GLYCOLAX) packet Take 17 g by mouth daily as needed.      . prochlorperazine (COMPAZINE) 10 MG tablet Take 1 tablet (10 mg total) by mouth every 6 (six) hours as needed (Nausea or vomiting).  30 tablet  1   No current facility-administered medications for this visit.   REVIEW OF SYSTEMS:  A 10 point review of systems was conducted and is otherwise negative except for what is noted above.    PHYSICAL EXAMINATION: Blood pressure 121/79, pulse 104, temperature 98.6 F (37 C), temperature source Oral, resp. rate 18, height '5\' 6"'  (1.676 m), weight 204 lb 12.8 oz (92.897 kg). GENERAL: Patient is a well appearing female in no acute distress HEENT:  Sclerae anicteric.  Oropharynx clear and moist. No ulcerations or evidence of oropharyngeal candidiasis. Neck is supple.  NODES:  No cervical, supraclavicular, or axillary lymphadenopathy palpated.  BREAST EXAM:  Small mass palpated in the upper inner quadrant of the right  breast LUNGS:  Clear to auscultation bilaterally.  No wheezes or rhonchi. HEART:  Regular rate and rhythm. No murmur appreciated. ABDOMEN:  Soft, nontender.  Positive, normoactive bowel sounds. No organomegaly palpated. MSK:  No focal spinal tenderness to palpation. Full range of motion bilaterally in the upper extremities. EXTREMITIES:  No peripheral edema.   SKIN:  Clear with no obvious rashes or skin changes. No nail dyscrasia. NEURO:  Nonfocal. Well oriented.  Appropriate affect. ECOG FS: 1 - Symptomatic but completely ambulatory  LABORATORIES:   Chemistry      Component Value Date/Time   NA 138 02/04/2014 1316   NA 140 10/14/2013 0930   K 3.8 02/04/2014 1316   K 4.7 10/14/2013 0930   CL  105 10/14/2013 0930   CO2 27 02/04/2014 1316   CO2 29 10/14/2013 0930   BUN 11.1 02/04/2014 1316   BUN 12 10/14/2013 0930   CREATININE 0.8 02/04/2014 1316   CREATININE 0.71 10/14/2013 0930      Component Value Date/Time   CALCIUM 9.0 02/04/2014 1316   CALCIUM 9.6 10/14/2013 0930   ALKPHOS 52 02/04/2014 1316   ALKPHOS 70 10/14/2013 0930   AST 22 02/04/2014 1316   AST 16 10/14/2013 0930   ALT 35 02/04/2014 1316   ALT 18 10/14/2013 0930   BILITOT 0.56 02/04/2014 1316   BILITOT 0.3 10/14/2013 0930      Lab Results  Component Value Date   WBC 2.0* 02/11/2014   HGB 9.4* 02/11/2014   HCT 28.3* 02/11/2014   MCV 94.3 02/11/2014   PLT 191 02/11/2014    STUDIES/IMAGING: 1.  2-D echocardiogram on 10/11/2013 showed a left ventricular ejection fraction of 60% - 65%.  ASSESSMENT: Natalie Arias is a 53 y.o. female diagnosed with triple negative invasive ductal carcinoma of the right breast in 09/2013:  #1 Intermediate grade invasive ductal carcinoma of the right breast. The mass measures 3.5 cm by ultrasound. Prognostic markers revealed the tumor to be estrogen receptor negative, progesterone receptor negative, HER-2/neu negative, with an elevated proliferation marker Ki-67 of 80%.   #2  Bilateral breast MRI on  10/11/2013 showed within the right breast there was an irregularly marginated enhancing mass with central necrosis located in the upper-inner quadrant at the 2 o'clock position (the middle 1/3).  The mass measures 3.9 x 3.7 x 3.1 cm in size and was associated with clip artifact.  There were no additional areas of worrisome enhancement within the right breast.  The left breast did not have mass or abnormal enhancement.  No abnormal appearing lymph nodes (clinical stage IIA T2 N0).    #3  Patient started neoadjuvant chemotherapy of dose dense Adriamycin/Cytoxan with 4 planned doses on 10/27/2013.  Day 2 granulocyte support with [name] injection.  This was followed by neoadjuvant chemotherapy consisting of Taxol/carbo weekly x 12 weeks which started on 12/29/14.  #4  If patient does successfully undergo lumpectomy she will need radiation therapy.  #5 Patient has a family history of breast cancer and herself has triple negative breast cancer and underwent genetic counseling and testing on 01/06/14, results are pending.   PLAN:   #1 Natalie Arias is doing well today.  She is slightly neutropenic today.  Her ANC is 1.1.  I recommended she not go out of the country.  She will receive GCSF Saturday, Monday, Tuesday, Wednesday, 449mg.  She will proceed with week 7 of 12 planned cycles of Taxol/Carbo today.    #2 Natalie Arias will return next week for labs, evaluation, and week eight of Taxol Carbo.     #3 Her neuropathy is improved with Gabapentin 1038mTID.  We will continue this dose for now.    All questions answered.  Natalie Arias was encouraged to contact [place] the interim with any questions, concerns, or problems.  I spent 25 minutes counseling the patient face to face.  The total time spent in the appointment was 30 minutes.  LiMinette [phone]/13/2015  10:15 AM

## 2014-02-11 NOTE — Progress Notes (Signed)
Ok to treat with ANC 1.1 per Charlestine Massed, NP.

## 2014-02-11 NOTE — Patient Instructions (Signed)
[facility] Discharge Instructions for Patients Receiving Chemotherapy  Today you received the following chemotherapy agents: Taxol and Carboplatin.   To help prevent nausea and vomiting after your treatment, we encourage you to take your nausea medication as directed.    If you develop nausea and vomiting that is not controlled by your nausea medication, call the clinic.   BELOW ARE SYMPTOMS THAT SHOULD BE REPORTED IMMEDIATELY:  *FEVER GREATER THAN 100.5 F  *CHILLS WITH OR WITHOUT FEVER  NAUSEA AND VOMITING THAT IS NOT CONTROLLED WITH YOUR NAUSEA MEDICATION  *UNUSUAL SHORTNESS OF BREATH  *UNUSUAL BRUISING OR BLEEDING  TENDERNESS IN MOUTH AND THROAT WITH OR WITHOUT PRESENCE OF ULCERS  *URINARY PROBLEMS  *BOWEL PROBLEMS  UNUSUAL RASH Items with * indicate a potential emergency and should be followed up as soon as possible.  Feel free to call the clinic you have any questions or concerns. The clinic phone number is [phone].    

## 2014-02-12 ENCOUNTER — Ambulatory Visit (HOSPITAL_BASED_OUTPATIENT_CLINIC_OR_DEPARTMENT_OTHER): Payer: BC Managed Care – PPO

## 2014-02-12 VITALS — BP 136/77 | HR 102 | Temp 97.5°F | Resp 18

## 2014-02-12 DIAGNOSIS — D709 Neutropenia, unspecified: Secondary | ICD-10-CM

## 2014-02-12 DIAGNOSIS — C50311 Malignant neoplasm of lower-inner quadrant of right female breast: Secondary | ICD-10-CM

## 2014-02-12 MED ORDER — TBO-FILGRASTIM 480 MCG/0.8ML ~~LOC~~ SOSY
480.0000 ug | PREFILLED_SYRINGE | Freq: Once | SUBCUTANEOUS | Status: AC
  Administered 2014-02-12: 480 ug via SUBCUTANEOUS

## 2014-02-12 NOTE — Patient Instructions (Signed)
Tbo-Filgrastim injection What is this medicine? TBO-FILGRASTIM is used to help decrease the time you have low amounts of white blood cells after cancer treatment. It helps the body make more white blood cells. Increasing the amount of white blood cells helps to decrease the risk of infection and fever. This medicine may be used for other purposes; ask your health care provider or pharmacist if you have questions. COMMON BRAND NAME(S): Granix What should I tell my health care provider before I take this medicine? They need to know if you have any of these conditions: -history of blood diseases, like sickle cell anemia or leukemia -an unusual or allergic reaction to tbo-filgrastim, filgrastim, pegfilgrastim, other medicines, foods, dyes, or preservatives -pregnant or trying to get pregnant -breast-feeding How should I use this medicine? This medicine is for injection under the skin. It is given by a health care professional in a hospital or clinic setting. Talk to your pediatrician regarding the use of this medicine in children. Special care may be needed. Overdosage: If you think you've taken too much of this medicine contact a poison control center or emergency room at once. Overdosage: If you think you have taken too much of this medicine contact a poison control center or emergency room at once. NOTE: This medicine is only for you. Do not share this medicine with others. What if I miss a dose? Keep appointments for follow-up doses as directed. It is important not to miss your dose. Call your doctor or health care professional if you are unable to keep an appointment. What may interact with this medicine? -lithium This list may not describe all possible interactions. Give your health care provider a list of all the medicines, herbs, non-prescription drugs, or dietary supplements you use. Also tell them if you smoke, drink alcohol, or use illegal drugs. Some items may interact with your  medicine. What should I watch for while using this medicine? Your condition will be monitored carefully while you are receiving this medicine. You may need blood work done while you are taking this medicine. Avoid taking products that contain aspirin, acetaminophen, ibuprofen, naproxen, or ketoprofen unless instructed by your doctor. These medicines may hide a fever. Call your doctor or health care professional for advice if you get a fever, chills or sore throat, or other symptoms of a cold or flu. Do not treat yourself. What side effects may I notice from receiving this medicine? Side effects that you should report to your doctor or health care professional as soon as possible: -allergic reactions like skin rash, itching or hives, swelling of the face, lips, or tongue -breathing problems -fever -stomach pain  Side effects that usually do not require medical attention (Report these to your doctor or health care professional if they continue or are bothersome.): -bone pain This list may not describe all possible side effects. Call your doctor for medical advice about side effects. You may report side effects to FDA at 1-800-FDA-1088. Where should I keep my medicine? This drug is given in a hospital or clinic and will not be stored at home. NOTE: This sheet is a summary. It may not cover all possible information. If you have questions about this medicine, talk to your doctor, pharmacist, or health care provider.  2014, Elsevier/Gold Standard. (2011-09-04 16:41:24)  

## 2014-02-14 ENCOUNTER — Ambulatory Visit: Payer: BC Managed Care – PPO

## 2014-02-14 ENCOUNTER — Ambulatory Visit (HOSPITAL_BASED_OUTPATIENT_CLINIC_OR_DEPARTMENT_OTHER): Payer: BC Managed Care – PPO

## 2014-02-14 VITALS — BP 136/79 | HR 96 | Temp 98.3°F

## 2014-02-14 DIAGNOSIS — D709 Neutropenia, unspecified: Secondary | ICD-10-CM

## 2014-02-14 DIAGNOSIS — C50311 Malignant neoplasm of lower-inner quadrant of right female breast: Secondary | ICD-10-CM

## 2014-02-14 DIAGNOSIS — C50319 Malignant neoplasm of lower-inner quadrant of unspecified female breast: Secondary | ICD-10-CM

## 2014-02-14 MED ORDER — TBO-FILGRASTIM 480 MCG/0.8ML ~~LOC~~ SOSY
480.0000 ug | PREFILLED_SYRINGE | Freq: Once | SUBCUTANEOUS | Status: AC
  Administered 2014-02-14: 480 ug via SUBCUTANEOUS
  Filled 2014-02-14: qty 0.8

## 2014-02-15 ENCOUNTER — Ambulatory Visit (HOSPITAL_BASED_OUTPATIENT_CLINIC_OR_DEPARTMENT_OTHER): Payer: BC Managed Care – PPO

## 2014-02-15 VITALS — BP 129/81 | HR 101 | Temp 98.7°F

## 2014-02-15 DIAGNOSIS — C50311 Malignant neoplasm of lower-inner quadrant of right female breast: Secondary | ICD-10-CM

## 2014-02-15 DIAGNOSIS — D709 Neutropenia, unspecified: Secondary | ICD-10-CM

## 2014-02-15 DIAGNOSIS — C50319 Malignant neoplasm of lower-inner quadrant of unspecified female breast: Secondary | ICD-10-CM

## 2014-02-15 MED ORDER — TBO-FILGRASTIM 480 MCG/0.8ML ~~LOC~~ SOSY
480.0000 ug | PREFILLED_SYRINGE | Freq: Once | SUBCUTANEOUS | Status: AC
  Administered 2014-02-15: 480 ug via SUBCUTANEOUS
  Filled 2014-02-15: qty 0.8

## 2014-02-16 ENCOUNTER — Encounter: Payer: Self-pay | Admitting: Adult Health

## 2014-02-16 ENCOUNTER — Other Ambulatory Visit: Payer: Self-pay | Admitting: Adult Health

## 2014-02-16 ENCOUNTER — Ambulatory Visit (HOSPITAL_BASED_OUTPATIENT_CLINIC_OR_DEPARTMENT_OTHER): Payer: BC Managed Care – PPO

## 2014-02-16 ENCOUNTER — Telehealth: Payer: Self-pay | Admitting: *Deleted

## 2014-02-16 VITALS — BP 118/72 | HR 132 | Temp 98.9°F

## 2014-02-16 DIAGNOSIS — J329 Chronic sinusitis, unspecified: Secondary | ICD-10-CM

## 2014-02-16 DIAGNOSIS — C50311 Malignant neoplasm of lower-inner quadrant of right female breast: Secondary | ICD-10-CM

## 2014-02-16 DIAGNOSIS — D709 Neutropenia, unspecified: Secondary | ICD-10-CM

## 2014-02-16 DIAGNOSIS — C50319 Malignant neoplasm of lower-inner quadrant of unspecified female breast: Secondary | ICD-10-CM

## 2014-02-16 MED ORDER — TBO-FILGRASTIM 480 MCG/0.8ML ~~LOC~~ SOSY
480.0000 ug | PREFILLED_SYRINGE | Freq: Once | SUBCUTANEOUS | Status: AC
  Administered 2014-02-16: 480 ug via SUBCUTANEOUS
  Filled 2014-02-16: qty 0.8

## 2014-02-16 MED ORDER — AZITHROMYCIN 250 MG PO TABS: ORAL_TABLET | ORAL | Status: DC

## 2014-02-16 NOTE — Telephone Encounter (Signed)
Charlestine Massed notified of patient's report of sinusitis.  Azithromycin and saline nasal spay ordered.  Called patient with these instructions.

## 2014-02-18 ENCOUNTER — Encounter: Payer: Self-pay | Admitting: Oncology

## 2014-02-18 ENCOUNTER — Encounter: Payer: Self-pay | Admitting: Adult Health

## 2014-02-18 ENCOUNTER — Other Ambulatory Visit (HOSPITAL_BASED_OUTPATIENT_CLINIC_OR_DEPARTMENT_OTHER): Payer: BC Managed Care – PPO

## 2014-02-18 ENCOUNTER — Ambulatory Visit (HOSPITAL_BASED_OUTPATIENT_CLINIC_OR_DEPARTMENT_OTHER): Payer: BC Managed Care – PPO | Admitting: Adult Health

## 2014-02-18 ENCOUNTER — Telehealth: Payer: Self-pay | Admitting: Oncology

## 2014-02-18 ENCOUNTER — Ambulatory Visit (HOSPITAL_BASED_OUTPATIENT_CLINIC_OR_DEPARTMENT_OTHER): Payer: BC Managed Care – PPO

## 2014-02-18 VITALS — BP 126/83 | HR 118 | Temp 98.3°F | Resp 20 | Ht 66.0 in | Wt 208.8 lb

## 2014-02-18 DIAGNOSIS — Z171 Estrogen receptor negative status [ER-]: Secondary | ICD-10-CM

## 2014-02-18 DIAGNOSIS — C50311 Malignant neoplasm of lower-inner quadrant of right female breast: Secondary | ICD-10-CM

## 2014-02-18 DIAGNOSIS — Z803 Family history of malignant neoplasm of breast: Secondary | ICD-10-CM

## 2014-02-18 DIAGNOSIS — C50319 Malignant neoplasm of lower-inner quadrant of unspecified female breast: Secondary | ICD-10-CM

## 2014-02-18 DIAGNOSIS — Z5111 Encounter for antineoplastic chemotherapy: Secondary | ICD-10-CM

## 2014-02-18 DIAGNOSIS — G609 Hereditary and idiopathic neuropathy, unspecified: Secondary | ICD-10-CM

## 2014-02-18 LAB — CBC WITH DIFFERENTIAL/PLATELET
BASO%: 0.5 % (ref 0.0–2.0)
Basophils Absolute: 0 10*3/uL (ref 0.0–0.1)
EOS%: 0.2 % (ref 0.0–7.0)
Eosinophils Absolute: 0 10*3/uL (ref 0.0–0.5)
HEMATOCRIT: 28.4 % — AB (ref 34.8–46.6)
HEMOGLOBIN: 9.4 g/dL — AB (ref 11.6–15.9)
LYMPH%: 26.9 % (ref 14.0–49.7)
MCH: 31.8 pg (ref 25.1–34.0)
MCHC: 32.9 g/dL (ref 31.5–36.0)
MCV: 96.6 fL (ref 79.5–101.0)
MONO#: 0.7 10*3/uL (ref 0.1–0.9)
MONO%: 11.7 % (ref 0.0–14.0)
NEUT#: 3.7 10*3/uL (ref 1.5–6.5)
NEUT%: 60.7 % (ref 38.4–76.8)
Platelets: 162 10*3/uL (ref 145–400)
RBC: 2.94 10*6/uL — ABNORMAL LOW (ref 3.70–5.45)
RDW: 17.5 % — ABNORMAL HIGH (ref 11.2–14.5)
WBC: 6.1 10*3/uL (ref 3.9–10.3)
lymph#: 1.6 10*3/uL (ref 0.9–3.3)

## 2014-02-18 LAB — COMPREHENSIVE METABOLIC PANEL (CC13)
ALBUMIN: 3.5 g/dL (ref 3.5–5.0)
ALK PHOS: 80 U/L (ref 40–150)
ALT: 40 U/L (ref 0–55)
AST: 24 U/L (ref 5–34)
Anion Gap: 8 mEq/L (ref 3–11)
BUN: 13.9 mg/dL (ref 7.0–26.0)
CALCIUM: 8.6 mg/dL (ref 8.4–10.4)
CHLORIDE: 106 meq/L (ref 98–109)
CO2: 26 mEq/L (ref 22–29)
Creatinine: 0.7 mg/dL (ref 0.6–1.1)
GLUCOSE: 111 mg/dL (ref 70–140)
POTASSIUM: 3.7 meq/L (ref 3.5–5.1)
Sodium: 141 mEq/L (ref 136–145)
Total Bilirubin: 0.3 mg/dL (ref 0.20–1.20)
Total Protein: 5.9 g/dL — ABNORMAL LOW (ref 6.4–8.3)

## 2014-02-18 MED ORDER — DEXAMETHASONE SODIUM PHOSPHATE 20 MG/5ML IJ SOLN
INTRAMUSCULAR | Status: AC
  Filled 2014-02-18: qty 5

## 2014-02-18 MED ORDER — SODIUM CHLORIDE 0.9 % IV SOLN
Freq: Once | INTRAVENOUS | Status: AC
  Administered 2014-02-18: 15:00:00 via INTRAVENOUS

## 2014-02-18 MED ORDER — FAMOTIDINE IN NACL 20-0.9 MG/50ML-% IV SOLN
INTRAVENOUS | Status: AC
  Filled 2014-02-18: qty 50

## 2014-02-18 MED ORDER — FAMOTIDINE IN NACL 20-0.9 MG/50ML-% IV SOLN
20.0000 mg | Freq: Once | INTRAVENOUS | Status: AC
  Administered 2014-02-18: 20 mg via INTRAVENOUS

## 2014-02-18 MED ORDER — DIPHENHYDRAMINE HCL 50 MG/ML IJ SOLN
INTRAMUSCULAR | Status: AC
  Filled 2014-02-18: qty 1

## 2014-02-18 MED ORDER — SODIUM CHLORIDE 0.9 % IV SOLN
80.0000 mg/m2 | Freq: Once | INTRAVENOUS | Status: AC
  Administered 2014-02-18: 162 mg via INTRAVENOUS
  Filled 2014-02-18: qty 27

## 2014-02-18 MED ORDER — DEXAMETHASONE SODIUM PHOSPHATE 20 MG/5ML IJ SOLN
20.0000 mg | Freq: Once | INTRAMUSCULAR | Status: AC
  Administered 2014-02-18: 20 mg via INTRAVENOUS

## 2014-02-18 MED ORDER — SODIUM CHLORIDE 0.9 % IV SOLN
300.0000 mg | Freq: Once | INTRAVENOUS | Status: AC
  Administered 2014-02-18: 300 mg via INTRAVENOUS
  Filled 2014-02-18: qty 30

## 2014-02-18 MED ORDER — ONDANSETRON 16 MG/50ML IVPB (CHCC)
INTRAVENOUS | Status: AC
  Filled 2014-02-18: qty 16

## 2014-02-18 MED ORDER — HEPARIN SOD (PORK) LOCK FLUSH 100 UNIT/ML IV SOLN
500.0000 [IU] | Freq: Once | INTRAVENOUS | Status: AC | PRN
  Administered 2014-02-18: 500 [IU]
  Filled 2014-02-18: qty 5

## 2014-02-18 MED ORDER — ONDANSETRON 16 MG/50ML IVPB (CHCC)
16.0000 mg | Freq: Once | INTRAVENOUS | Status: AC
  Administered 2014-02-18: 16 mg via INTRAVENOUS

## 2014-02-18 MED ORDER — DIPHENHYDRAMINE HCL 50 MG/ML IJ SOLN
50.0000 mg | Freq: Once | INTRAMUSCULAR | Status: AC
  Administered 2014-02-18: 50 mg via INTRAVENOUS

## 2014-02-18 MED ORDER — SODIUM CHLORIDE 0.9 % IJ SOLN
10.0000 mL | INTRAMUSCULAR | Status: DC | PRN
  Administered 2014-02-18: 10 mL
  Filled 2014-02-18: qty 10

## 2014-02-18 NOTE — Progress Notes (Signed)
Natalie Arias  [phone] [phone]  OFFICE PROGRESS NOTE   ID: Natalie Arias   DOB: 1961-07-25  MR#: [id number]  NIO#:[id number]   PCP: [name], PA-C SU: Fanny Skates, MD RAD ONC:  Kyung Rudd, MD   DIAGNOSES: Natalie Arias is a 53 y.o. 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   STAGE:  Breast cancer of lower-inner quadrant of right female breast   Primary site: Breast (Right)   Staging method: AJCC 7th Edition   Clinical: Stage IIA (T2, N0, cM0)   Summary: Stage IIA (T2, N0, cM0)   PRIOR THERAPY: #1 Right breast needle core biopsy performed on 09/30/2013 resulting in pathology that revealed an invasive mammary carcinoma likely ductal phenotype, intermediate grade, with a tumor that is estrogen receptor negative, progesterone receptor negative, HER-2/neu negative, with a Ki-67 80%.  #2 Bilateral breast MRI on 10/11/2013 showed within the right breast there was an irregularly marginated enhancing mass with central necrosis located in the upper-inner quadrant at the 2 o'clock position (the middle 1/3).  The mass measures 3.9 x 3.7 x 3.1 cm in size and was associated with clip artifact.  There were no additional areas of worrisome enhancement within the right breast.  The left breast did not have mass or abnormal enhancement.  No abnormal appearing lymph nodes (clinical stage IIA T2 N0).  #3 Patient was recommended neoadjuvant chemotherapy utilizing Adriamycin/Cytoxan x 4 cycles with neulasta support followed by Taxol/Carbo weekly x 12 weeks.      #4 Genetic testing on 01/06/14, results pending.    CURRENT THERAPY: Taxol/Carbo week eight  INTERVAL HISTORY: Natalie Arias was seen today for evaluation prior to receiving her eighth cycle of TAxol Carbo.  She is doing well.  She tolerated the Neupogen injection  relatively well.  She denies fevers, chills, nausea, vomiting, constipation, diarrhea, or any further concerns.  Her numbness continues to be stable with her taking Gabapentin TID.    PAST MEDICAL HISTORY: Past Medical History  Diagnosis Date  . Asthma 20's    allergic asthema  . Seasonal allergies   . PONV (postoperative nausea and vomiting)   . Breast cancer dx'd 10/01/2013    right; triplen negative    PAST SURGICAL HISTORY: Past Surgical History  Procedure Laterality Date  . Laparoscopic endometriosis fulguration  1991  . Abdominal hysterectomy  1992  . Portacath placement N/A 10/18/2013    Procedure: INSERTION PORT-A-CATH WITH ULTRASOUND;  Surgeon: Adin Hector, MD;  Location: [facility];  Service: General;  Laterality: N/A;    FAMILY HISTORY: Family History  Problem Relation Age of Onset  . Lung cancer Maternal Uncle     smoker  . Breast cancer Paternal Aunt     dx in her late 76s; maternal half sister to patient's father  . Breast cancer Maternal Grandmother 3  . Lung cancer Maternal Uncle     smoker  . Hypertension Father   . Heart disease Father   . Parkinsonism Father   . Skin cancer Father   . Hypercholesterolemia Mother   . Gallstones Mother   . Healthy Brother   . COPD Maternal Grandfather   . Lung cancer Cousin     smoker; paternal cousin    SOCIAL HISTORY: History  Substance Use Topics  . Smoking status: Never Smoker   . Smokeless tobacco: Never Used  . Alcohol  Use: No     Comment: once a month    ALLERGIES: Allergies  Allergen Reactions  . Penicillins Hives    CURRENT MEDICATIONS: Current Outpatient Prescriptions  Medication Sig Dispense Refill  . azithromycin (ZITHROMAX) 250 MG tablet 2 tabs po on day 1, then one tab daily until complete  6 each  0  . B Complex-C (SUPER B COMPLEX PO) Take 1 tablet by mouth.      . gabapentin (NEURONTIN) 100 MG capsule Take 1 capsule (100 mg total) by mouth 3 (three) times daily.  90 capsule  1  .  hydrocortisone (ANUSOL-HC) 2.5 % rectal cream Place 1 application rectally 2 (two) times daily.  30 g  0  . lidocaine-prilocaine (EMLA) cream Apply topically as needed. To port-a-cath 1.5 hours before chemotherapy and cover  30 g  1  . LORazepam (ATIVAN) 0.5 MG tablet Take 1 tablet (0.5 mg total) by mouth every 6 (six) hours as needed (Nausea or vomiting).  30 tablet  0  . polyethylene glycol (MIRALAX / GLYCOLAX) packet Take 17 g by mouth daily as needed.      . SENNA PO Take by mouth.      Marland Kitchen UNABLE TO FIND 1 each by Other route as needed. Cranial Prosthesis      . dexamethasone (DECADRON) 4 MG tablet Take 2 tablets (8 mg total) by mouth 2 (two) times daily with a meal. Take two times a day starting the day after chemotherapy for 3 days.  30 tablet  1  . ibuprofen (ADVIL,MOTRIN) 200 MG tablet Take 200 mg by mouth every 6 (six) hours as needed for pain.      Marland Kitchen ondansetron (ZOFRAN) 8 MG tablet Take 1 tablet (8 mg total) by mouth 2 (two) times daily. Take two times a day starting the day after chemo for 3 days. Then take two times a day as needed for nausea or vomiting.  30 tablet  1  . prochlorperazine (COMPAZINE) 10 MG tablet Take 1 tablet (10 mg total) by mouth every 6 (six) hours as needed (Nausea or vomiting).  30 tablet  1   No current facility-administered medications for this visit.   REVIEW OF SYSTEMS:  A 10 point review of systems was conducted and is otherwise negative except for what is noted above.    PHYSICAL EXAMINATION: Blood pressure 126/83, pulse 118, temperature 98.3 F (36.8 C), temperature source Oral, resp. rate 20, height '5\' 6"'  (1.676 m), weight 208 lb 12.8 oz (94.711 kg). GENERAL: Patient is a well appearing female in no acute distress HEENT:  Sclerae anicteric.  Oropharynx clear and moist. No ulcerations or evidence of oropharyngeal candidiasis. Neck is supple.  NODES:  No cervical, supraclavicular, or axillary lymphadenopathy palpated.  BREAST EXAM:  Small mass palpated  in the upper inner quadrant of the right breast LUNGS:  Clear to auscultation bilaterally.  No wheezes or rhonchi. HEART:  Regular rate and rhythm. No murmur appreciated. ABDOMEN:  Soft, nontender.  Positive, normoactive bowel sounds. No organomegaly palpated. MSK:  No focal spinal tenderness to palpation. Full range of motion bilaterally in the upper extremities. EXTREMITIES:  No peripheral edema.   SKIN:  Clear with no obvious rashes or skin changes. No nail dyscrasia. NEURO:  Nonfocal. Well oriented.  Appropriate affect. ECOG FS: 1 - Symptomatic but completely ambulatory  LABORATORIES:   Chemistry      Component Value Date/Time   NA 143 02/11/2014 0936   NA 140 10/14/2013 0930   K  4.1 02/11/2014 0936   K 4.7 10/14/2013 0930   CL 105 10/14/2013 0930   CO2 28 02/11/2014 0936   CO2 29 10/14/2013 0930   BUN 13.2 02/11/2014 0936   BUN 12 10/14/2013 0930   CREATININE 0.7 02/11/2014 0936   CREATININE 0.71 10/14/2013 0930      Component Value Date/Time   CALCIUM 9.4 02/11/2014 0936   CALCIUM 9.6 10/14/2013 0930   ALKPHOS 52 02/11/2014 0936   ALKPHOS 70 10/14/2013 0930   AST 26 02/11/2014 0936   AST 16 10/14/2013 0930   ALT 38 02/11/2014 0936   ALT 18 10/14/2013 0930   BILITOT 0.49 02/11/2014 0936   BILITOT 0.3 10/14/2013 0930      Lab Results  Component Value Date   WBC 6.1 02/18/2014   HGB 9.4* 02/18/2014   HCT 28.4* 02/18/2014   MCV 96.6 02/18/2014   PLT 162 02/18/2014    STUDIES/IMAGING: 1.  2-D echocardiogram on 10/11/2013 showed a left ventricular ejection fraction of 60% - 65%.  ASSESSMENT: DI JASMER is a 53 y.o. female diagnosed with triple negative invasive ductal carcinoma of the right breast in 09/2013:  #1 Intermediate grade invasive ductal carcinoma of the right breast. The mass measures 3.5 cm by ultrasound. Prognostic markers revealed the tumor to be estrogen receptor negative, progesterone receptor negative, HER-2/neu negative, with an elevated proliferation  marker Ki-67 of 80%.   #2  Bilateral breast MRI on 10/11/2013 showed within the right breast there was an irregularly marginated enhancing mass with central necrosis located in the upper-inner quadrant at the 2 o'clock position (the middle 1/3).  The mass measures 3.9 x 3.7 x 3.1 cm in size and was associated with clip artifact.  There were no additional areas of worrisome enhancement within the right breast.  The left breast did not have mass or abnormal enhancement.  No abnormal appearing lymph nodes (clinical stage IIA T2 N0).    #3  Patient started neoadjuvant chemotherapy of dose dense Adriamycin/Cytoxan with 4 planned doses on 10/27/2013.  Day 2 granulocyte support with [name] injection.  This was followed by neoadjuvant chemotherapy consisting of Taxol/carbo weekly x 12 weeks which started on 12/29/14.  #4  If patient does successfully undergo lumpectomy she will need radiation therapy.  #5 Patient has a family history of breast cancer and herself has triple negative breast cancer and underwent genetic counseling and testing on 01/06/14, results are pending.   PLAN:   #1 Mrs. Minier is doing well today. Her WBC is improved since receiving Neupogen.  I reviewed her labs with her in detail.  She will proceed with week 8 of chemotherapy.    #2 Mrs. Souder will return next week for labs, evaluation, and week nine of Taxol Carbo.     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  We will continue this dose for now.    All questions answered.  Mrs. Blixt was encouraged to contact [facility] the interim with any questions, concerns, or problems.  I spent 25 minutes counseling the patient face to face.  The total time spent in the appointment was 30 minutes.  LMinette [place]land [phone]/20/2015  2:16 PM

## 2014-02-18 NOTE — Telephone Encounter (Signed)
, °

## 2014-02-18 NOTE — Patient Instructions (Signed)
[facility] Discharge Instructions for Patients Receiving Chemotherapy  Today you received the following chemotherapy agents taxol and carboplatin.  To help prevent nausea and vomiting after your treatment, we encourage you to take your nausea medication zofran.   If you develop nausea and vomiting that is not controlled by your nausea medication, call the clinic.   BELOW ARE SYMPTOMS THAT SHOULD BE REPORTED IMMEDIATELY:  *FEVER GREATER THAN 100.5 F  *CHILLS WITH OR WITHOUT FEVER  NAUSEA AND VOMITING THAT IS NOT CONTROLLED WITH YOUR NAUSEA MEDICATION  *UNUSUAL SHORTNESS OF BREATH  *UNUSUAL BRUISING OR BLEEDING  TENDERNESS IN MOUTH AND THROAT WITH OR WITHOUT PRESENCE OF ULCERS  *URINARY PROBLEMS  *BOWEL PROBLEMS  UNUSUAL RASH Items with * indicate a potential emergency and should be followed up as soon as possible.  Feel free to call the clinic you have any questions or concerns. The clinic phone number is [phone].    

## 2014-02-21 ENCOUNTER — Other Ambulatory Visit: Payer: Self-pay | Admitting: *Deleted

## 2014-02-21 DIAGNOSIS — C50311 Malignant neoplasm of lower-inner quadrant of right female breast: Secondary | ICD-10-CM

## 2014-02-21 MED ORDER — DEXAMETHASONE 4 MG PO TABS: 8.0000 mg | ORAL_TABLET | Freq: Two times a day (BID) | ORAL | Status: DC

## 2014-02-25 ENCOUNTER — Ambulatory Visit (HOSPITAL_BASED_OUTPATIENT_CLINIC_OR_DEPARTMENT_OTHER): Payer: BC Managed Care – PPO | Admitting: Adult Health

## 2014-02-25 ENCOUNTER — Telehealth: Payer: Self-pay | Admitting: Oncology

## 2014-02-25 ENCOUNTER — Other Ambulatory Visit (HOSPITAL_BASED_OUTPATIENT_CLINIC_OR_DEPARTMENT_OTHER): Payer: BC Managed Care – PPO

## 2014-02-25 ENCOUNTER — Ambulatory Visit (HOSPITAL_BASED_OUTPATIENT_CLINIC_OR_DEPARTMENT_OTHER): Payer: BC Managed Care – PPO

## 2014-02-25 VITALS — BP 111/75 | HR 120 | Temp 99.2°F | Resp 18 | Ht 66.0 in | Wt 208.8 lb

## 2014-02-25 VITALS — BP 120/76 | HR 96 | Resp 18

## 2014-02-25 DIAGNOSIS — J329 Chronic sinusitis, unspecified: Secondary | ICD-10-CM

## 2014-02-25 DIAGNOSIS — Z171 Estrogen receptor negative status [ER-]: Secondary | ICD-10-CM

## 2014-02-25 DIAGNOSIS — Z803 Family history of malignant neoplasm of breast: Secondary | ICD-10-CM

## 2014-02-25 DIAGNOSIS — Z5111 Encounter for antineoplastic chemotherapy: Secondary | ICD-10-CM

## 2014-02-25 DIAGNOSIS — C50311 Malignant neoplasm of lower-inner quadrant of right female breast: Secondary | ICD-10-CM

## 2014-02-25 DIAGNOSIS — C50219 Malignant neoplasm of upper-inner quadrant of unspecified female breast: Secondary | ICD-10-CM

## 2014-02-25 DIAGNOSIS — D649 Anemia, unspecified: Secondary | ICD-10-CM

## 2014-02-25 DIAGNOSIS — G589 Mononeuropathy, unspecified: Secondary | ICD-10-CM

## 2014-02-25 LAB — COMPREHENSIVE METABOLIC PANEL (CC13)
ALT: 35 U/L (ref 0–55)
AST: 21 U/L (ref 5–34)
Albumin: 3.5 g/dL (ref 3.5–5.0)
Alkaline Phosphatase: 63 U/L (ref 40–150)
Anion Gap: 7 mEq/L (ref 3–11)
BILIRUBIN TOTAL: 0.46 mg/dL (ref 0.20–1.20)
BUN: 11.3 mg/dL (ref 7.0–26.0)
CO2: 27 mEq/L (ref 22–29)
CREATININE: 0.7 mg/dL (ref 0.6–1.1)
Calcium: 9 mg/dL (ref 8.4–10.4)
Chloride: 107 mEq/L (ref 98–109)
Glucose: 143 mg/dl — ABNORMAL HIGH (ref 70–140)
Potassium: 4 mEq/L (ref 3.5–5.1)
Sodium: 142 mEq/L (ref 136–145)
Total Protein: 5.9 g/dL — ABNORMAL LOW (ref 6.4–8.3)

## 2014-02-25 LAB — CBC WITH DIFFERENTIAL/PLATELET
BASO%: 0.7 % (ref 0.0–2.0)
Basophils Absolute: 0 10*3/uL (ref 0.0–0.1)
EOS%: 0.7 % (ref 0.0–7.0)
Eosinophils Absolute: 0 10*3/uL (ref 0.0–0.5)
HCT: 24.8 % — ABNORMAL LOW (ref 34.8–46.6)
HEMOGLOBIN: 8.1 g/dL — AB (ref 11.6–15.9)
LYMPH%: 44.7 % (ref 14.0–49.7)
MCH: 30.7 pg (ref 25.1–34.0)
MCHC: 32.7 g/dL (ref 31.5–36.0)
MCV: 93.9 fL (ref 79.5–101.0)
MONO#: 0.1 10*3/uL (ref 0.1–0.9)
MONO%: 4 % (ref 0.0–14.0)
NEUT#: 1.5 10*3/uL (ref 1.5–6.5)
NEUT%: 49.9 % (ref 38.4–76.8)
PLATELETS: 184 10*3/uL (ref 145–400)
RBC: 2.64 10*6/uL — ABNORMAL LOW (ref 3.70–5.45)
RDW: 16.6 % — AB (ref 11.2–14.5)
WBC: 3 10*3/uL — ABNORMAL LOW (ref 3.9–10.3)
lymph#: 1.3 10*3/uL (ref 0.9–3.3)

## 2014-02-25 MED ORDER — DEXAMETHASONE SODIUM PHOSPHATE 20 MG/5ML IJ SOLN
INTRAMUSCULAR | Status: AC
  Filled 2014-02-25: qty 5

## 2014-02-25 MED ORDER — ONDANSETRON 16 MG/50ML IVPB (CHCC)
16.0000 mg | Freq: Once | INTRAVENOUS | Status: AC
  Administered 2014-02-25: 16 mg via INTRAVENOUS

## 2014-02-25 MED ORDER — FAMOTIDINE IN NACL 20-0.9 MG/50ML-% IV SOLN
INTRAVENOUS | Status: AC
  Filled 2014-02-25: qty 50

## 2014-02-25 MED ORDER — SODIUM CHLORIDE 0.9 % IV SOLN
80.0000 mg/m2 | Freq: Once | INTRAVENOUS | Status: AC
  Administered 2014-02-25: 162 mg via INTRAVENOUS
  Filled 2014-02-25: qty 27

## 2014-02-25 MED ORDER — SODIUM CHLORIDE 0.9 % IV SOLN
Freq: Once | INTRAVENOUS | Status: AC
  Administered 2014-02-25: 15:00:00 via INTRAVENOUS

## 2014-02-25 MED ORDER — ONDANSETRON 16 MG/50ML IVPB (CHCC)
INTRAVENOUS | Status: AC
  Filled 2014-02-25: qty 16

## 2014-02-25 MED ORDER — FAMOTIDINE IN NACL 20-0.9 MG/50ML-% IV SOLN
20.0000 mg | Freq: Once | INTRAVENOUS | Status: AC
  Administered 2014-02-25: 20 mg via INTRAVENOUS

## 2014-02-25 MED ORDER — MOXIFLOXACIN HCL 400 MG PO TABS: 400.0000 mg | ORAL_TABLET | Freq: Every day | ORAL | Status: DC

## 2014-02-25 MED ORDER — SODIUM CHLORIDE 0.9 % IJ SOLN
10.0000 mL | INTRAMUSCULAR | Status: DC | PRN
  Administered 2014-02-25: 10 mL
  Filled 2014-02-25: qty 10

## 2014-02-25 MED ORDER — DEXAMETHASONE SODIUM PHOSPHATE 20 MG/5ML IJ SOLN
20.0000 mg | Freq: Once | INTRAMUSCULAR | Status: AC
  Administered 2014-02-25: 20 mg via INTRAVENOUS

## 2014-02-25 MED ORDER — DIPHENHYDRAMINE HCL 50 MG/ML IJ SOLN
INTRAMUSCULAR | Status: AC
  Filled 2014-02-25: qty 1

## 2014-02-25 MED ORDER — HEPARIN SOD (PORK) LOCK FLUSH 100 UNIT/ML IV SOLN
500.0000 [IU] | Freq: Once | INTRAVENOUS | Status: AC | PRN
  Administered 2014-02-25: 500 [IU]
  Filled 2014-02-25: qty 5

## 2014-02-25 MED ORDER — DIPHENHYDRAMINE HCL 50 MG/ML IJ SOLN
50.0000 mg | Freq: Once | INTRAMUSCULAR | Status: AC
  Administered 2014-02-25: 50 mg via INTRAVENOUS

## 2014-02-25 MED ORDER — SODIUM CHLORIDE 0.9 % IV SOLN
300.0000 mg | Freq: Once | INTRAVENOUS | Status: AC
  Administered 2014-02-25: 300 mg via INTRAVENOUS
  Filled 2014-02-25: qty 30

## 2014-02-25 NOTE — Patient Instructions (Signed)
[facility] Discharge Instructions for Patients Receiving Chemotherapy  Today you received the following chemotherapy agents: taxol, carboplatin  To help prevent nausea and vomiting after your treatment, we encourage you to take your nausea medication.  Take it as often as prescribed.     If you develop nausea and vomiting that is not controlled by your nausea medication, call the clinic. If it is after clinic hours your family physician or the after hours number for the clinic or go to the Emergency Department.   BELOW ARE SYMPTOMS THAT SHOULD BE REPORTED IMMEDIATELY:  *FEVER GREATER THAN 100.5 F  *CHILLS WITH OR WITHOUT FEVER  NAUSEA AND VOMITING THAT IS NOT CONTROLLED WITH YOUR NAUSEA MEDICATION  *UNUSUAL SHORTNESS OF BREATH  *UNUSUAL BRUISING OR BLEEDING  TENDERNESS IN MOUTH AND THROAT WITH OR WITHOUT PRESENCE OF ULCERS  *URINARY PROBLEMS  *BOWEL PROBLEMS  UNUSUAL RASH Items with * indicate a potential emergency and should be followed up as soon as possible.  Feel free to call the clinic you have any questions or concerns. The clinic phone number is [phone].   I have been informed and understand all the instructions given to me. I know to contact the clinic, my physician, or go to the Emergency Department if any problems should occur. I do not have any questions at this time, but understand that I may call the clinic during office hours   should I have any questions or need assistance in obtaining follow up care.    __________________________________________  _____________  __________ Signature of Patient or Authorized Representative            Date                   Time    __________________________________________ Nurse's Signature    

## 2014-02-25 NOTE — Patient Instructions (Addendum)
Anemia, Nonspecific Anemia is a condition in which the concentration of red blood cells or hemoglobin in the blood is below normal. Hemoglobin is a substance in red blood cells that carries oxygen to the tissues of the body. Anemia results in not enough oxygen reaching these tissues.  CAUSES  Common causes of anemia include:   Excessive bleeding. Bleeding may be internal or external. This includes excessive bleeding from periods (in women) or from the intestine.   Poor nutrition.   Chronic kidney, thyroid, and liver disease.  Bone marrow disorders that decrease red blood cell production.  Cancer and treatments for cancer.  HIV, AIDS, and their treatments.  Spleen problems that increase red blood cell destruction.  Blood disorders.  Excess destruction of red blood cells due to infection, medicines, and autoimmune disorders. SIGNS AND SYMPTOMS   Minor weakness.   Dizziness.   Headache.  Palpitations.   Shortness of breath, especially with exercise.   Paleness.  Cold sensitivity.  Indigestion.  Nausea.  Difficulty sleeping.  Difficulty concentrating. Symptoms may occur suddenly or they may develop slowly.  DIAGNOSIS  Additional blood tests are often needed. These help your health care provider determine the best treatment. Your health care provider will check your stool for blood and look for other causes of blood loss.  TREATMENT  Treatment varies depending on the cause of the anemia. Treatment can include:   Supplements of iron, vitamin M01, or folic acid.   Hormone medicines.   A blood transfusion. This may be needed if blood loss is severe.   Hospitalization. This may be needed if there is significant continual blood loss.   Dietary changes.  Spleen removal. HOME CARE INSTRUCTIONS Keep all follow-up appointments. It often takes many weeks to correct anemia, and having your health care provider check on your condition and your response to  treatment is very important. SEEK IMMEDIATE MEDICAL CARE IF:   You develop extreme weakness, shortness of breath, or chest pain.   You become dizzy or have trouble concentrating.  You develop heavy vaginal bleeding.   You develop a rash.   You have bloody or black, tarry stools.   You faint.   You vomit up blood.   You vomit repeatedly.   You have abdominal pain.  You have a fever or persistent symptoms for more than 2 3 days.   You have a fever and your symptoms suddenly get worse.   You are dehydrated.  MAKE SURE YOU:  Understand these instructions.  Will watch your condition.  Will get help right away if you are not doing well or get worse. Document Released: 01/23/2005 Document Revised: 08/18/2013 Document Reviewed: 06/11/2013 [facility] Patient Information 2014 [place]. Moxifloxacin tablets What is this medicine? MOXIFLOXACIN (mox i FLOX a sin) is a quinolone antibiotic. It is used to treat certain kinds of bacterial infections. It will not work for colds, flu, or other viral infections. This medicine may be used for other purposes; ask your health care provider or pharmacist if you have questions. COMMON BRAND NAME(S): Avelox ABC Pack, Avelox What should I tell my health care provider before I take this medicine? They need to know if you have any of these conditions: -bone problems -cerebral disease -joint problems -irregular heartbeat -liver disease -myasthenia gravis -seizure disorder -tendon problems -an unusual or allergic reaction to moxifloxacin, other quinolone antibiotics, other medicines, foods, dyes, or preservatives -pregnant or try to get pregnant -breast-feeding How should I use this medicine? Take this medicine by mouth  with a glass of water. It can be taken with or without food. Follow the directions on the prescription label. Take your medicine at regular intervals. Do not take your medicine more often than directed. Take all  of your medicine as directed even if you think your are better. Do not skip doses or stop your medicine early. A special MedGuide will be given to you by the pharmacist with each prescription and refill. Be sure to read this information carefully each time. Talk to your pediatrician regarding the use of this medicine in children. Special care may be needed. Overdosage: If you think you have taken too much of this medicine contact a poison control center or emergency room at once. NOTE: This medicine is only for you. Do not share this medicine with others. What if I miss a dose? If you miss a dose, take it as soon as you can. If it is almost time for your next dose, take only that dose. Do not take double or extra doses. What may interact with this medicine? Do not take this medicine with any of the following medications: -arsenic trioxide -chloroquine -cisapride -droperidol -halofantrine -pentamidine -phenothiazines like chlorpromazine, mesoridazine, prochlorperazine, thioridazine -pimozide -some medications for irregular heart rhythm like amiodarone, disopyramide, dofetilide, flecainide, ibutilide, quinidine, procainamide, sotalol -ziprasidone This medicine may also interact with the following medications: -antacids -didanosine (ddI) buffered tablets or powder -erythromycin -medicines for inflammation like ibuprofen, naproxen -vitamins with iron or zinc -medicines for depression, anxiety, or psychotic disturbances -sucralfate -warfarin This list may not describe all possible interactions. Give your health care provider a list of all the medicines, herbs, non-prescription drugs, or dietary supplements you use. Also tell them if you smoke, drink alcohol, or use illegal drugs. Some items may interact with your medicine. What should I watch for while using this medicine? Tell your doctor or health care professional if your symptoms do not improve. Do not treat diarrhea with over the  counter products. Contact your doctor if you have diarrhea that lasts more than 2 days or if it is severe and watery. If you have diabetes, monitor your blood glucose carefully while on this medicine. You may get drowsy or dizzy. Do not drive, use machinery, or do anything that needs mental alertness until you know how this medicine affects you. Do not stand or sit up quickly, especially if you are an older patient. This reduces the risk of dizzy or fainting spells. This medicine can make you more sensitive to the sun. Keep out of the sun. If you cannot avoid being in the sun, wear protective clothing and use sunscreen. Do not use sun lamps or tanning beds/booths. Avoid antacids, aluminum, calcium, iron, magnesium, and zinc products for 4 hours before and 8 hours after taking a dose of this medicine. What side effects may I notice from receiving this medicine? Side effects that you should report to your doctor or health care professional as soon as possible: -allergic reactions like skin rash or hives, swelling of the face, lips, or tongue -confusion, nightmares or hallucinations -difficulty breathing -irregular heartbeat or feeling faint -joint, muscle or tendon pain or swelling -pain or difficulty passing urine -persistent headache with or without blurred vision -redness, blistering, peeling or loosening of the skin, including inside the mouth -seizures -unusual pain, numbness, tingling, or weakness Side effects that usually do not require medical attention (report to your doctor or health care professional if they continue or are bothersome): -diarrhea -dry mouth -headache -nausea or stomach upset -  trouble sleeping This list may not describe all possible side effects. Call your doctor for medical advice about side effects. You may report side effects to FDA at 1-800-FDA-1088. Where should I keep my medicine? Keep out of the reach of children. Store at room temperature between 15 to 30  degrees C (59 to 86 degrees F). Do not store in a humid place. Throw away any unused medicine after the expiration date. NOTE: This sheet is a summary. It may not cover all possible information. If you have questions about this medicine, talk to your doctor, pharmacist, or health care provider.  2014, [employer] Standard. (2012-01-03 12:59:09)

## 2014-02-25 NOTE — Telephone Encounter (Signed)
, °

## 2014-02-26 ENCOUNTER — Ambulatory Visit (HOSPITAL_BASED_OUTPATIENT_CLINIC_OR_DEPARTMENT_OTHER): Payer: BC Managed Care – PPO

## 2014-02-26 VITALS — BP 130/75 | HR 120 | Temp 97.7°F | Resp 18

## 2014-02-26 DIAGNOSIS — Z5189 Encounter for other specified aftercare: Secondary | ICD-10-CM

## 2014-02-26 DIAGNOSIS — C50219 Malignant neoplasm of upper-inner quadrant of unspecified female breast: Secondary | ICD-10-CM

## 2014-02-26 MED ORDER — TBO-FILGRASTIM 480 MCG/0.8ML ~~LOC~~ SOSY
480.0000 ug | PREFILLED_SYRINGE | Freq: Once | SUBCUTANEOUS | Status: AC
  Administered 2014-02-26: 480 ug via SUBCUTANEOUS

## 2014-02-26 NOTE — Patient Instructions (Signed)
[facility] Discharge Instructions for Patients Receiving Chemotherapy  Yesterday you received the following chemotherapy agents Taxol and Carboplatin  To help prevent nausea and vomiting after your treatment, we encourage you to take your nausea medication    If you develop nausea and vomiting that is not controlled by your nausea medication, call the clinic.   BELOW ARE SYMPTOMS THAT SHOULD BE REPORTED IMMEDIATELY:  *FEVER GREATER THAN 100.5 F  *CHILLS WITH OR WITHOUT FEVER  NAUSEA AND VOMITING THAT IS NOT CONTROLLED WITH YOUR NAUSEA MEDICATION  *UNUSUAL SHORTNESS OF BREATH  *UNUSUAL BRUISING OR BLEEDING  TENDERNESS IN MOUTH AND THROAT WITH OR WITHOUT PRESENCE OF ULCERS  *URINARY PROBLEMS  *BOWEL PROBLEMS  UNUSUAL RASH Items with * indicate a potential emergency and should be followed up as soon as possible.  Feel free to call the clinic you have any questions or concerns. The clinic phone number is [phone].

## 2014-02-27 ENCOUNTER — Encounter: Payer: Self-pay | Admitting: Adult Health

## 2014-02-27 NOTE — Progress Notes (Signed)
Natalie Arias  [phone]) [phone] Fax:(336) [phone]  OFFICE PROGRESS NOTE   ID: Natalie Arias   DOB: 1961/03/05  MR#: [id number]  BCW#:[id number]   PCP: [name], PA-C SU: Fanny Skates, MD RAD ONC:  Kyung Rudd, MD   DIAGNOSES: Natalie Arias is a 53 y.o. 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   STAGE:  Breast cancer of lower-inner quadrant of right female breast   Primary site: Breast (Right)   Staging method: AJCC 7th Edition   Clinical: Stage IIA (T2, N0, cM0)   Summary: Stage IIA (T2, N0, cM0)   PRIOR THERAPY: #1 Right breast needle core biopsy performed on 09/30/2013 resulting in pathology that revealed an invasive mammary carcinoma likely ductal phenotype, intermediate grade, with a tumor that is estrogen receptor negative, progesterone receptor negative, HER-2/neu negative, with a Ki-67 80%.  #2 Bilateral breast MRI on 10/11/2013 showed within the right breast there was an irregularly marginated enhancing mass with central necrosis located in the upper-inner quadrant at the 2 o'clock position (the middle 1/3).  The mass measures 3.9 x 3.7 x 3.1 cm in size and was associated with clip artifact.  There were no additional areas of worrisome enhancement within the right breast.  The left breast did not have mass or abnormal enhancement.  No abnormal appearing lymph nodes (clinical stage IIA T2 N0).  #3 Patient was recommended neoadjuvant chemotherapy utilizing Adriamycin/Cytoxan x 4 cycles with neulasta support followed by Taxol/Carbo weekly x 12 weeks.      #4 Genetic testing on 01/06/14, results were negative.    CURRENT THERAPY: Taxol/Carbo week nine  INTERVAL HISTORY: Natalie Arias was seen today for evaluation prior to receiving her ninth cycle of Taxol Carbo.  She continues to do well and tolerate her therapy  relatively well.  She cotninues to take Gabapentin TID and her numbness is stable.  She denies fevers, chills, nausea, vomiting, constipation, diarrhea, mucositis or any other problems.    PAST MEDICAL HISTORY: Past Medical History  Diagnosis Date  . Asthma 20's    allergic asthema  . Seasonal allergies   . PONV (postoperative nausea and vomiting)   . Breast cancer dx'd 10/01/2013    right; triplen negative    PAST SURGICAL HISTORY: Past Surgical History  Procedure Laterality Date  . Laparoscopic endometriosis fulguration  1991  . Abdominal hysterectomy  1992  . Portacath placement N/A 10/18/2013    Procedure: INSERTION PORT-A-CATH WITH ULTRASOUND;  Surgeon: Adin Hector, MD;  Location: [facility];  Service: General;  Laterality: N/A;    FAMILY HISTORY: Family History  Problem Relation Age of Onset  . Lung cancer Maternal Uncle     smoker  . Breast cancer Paternal Aunt     dx in her late 25s; maternal half sister to patient's father  . Breast cancer Maternal Grandmother 30  . Lung cancer Maternal Uncle     smoker  . Hypertension Father   . Heart disease Father   . Parkinsonism Father   . Skin cancer Father   . Hypercholesterolemia Mother   . Gallstones Mother   . Healthy Brother   . COPD Maternal Grandfather   . Lung cancer Cousin     smoker; paternal cousin    SOCIAL HISTORY: History  Substance Use Topics  . Smoking status: Never Smoker   . Smokeless tobacco: Never Used  .  Alcohol Use: No     Comment: once a month    ALLERGIES: Allergies  Allergen Reactions  . Penicillins Hives    CURRENT MEDICATIONS: Current Outpatient Prescriptions  Medication Sig Dispense Refill  . B Complex-C (SUPER B COMPLEX PO) Take 1 tablet by mouth.      . gabapentin (NEURONTIN) 100 MG capsule Take 1 capsule (100 mg total) by mouth 3 (three) times daily.  90 capsule  1  . hydrocortisone (ANUSOL-HC) 2.5 % rectal cream Place 1 application rectally 2 (two) times daily.  30 g  0  .  ibuprofen (ADVIL,MOTRIN) 200 MG tablet Take 200 mg by mouth every 6 (six) hours as needed for pain.      Marland Kitchen lidocaine-prilocaine (EMLA) cream Apply topically as needed. To port-a-cath 1.5 hours before chemotherapy and cover  30 g  1  . polyethylene glycol (MIRALAX / GLYCOLAX) packet Take 17 g by mouth daily as needed.      . SENNA PO Take by mouth.      Marland Kitchen UNABLE TO FIND 1 each by Other route as needed. Cranial Prosthesis      . dexamethasone (DECADRON) 4 MG tablet Take 2 tablets (8 mg total) by mouth 2 (two) times daily with a meal. Take two times a day starting the day after chemotherapy for 3 days.  30 tablet  2  . LORazepam (ATIVAN) 0.5 MG tablet Take 1 tablet (0.5 mg total) by mouth every 6 (six) hours as needed (Nausea or vomiting).  30 tablet  0  . moxifloxacin (AVELOX) 400 MG tablet Take 1 tablet (400 mg total) by mouth daily at 8 pm.  14 tablet  0  . ondansetron (ZOFRAN) 8 MG tablet Take 1 tablet (8 mg total) by mouth 2 (two) times daily. Take two times a day starting the day after chemo for 3 days. Then take two times a day as needed for nausea or vomiting.  30 tablet  1  . prochlorperazine (COMPAZINE) 10 MG tablet Take 1 tablet (10 mg total) by mouth every 6 (six) hours as needed (Nausea or vomiting).  30 tablet  1   No current facility-administered medications for this visit.   REVIEW OF SYSTEMS:  A 10 point review of systems was conducted and is otherwise negative except for what is noted above.    PHYSICAL EXAMINATION: Blood pressure 111/75, pulse 120, temperature 99.2 F (37.3 C), temperature source Oral, resp. rate 18, height $RemoveBe'5\' 6"'nhtuwkSLW$  (1.676 m), weight 208 lb 12.8 oz (94.711 kg). GENERAL: Patient is a well appearing female in no acute distress HEENT:  Sclerae anicteric.  Oropharynx clear and moist. No ulcerations or evidence of oropharyngeal candidiasis. Neck is supple.  NODES:  No cervical, supraclavicular, or axillary lymphadenopathy palpated.  BREAST EXAM:  Small mass palpated in  the upper inner quadrant of the right breast LUNGS:  Clear to auscultation bilaterally.  No wheezes or rhonchi. HEART:  Regular rate and rhythm. No murmur appreciated. ABDOMEN:  Soft, nontender.  Positive, normoactive bowel sounds. No organomegaly palpated. MSK:  No focal spinal tenderness to palpation. Full range of motion bilaterally in the upper extremities. EXTREMITIES:  No peripheral edema.   SKIN:  Clear with no obvious rashes or skin changes. No nail dyscrasia. NEURO:  Nonfocal. Well oriented.  Appropriate affect. ECOG FS: 1 - Symptomatic but completely ambulatory  LABORATORIES:   Chemistry      Component Value Date/Time   NA 142 02/25/2014 1332   NA 140 10/14/2013 0930  K 4.0 02/25/2014 1332   K 4.7 10/14/2013 0930   CL 105 10/14/2013 0930   CO2 27 02/25/2014 1332   CO2 29 10/14/2013 0930   BUN 11.3 02/25/2014 1332   BUN 12 10/14/2013 0930   CREATININE 0.7 02/25/2014 1332   CREATININE 0.71 10/14/2013 0930      Component Value Date/Time   CALCIUM 9.0 02/25/2014 1332   CALCIUM 9.6 10/14/2013 0930   ALKPHOS 63 02/25/2014 1332   ALKPHOS 70 10/14/2013 0930   AST 21 02/25/2014 1332   AST 16 10/14/2013 0930   ALT 35 02/25/2014 1332   ALT 18 10/14/2013 0930   BILITOT 0.46 02/25/2014 1332   BILITOT 0.3 10/14/2013 0930      Lab Results  Component Value Date   WBC 3.0* 02/25/2014   HGB 8.1* 02/25/2014   HCT 24.8* 02/25/2014   MCV 93.9 02/25/2014   PLT 184 02/25/2014    STUDIES/IMAGING: 1.  2-D echocardiogram on 10/11/2013 showed a left ventricular ejection fraction of 60% - 65%.  ASSESSMENT: Natalie Arias is a 53 y.o. female diagnosed with triple negative invasive ductal carcinoma of the right breast in 09/2013:  #1 Intermediate grade invasive ductal carcinoma of the right breast. The mass measures 3.5 cm by ultrasound. Prognostic markers revealed the tumor to be estrogen receptor negative, progesterone receptor negative, HER-2/neu negative, with an elevated proliferation  marker Ki-67 of 80%.   #2  Bilateral breast MRI on 10/11/2013 showed within the right breast there was an irregularly marginated enhancing mass with central necrosis located in the upper-inner quadrant at the 2 o'clock position (the middle 1/3).  The mass measures 3.9 x 3.7 x 3.1 cm in size and was associated with clip artifact.  There were no additional areas of worrisome enhancement within the right breast.  The left breast did not have mass or abnormal enhancement.  No abnormal appearing lymph nodes (clinical stage IIA T2 N0).    #3  Patient started neoadjuvant chemotherapy of dose dense Adriamycin/Cytoxan with 4 planned doses on 10/27/2013.  Day 2 granulocyte support with [name] injection.  This was followed by neoadjuvant chemotherapy consisting of Taxol/carbo weekly x 12 weeks which started on 12/29/14.  #4  If patient does successfully undergo lumpectomy she will need radiation therapy.  #5 Patient has a family history of breast cancer and herself has triple negative breast cancer and underwent genetic counseling and testing on 01/06/14, results were negative.   PLAN:   #1 Natalie Arias is doing well today. Her ANC is 1.5 today.  She will receive Neupogen x 4 days.  She is anemic today.  I have given her written instructions on anemia.  She is asymptomatic.  Should she begin to have any symptoms, she will need a transfusion, and is agreeable to this.     #2 Natalie Arias will return next week for labs, evaluation, and week ten of Taxol Carbo.     #3 Her neuropathy is improved with Gabapentin $RemoveBeforeD'100mg'[id number]$  TID.  We will continue this dose for now.    #4 She has MRI of the breasts scheduled on 03/01/14, her f/u with Dr. Dalbert Batman is scheduled in the beginning of April, for surgical planning, however she finishes chemotherapy on 03/18/14.  We will try to get this moved up.    All questions answered.  Natalie Arias was encouraged to contact us in the interim with any questions, concerns, or problems.  I spent  25 minutes counseling the patient face to face.  The  total time spent in the appointment was 30 minutes.  Minette Headland, [place] [phone] 02/27/2014  12:06 PM

## 2014-02-28 ENCOUNTER — Ambulatory Visit (HOSPITAL_BASED_OUTPATIENT_CLINIC_OR_DEPARTMENT_OTHER): Payer: BC Managed Care – PPO

## 2014-02-28 VITALS — BP 136/74 | HR 116 | Temp 98.8°F

## 2014-02-28 DIAGNOSIS — C50311 Malignant neoplasm of lower-inner quadrant of right female breast: Secondary | ICD-10-CM

## 2014-02-28 DIAGNOSIS — Z5189 Encounter for other specified aftercare: Secondary | ICD-10-CM

## 2014-02-28 DIAGNOSIS — C50219 Malignant neoplasm of upper-inner quadrant of unspecified female breast: Secondary | ICD-10-CM

## 2014-02-28 MED ORDER — TBO-FILGRASTIM 480 MCG/0.8ML ~~LOC~~ SOSY
480.0000 ug | PREFILLED_SYRINGE | Freq: Once | SUBCUTANEOUS | Status: AC
  Administered 2014-02-28: 480 ug via SUBCUTANEOUS
  Filled 2014-02-28: qty 0.8

## 2014-02-28 NOTE — Patient Instructions (Signed)
Tbo-Filgrastim injection What is this medicine? TBO-FILGRASTIM is used to help decrease the time you have low amounts of white blood cells after cancer treatment. It helps the body make more white blood cells. Increasing the amount of white blood cells helps to decrease the risk of infection and fever. This medicine may be used for other purposes; ask your health care provider or pharmacist if you have questions. COMMON BRAND NAME(S): Granix What should I tell my health care provider before I take this medicine? They need to know if you have any of these conditions: -history of blood diseases, like sickle cell anemia or leukemia -an unusual or allergic reaction to tbo-filgrastim, filgrastim, pegfilgrastim, other medicines, foods, dyes, or preservatives -pregnant or trying to get pregnant -breast-feeding How should I use this medicine? This medicine is for injection under the skin. It is given by a health care professional in a hospital or clinic setting. Talk to your pediatrician regarding the use of this medicine in children. Special care may be needed. Overdosage: If you think you've taken too much of this medicine contact a poison control center or emergency room at once. Overdosage: If you think you have taken too much of this medicine contact a poison control center or emergency room at once. NOTE: This medicine is only for you. Do not share this medicine with others. What if I miss a dose? Keep appointments for follow-up doses as directed. It is important not to miss your dose. Call your doctor or health care professional if you are unable to keep an appointment. What may interact with this medicine? -lithium This list may not describe all possible interactions. Give your health care provider a list of all the medicines, herbs, non-prescription drugs, or dietary supplements you use. Also tell them if you smoke, drink alcohol, or use illegal drugs. Some items may interact with your  medicine. What should I watch for while using this medicine? Your condition will be monitored carefully while you are receiving this medicine. You may need blood work done while you are taking this medicine. Avoid taking products that contain aspirin, acetaminophen, ibuprofen, naproxen, or ketoprofen unless instructed by your doctor. These medicines may hide a fever. Call your doctor or health care professional for advice if you get a fever, chills or sore throat, or other symptoms of a cold or flu. Do not treat yourself. What side effects may I notice from receiving this medicine? Side effects that you should report to your doctor or health care professional as soon as possible: -allergic reactions like skin rash, itching or hives, swelling of the face, lips, or tongue -breathing problems -fever -stomach pain  Side effects that usually do not require medical attention (Report these to your doctor or health care professional if they continue or are bothersome.): -bone pain This list may not describe all possible side effects. Call your doctor for medical advice about side effects. You may report side effects to FDA at 1-800-FDA-1088. Where should I keep my medicine? This drug is given in a hospital or clinic and will not be stored at home. NOTE: This sheet is a summary. It may not cover all possible information. If you have questions about this medicine, talk to your doctor, pharmacist, or health care provider.  2014, Elsevier/Gold Standard. (2011-09-04 16:41:24)  

## 2014-03-01 ENCOUNTER — Ambulatory Visit (HOSPITAL_COMMUNITY)
Admission: RE | Admit: 2014-03-01 | Discharge: 2014-03-01 | Disposition: A | Discharge status: Home / Self Care | Payer: BC Managed Care – PPO | Source: Ambulatory Visit | Attending: Adult Health | Admitting: Adult Health

## 2014-03-01 ENCOUNTER — Ambulatory Visit (HOSPITAL_BASED_OUTPATIENT_CLINIC_OR_DEPARTMENT_OTHER): Payer: BC Managed Care – PPO

## 2014-03-01 VITALS — BP 125/71 | HR 112 | Temp 98.3°F

## 2014-03-01 DIAGNOSIS — C50219 Malignant neoplasm of upper-inner quadrant of unspecified female breast: Secondary | ICD-10-CM

## 2014-03-01 DIAGNOSIS — C50311 Malignant neoplasm of lower-inner quadrant of right female breast: Secondary | ICD-10-CM

## 2014-03-01 DIAGNOSIS — Z9221 Personal history of antineoplastic chemotherapy: Secondary | ICD-10-CM | POA: Insufficient documentation

## 2014-03-01 DIAGNOSIS — Z5189 Encounter for other specified aftercare: Secondary | ICD-10-CM

## 2014-03-01 DIAGNOSIS — C50919 Malignant neoplasm of unspecified site of unspecified female breast: Secondary | ICD-10-CM | POA: Insufficient documentation

## 2014-03-01 MED ORDER — GADOBENATE DIMEGLUMINE 529 MG/ML IV SOLN
19.0000 mL | Freq: Once | INTRAVENOUS | Status: AC | PRN
  Administered 2014-03-01: 19 mL via INTRAVENOUS

## 2014-03-01 MED ORDER — TBO-FILGRASTIM 480 MCG/0.8ML ~~LOC~~ SOSY
480.0000 ug | PREFILLED_SYRINGE | Freq: Once | SUBCUTANEOUS | Status: AC
  Administered 2014-03-01: 480 ug via SUBCUTANEOUS
  Filled 2014-03-01: qty 0.8

## 2014-03-02 ENCOUNTER — Ambulatory Visit (HOSPITAL_BASED_OUTPATIENT_CLINIC_OR_DEPARTMENT_OTHER): Payer: BC Managed Care – PPO

## 2014-03-02 VITALS — BP 137/69 | HR 116 | Temp 98.9°F

## 2014-03-02 DIAGNOSIS — C50311 Malignant neoplasm of lower-inner quadrant of right female breast: Secondary | ICD-10-CM

## 2014-03-02 DIAGNOSIS — Z5189 Encounter for other specified aftercare: Secondary | ICD-10-CM

## 2014-03-02 DIAGNOSIS — C50219 Malignant neoplasm of upper-inner quadrant of unspecified female breast: Secondary | ICD-10-CM

## 2014-03-02 MED ORDER — TBO-FILGRASTIM 480 MCG/0.8ML ~~LOC~~ SOSY
480.0000 ug | PREFILLED_SYRINGE | Freq: Once | SUBCUTANEOUS | Status: AC
  Administered 2014-03-02: 480 ug via SUBCUTANEOUS
  Filled 2014-03-02: qty 0.8

## 2014-03-04 ENCOUNTER — Ambulatory Visit (HOSPITAL_BASED_OUTPATIENT_CLINIC_OR_DEPARTMENT_OTHER): Payer: BC Managed Care – PPO | Admitting: Adult Health

## 2014-03-04 ENCOUNTER — Telehealth (INDEPENDENT_AMBULATORY_CARE_PROVIDER_SITE_OTHER): Payer: Self-pay | Admitting: *Deleted

## 2014-03-04 ENCOUNTER — Encounter: Payer: Self-pay | Admitting: Adult Health

## 2014-03-04 ENCOUNTER — Other Ambulatory Visit (HOSPITAL_BASED_OUTPATIENT_CLINIC_OR_DEPARTMENT_OTHER): Payer: BC Managed Care – PPO

## 2014-03-04 ENCOUNTER — Encounter: Payer: Self-pay | Admitting: Oncology

## 2014-03-04 ENCOUNTER — Ambulatory Visit (HOSPITAL_BASED_OUTPATIENT_CLINIC_OR_DEPARTMENT_OTHER): Payer: BC Managed Care – PPO

## 2014-03-04 VITALS — BP 118/75 | HR 139 | Temp 98.1°F | Resp 18 | Ht 66.0 in | Wt 210.3 lb

## 2014-03-04 DIAGNOSIS — Z5111 Encounter for antineoplastic chemotherapy: Secondary | ICD-10-CM

## 2014-03-04 DIAGNOSIS — Z171 Estrogen receptor negative status [ER-]: Secondary | ICD-10-CM

## 2014-03-04 DIAGNOSIS — C50219 Malignant neoplasm of upper-inner quadrant of unspecified female breast: Secondary | ICD-10-CM

## 2014-03-04 DIAGNOSIS — D649 Anemia, unspecified: Secondary | ICD-10-CM

## 2014-03-04 DIAGNOSIS — C50311 Malignant neoplasm of lower-inner quadrant of right female breast: Secondary | ICD-10-CM

## 2014-03-04 DIAGNOSIS — G609 Hereditary and idiopathic neuropathy, unspecified: Secondary | ICD-10-CM

## 2014-03-04 LAB — CBC WITH DIFFERENTIAL/PLATELET
BASO%: 0.5 % (ref 0.0–2.0)
Basophils Absolute: 0 10*3/uL (ref 0.0–0.1)
EOS ABS: 0 10*3/uL (ref 0.0–0.5)
EOS%: 0.3 % (ref 0.0–7.0)
HEMATOCRIT: 25.8 % — AB (ref 34.8–46.6)
HGB: 8.4 g/dL — ABNORMAL LOW (ref 11.6–15.9)
LYMPH%: 20.7 % (ref 14.0–49.7)
MCH: 31.5 pg (ref 25.1–34.0)
MCHC: 32.6 g/dL (ref 31.5–36.0)
MCV: 96.6 fL (ref 79.5–101.0)
MONO#: 0.6 10*3/uL (ref 0.1–0.9)
MONO%: 7.1 % (ref 0.0–14.0)
NEUT#: 6.1 10*3/uL (ref 1.5–6.5)
NEUT%: 71.4 % (ref 38.4–76.8)
PLATELETS: 198 10*3/uL (ref 145–400)
RBC: 2.67 10*6/uL — ABNORMAL LOW (ref 3.70–5.45)
RDW: 19.1 % — ABNORMAL HIGH (ref 11.2–14.5)
WBC: 8.6 10*3/uL (ref 3.9–10.3)
lymph#: 1.8 10*3/uL (ref 0.9–3.3)

## 2014-03-04 LAB — COMPREHENSIVE METABOLIC PANEL (CC13)
ALT: 34 U/L (ref 0–55)
ANION GAP: 10 meq/L (ref 3–11)
AST: 24 U/L (ref 5–34)
Albumin: 3.6 g/dL (ref 3.5–5.0)
Alkaline Phosphatase: 95 U/L (ref 40–150)
BILIRUBIN TOTAL: 0.32 mg/dL (ref 0.20–1.20)
BUN: 11.1 mg/dL (ref 7.0–26.0)
CALCIUM: 9 mg/dL (ref 8.4–10.4)
CO2: 26 meq/L (ref 22–29)
CREATININE: 0.8 mg/dL (ref 0.6–1.1)
Chloride: 108 mEq/L (ref 98–109)
GLUCOSE: 164 mg/dL — AB (ref 70–140)
Potassium: 3.7 mEq/L (ref 3.5–5.1)
Sodium: 143 mEq/L (ref 136–145)
Total Protein: 6 g/dL — ABNORMAL LOW (ref 6.4–8.3)

## 2014-03-04 MED ORDER — ONDANSETRON 16 MG/50ML IVPB (CHCC)
INTRAVENOUS | Status: AC
  Filled 2014-03-04: qty 16

## 2014-03-04 MED ORDER — FAMOTIDINE IN NACL 20-0.9 MG/50ML-% IV SOLN
INTRAVENOUS | Status: AC
  Filled 2014-03-04: qty 50

## 2014-03-04 MED ORDER — SODIUM CHLORIDE 0.9 % IV SOLN
300.0000 mg | Freq: Once | INTRAVENOUS | Status: AC
  Administered 2014-03-04: 300 mg via INTRAVENOUS
  Filled 2014-03-04: qty 30

## 2014-03-04 MED ORDER — SODIUM CHLORIDE 0.9 % IV SOLN
Freq: Once | INTRAVENOUS | Status: AC
  Administered 2014-03-04: 15:00:00 via INTRAVENOUS

## 2014-03-04 MED ORDER — ONDANSETRON 16 MG/50ML IVPB (CHCC)
16.0000 mg | Freq: Once | INTRAVENOUS | Status: AC
  Administered 2014-03-04: 16 mg via INTRAVENOUS

## 2014-03-04 MED ORDER — DIPHENHYDRAMINE HCL 50 MG/ML IJ SOLN
INTRAMUSCULAR | Status: AC
  Filled 2014-03-04: qty 1

## 2014-03-04 MED ORDER — DIPHENHYDRAMINE HCL 50 MG/ML IJ SOLN
50.0000 mg | Freq: Once | INTRAMUSCULAR | Status: AC
  Administered 2014-03-04: 50 mg via INTRAVENOUS

## 2014-03-04 MED ORDER — SODIUM CHLORIDE 0.9 % IJ SOLN
10.0000 mL | INTRAMUSCULAR | Status: DC | PRN
  Administered 2014-03-04: 10 mL
  Filled 2014-03-04: qty 10

## 2014-03-04 MED ORDER — HEPARIN SOD (PORK) LOCK FLUSH 100 UNIT/ML IV SOLN
500.0000 [IU] | Freq: Once | INTRAVENOUS | Status: AC | PRN
  Administered 2014-03-04: 500 [IU]
  Filled 2014-03-04: qty 5

## 2014-03-04 MED ORDER — DEXAMETHASONE SODIUM PHOSPHATE 20 MG/5ML IJ SOLN
INTRAMUSCULAR | Status: AC
  Filled 2014-03-04: qty 5

## 2014-03-04 MED ORDER — DEXAMETHASONE SODIUM PHOSPHATE 20 MG/5ML IJ SOLN
20.0000 mg | Freq: Once | INTRAMUSCULAR | Status: AC
  Administered 2014-03-04: 20 mg via INTRAVENOUS

## 2014-03-04 MED ORDER — FAMOTIDINE IN NACL 20-0.9 MG/50ML-% IV SOLN
20.0000 mg | Freq: Once | INTRAVENOUS | Status: AC
  Administered 2014-03-04: 20 mg via INTRAVENOUS

## 2014-03-04 MED ORDER — SODIUM CHLORIDE 0.9 % IV SOLN
80.0000 mg/m2 | Freq: Once | INTRAVENOUS | Status: AC
  Administered 2014-03-04: 162 mg via INTRAVENOUS
  Filled 2014-03-04: qty 27

## 2014-03-04 NOTE — Patient Instructions (Signed)
Increase Gabapentin to 200mg  three times per day for your numbness.

## 2014-03-04 NOTE — Telephone Encounter (Signed)
Called and left message for patient that her appt is now scheduled for 03/09/14 @ 2p arrive at 145p.

## 2014-03-04 NOTE — Progress Notes (Signed)
[name]  Telephone:(336) [phone] Fax:(336) [phone]  OFFICE PROGRESS NOTE   ID: Natalie Arias   DOB: Feb 13, 1961  MR#: [id number]  HER#:[id number]   PCP: [name], PA-C SU: Fanny Skates, MD RAD ONC:  Kyung Rudd, MD   DIAGNOSES: Natalie Arias is a 53 y.o. 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   STAGE:  Breast cancer of lower-inner quadrant of right female breast   Primary site: Breast (Right)   Staging method: AJCC 7th Edition   Clinical: Stage IIA (T2, N0, cM0)   Summary: Stage IIA (T2, N0, cM0)   PRIOR THERAPY: #1 Right breast needle core biopsy performed on 09/30/2013 resulting in pathology that revealed an invasive mammary carcinoma likely ductal phenotype, intermediate grade, with a tumor that is estrogen receptor negative, progesterone receptor negative, HER-2/neu negative, with a Ki-67 80%.  #2 Bilateral breast MRI on 10/11/2013 showed within the right breast there was an irregularly marginated enhancing mass with central necrosis located in the upper-inner quadrant at the 2 o'clock position (the middle 1/3).  The mass measures 3.9 x 3.7 x 3.1 cm in size and was associated with clip artifact.  There were no additional areas of worrisome enhancement within the right breast.  The left breast did not have mass or abnormal enhancement.  No abnormal appearing lymph nodes (clinical stage IIA T2 N0).  #3 Patient was recommended neoadjuvant chemotherapy utilizing Adriamycin/Cytoxan x 4 cycles with neulasta support followed by Taxol/Carbo weekly x 12 weeks.      #4 Genetic testing on 01/06/14, results were negative.    CURRENT THERAPY: Taxol/Carbo week ten  INTERVAL HISTORY: Natalie Arias was seen today for evaluation prior to receiving her tenth cycle of Taxol/Carbo.  She is doing well today.  She had her MRI this past  week, and also felt very fatigued this week.  She is feeling improved today.  She is requesting her MRI results.  Her numbness in her fingertips and toes is stable.  She is taking Gabapentin 170m TID for this.  She denies fevers, chills, nausea, vomiting, constipation, diarrhea,  vision changes, headaches, nail changes, or any other concerns.      PAST MEDICAL HISTORY: Past Medical History  Diagnosis Date  . Asthma 20's    allergic asthema  . Seasonal allergies   . PONV (postoperative nausea and vomiting)   . Breast cancer dx'd 10/01/2013    right; triplen negative    PAST SURGICAL HISTORY: Past Surgical History  Procedure Laterality Date  . Laparoscopic endometriosis fulguration  1991  . Abdominal hysterectomy  1992  . Portacath placement N/A 10/18/2013    Procedure: INSERTION PORT-A-CATH WITH ULTRASOUND;  Surgeon: HAdin Hector MD;  Location: [facility];  Service: General;  Laterality: N/A;    FAMILY HISTORY: Family History  Problem Relation Age of Onset  . Lung cancer Maternal Uncle     smoker  . Breast cancer Paternal Aunt     dx in her late 634s maternal half sister to patient's father  . Breast cancer Maternal Grandmother [phone] . Lung cancer Maternal Uncle     smoker  . Hypertension Father   . Heart disease Father   . Parkinsonism Father   . Skin cancer Father   . Hypercholesterolemia Mother   . Gallstones Mother   . Healthy Brother   . COPD Maternal Grandfather   .  Lung cancer Cousin     smoker; paternal cousin    SOCIAL HISTORY: History  Substance Use Topics  . Smoking status: Never Smoker   . Smokeless tobacco: Never Used  . Alcohol Use: No     Comment: once a month    ALLERGIES: Allergies  Allergen Reactions  . Penicillins Hives    CURRENT MEDICATIONS: Current Outpatient Prescriptions  Medication Sig Dispense Refill  . B Complex-C (SUPER B COMPLEX PO) Take 1 tablet by mouth.      . gabapentin (NEURONTIN) 100 MG capsule Take 1 capsule (100 mg  total) by mouth 3 (three) times daily.  90 capsule  1  . ibuprofen (ADVIL,MOTRIN) 200 MG tablet Take 200 mg by mouth every 6 (six) hours as needed for pain.      Marland Kitchen lidocaine-prilocaine (EMLA) cream Apply topically as needed. To port-a-cath 1.5 hours before chemotherapy and cover  30 g  1  . moxifloxacin (AVELOX) 400 MG tablet Take 1 tablet (400 mg total) by mouth daily at 8 pm.  14 tablet  0  . SENNA PO Take by mouth.      Marland Kitchen UNABLE TO FIND 1 each by Other route as needed. Cranial Prosthesis      . dexamethasone (DECADRON) 4 MG tablet Take 2 tablets (8 mg total) by mouth 2 (two) times daily with a meal. Take two times a day starting the day after chemotherapy for 3 days.  30 tablet  2  . hydrocortisone (ANUSOL-HC) 2.5 % rectal cream Place 1 application rectally 2 (two) times daily.  30 g  0  . LORazepam (ATIVAN) 0.5 MG tablet Take 1 tablet (0.5 mg total) by mouth every 6 (six) hours as needed (Nausea or vomiting).  30 tablet  0  . ondansetron (ZOFRAN) 8 MG tablet Take 1 tablet (8 mg total) by mouth 2 (two) times daily. Take two times a day starting the day after chemo for 3 days. Then take two times a day as needed for nausea or vomiting.  30 tablet  1  . polyethylene glycol (MIRALAX / GLYCOLAX) packet Take 17 g by mouth daily as needed.      . prochlorperazine (COMPAZINE) 10 MG tablet Take 1 tablet (10 mg total) by mouth every 6 (six) hours as needed (Nausea or vomiting).  30 tablet  1   No current facility-administered medications for this visit.   REVIEW OF SYSTEMS:  A 10 point review of systems was conducted and is otherwise negative except for what is noted above.    PHYSICAL EXAMINATION: Blood pressure 118/75, pulse 139, temperature 98.1 F (36.7 C), temperature source Oral, resp. rate 18, height '5\' 6"'  (1.676 m), weight 210 lb 4.8 oz (95.391 kg). GENERAL: Patient is a well appearing female in no acute distress HEENT:  Sclerae anicteric.  Oropharynx clear and moist. No ulcerations or  evidence of oropharyngeal candidiasis. Neck is supple.  NODES:  No cervical, supraclavicular, or axillary lymphadenopathy palpated.  BREAST EXAM:  Small mass palpated in the upper inner quadrant of the right breast LUNGS:  Clear to auscultation bilaterally.  No wheezes or rhonchi. HEART:  Regular rate and rhythm. No murmur appreciated. ABDOMEN:  Soft, nontender.  Positive, normoactive bowel sounds. No organomegaly palpated. MSK:  No focal spinal tenderness to palpation. Full range of motion bilaterally in the upper extremities. EXTREMITIES:  No peripheral edema.   SKIN:  Clear with no obvious rashes or skin changes. No nail dyscrasia. NEURO:  Nonfocal. Well oriented.  Appropriate affect.  ECOG FS: 1 - Symptomatic but completely ambulatory  LABORATORIES:   Chemistry      Component Value Date/Time   NA 143 03/04/2014 1358   NA 140 10/14/2013 0930   K 3.7 03/04/2014 1358   K 4.7 10/14/2013 0930   CL 105 10/14/2013 0930   CO2 26 03/04/2014 1358   CO2 29 10/14/2013 0930   BUN 11.1 03/04/2014 1358   BUN 12 10/14/2013 0930   CREATININE 0.8 03/04/2014 1358   CREATININE 0.71 10/14/2013 0930      Component Value Date/Time   CALCIUM 9.0 03/04/2014 1358   CALCIUM 9.6 10/14/2013 0930   ALKPHOS 95 03/04/2014 1358   ALKPHOS 70 10/14/2013 0930   AST 24 03/04/2014 1358   AST 16 10/14/2013 0930   ALT 34 03/04/2014 1358   ALT 18 10/14/2013 0930   BILITOT 0.32 03/04/2014 1358   BILITOT 0.3 10/14/2013 0930      Lab Results  Component Value Date   WBC 8.6 03/04/2014   HGB 8.4* 03/04/2014   HCT 25.8* 03/04/2014   MCV 96.6 03/04/2014   PLT 198 03/04/2014    STUDIES/IMAGING: 1.  2-D echocardiogram on 10/11/2013 showed a left ventricular ejection fraction of 60% - 65%.  ASSESSMENT: Natalie Arias is a 53 y.o. female diagnosed with triple negative invasive ductal carcinoma of the right breast in 09/2013:  #1 Intermediate grade invasive ductal carcinoma of the right breast. The mass measures 3.5 cm by ultrasound.  Prognostic markers revealed the tumor to be estrogen receptor negative, progesterone receptor negative, HER-2/neu negative, with an elevated proliferation marker Ki-67 of 80%.   #2  Bilateral breast MRI on 10/11/2013 showed within the right breast there was an irregularly marginated enhancing mass with central necrosis located in the upper-inner quadrant at the 2 o'clock position (the middle 1/3).  The mass measures 3.9 x 3.7 x 3.1 cm in size and was associated with clip artifact.  There were no additional areas of worrisome enhancement within the right breast.  The left breast did not have mass or abnormal enhancement.  No abnormal appearing lymph nodes (clinical stage IIA T2 N0).    #3  Patient started neoadjuvant chemotherapy of dose dense Adriamycin/Cytoxan with 4 planned doses on 10/27/2013.  Day 2 granulocyte support with [name] injection.  This was followed by neoadjuvant chemotherapy consisting of Taxol/carbo weekly x 12 weeks which started on 12/29/14.  #4  If patient does successfully undergo lumpectomy she will need radiation therapy.  #5 Patient has a family history of breast cancer and herself has triple negative breast cancer and underwent genetic counseling and testing on 01/06/14, results were negative.   PLAN:   #1 Mrs. Peoples is doing well today. Her CBC is improved from last week.  I reviewed this with her in detail.  She will proceed with week 10 of Taxol/Carbo chemotherapy.  She does not need GCSF support this week.  She is still slightly anemic with a hemoglobin of 8.4.  She knows to call with any symptoms of anemia (she has a handout of these) and I will arrange for labs and possible transfusion.  I reviewed her MRI results with her in detail.  Her breast cancer has improved from 3.9x3.7x3.1cm to 3.7 x 2.2 x 2 cm.    #2 Mrs. Mcbreen will return next week for labs, evaluation, and week eleven of Taxol Carbo.     #3 I recommended she increase her Neurontin to 241m TID for her  peripheral neuropathy.    #  4 Patient has f/u with Dr. Dalbert Batman next week for surgical planning.  She cannot make this appointment, and is going to reschedule.    All questions answered.  Mrs. Staebell was encouraged to contact us in the interim with any questions, concerns, or problems.  I spent 25 minutes counseling the patient face to face.  The total time spent in the appointment was 30 minutes.  Minette Headland, [place] [phone] 03/06/2014  9:21 AM

## 2014-03-04 NOTE — Patient Instructions (Signed)
[facility] Discharge Instructions for Patients Receiving Chemotherapy  Today you received the following chemotherapy agents: taxol, carboplatin   To help prevent nausea and vomiting after your treatment, we encourage you to take your nausea medication as prescribed.    If you develop nausea and vomiting that is not controlled by your nausea medication, call the clinic.   BELOW ARE SYMPTOMS THAT SHOULD BE REPORTED IMMEDIATELY:  *FEVER GREATER THAN 100.5 F  *CHILLS WITH OR WITHOUT FEVER  NAUSEA AND VOMITING THAT IS NOT CONTROLLED WITH YOUR NAUSEA MEDICATION  *UNUSUAL SHORTNESS OF BREATH  *UNUSUAL BRUISING OR BLEEDING  TENDERNESS IN MOUTH AND THROAT WITH OR WITHOUT PRESENCE OF ULCERS  *URINARY PROBLEMS  *BOWEL PROBLEMS  UNUSUAL RASH Items with * indicate a potential emergency and should be followed up as soon as possible.  Feel free to call the clinic you have any questions or concerns. The clinic phone number is [phone].    

## 2014-03-07 ENCOUNTER — Encounter: Payer: Self-pay | Admitting: Oncology

## 2014-03-07 ENCOUNTER — Telehealth: Payer: Self-pay | Admitting: *Deleted

## 2014-03-07 NOTE — Progress Notes (Signed)
I left a message for Natalie Arias to let patient know that [employer] has not paid yet, so we can't send EOB to [name] for copay asst. Already confirmed with billing and once they pay, I can fwd for asst

## 2014-03-07 NOTE — Telephone Encounter (Signed)
Reports "seen on 03-04-2014 and instructed to monitor her pulse over the weekend for possible transfusion.  Twice my pulse has been below one hundred."  Asked for an average and reportedly "the highest pulse = 118 other times it ranged from 104 to 115".  Will notify providers.

## 2014-03-08 ENCOUNTER — Encounter (INDEPENDENT_AMBULATORY_CARE_PROVIDER_SITE_OTHER): Payer: Self-pay | Admitting: General Surgery

## 2014-03-08 ENCOUNTER — Ambulatory Visit (INDEPENDENT_AMBULATORY_CARE_PROVIDER_SITE_OTHER): Payer: BC Managed Care – PPO | Admitting: General Surgery

## 2014-03-08 VITALS — BP 130/76 | HR 77 | Temp 97.8°F | Resp 16 | Ht 66.0 in | Wt 211.0 lb

## 2014-03-08 DIAGNOSIS — C50311 Malignant neoplasm of lower-inner quadrant of right female breast: Secondary | ICD-10-CM

## 2014-03-08 DIAGNOSIS — C50319 Malignant neoplasm of lower-inner quadrant of unspecified female breast: Secondary | ICD-10-CM

## 2014-03-08 NOTE — Patient Instructions (Signed)
The tumor in your right breast is slowly getting smaller.  You have two more cycles of chemotherapy, with the last chemotherapy scheduled for March 20.  Return to see Dr. Dalbert Batman on March 26 for final treatment planning.  I think there is a reasonable chance that we will be able to do a partial mastectomy and sentinel node biopsy and conserve your breast. There will be some volume loss.  Ask Dr. Humphrey Rolls if we can remove the Port-A-Cath at the same time.  In the meantime, walk one hour a day, drink lots of fluids, and eat more fruits and vegetables and less fatty foods. We want you to be in good shape when you have your surgery.

## 2014-03-08 NOTE — Progress Notes (Signed)
Patient ID: Natalie Arias, female   DOB: 1961-11-06, 53 y.o.   MRN: [id number] History:  This patient returns for evaluation near the end of her neoadjuvant chemotherapy for treatment of the invasive cancer in the upper inner quadrant of the right breast.  Initially, she was referred by Dr. Elige Radon at [facility] health care for evaluation and management of a locally advanced invasive cancer of the right breast at the 3:00 position. Dr. Briscoe Deutscher is her primary care physician in [name]  . She was initially evaluated in the [facility] by Dr. Humphrey Rolls, Dr. Lisbeth Renshaw, and me.  She noticed a lump in her right breast in August.. She states her last mammogram was in December 2013 and nothing was seen. Mammograms showed a 3.5 cm suspicious mass in the right breast medially. Image guided biopsy shows invasive mammary carcinoma, probably invasive ductal carcinoma. This is a TNBC.Marland Kitchen  MRI shows that this is a solitary mass. Nodes looked negative by MRI.  She is interested in breast conservation. I placed a Port-A-Cath on 10/18/2013. She has completed 4 cycles of Adriamycin /Cytoxan and is now receiving  Taxol/.Carbo.  She states that she has 2 more cycles of this chemotherapy and the last one is scheduled for March 20. She says her hemoglobin is 8.4. She is scheduled to see Dr. Humphrey Rolls on March 13. She is anxious to get through her chemotherapy and have her definitive surgery. Marland Kitchen An MRI was performed on March 3. This is prior to the end of treatment. She has a residual enhancing mass in the inner right breast measuring 3.7 x 3.2 x 2.0 cm. This is compared to prior measurements of 3.9 x 3.7 x 3.1 cm.  Family history reveals breast cancer in a maternal grandmother and a paternal aunt but no ovarian cancer.  She is doing fairly well, considering. She says she thinks that the mass in her right breast is responding and is much less palpable now.   Past history, family history, social history, and review of systems are documented on  the chart, unchanged, and noncontributory except as described above She states that she has gained about 25 pounds in the past year.  Exam:  Patient looks well. Patient noted. Husband with her  Neck reveals no adenopathy or mass  Lungs are clear to auscultation bilaterally  Heart reveals regular rate and rhythm, no murmur, no ectopy  Right breast reveals that the palpable mass in the right breast in the upper-inner quadrant is  much less distinct. Previously it had been fairly obvious 3.5-4 cm in diameter. Now it is less distinct, maybe 2 cm in size. Skin is healthy. No axillary adenopathy.   Assessment:  Invasive mammary carcinoma right breast, upper inner quadrant, triple negative breast cancer. Initial stage cTII, N0  Clinically, she is responding to neoadjuvant chemotherapy, albeit slowly.  The patient continues to desire breast conservation surgery, and hopefully she is going to be a good candidate for that, considering the clinical response to date  25 pound weight gain in one year.   Plan:  Complete her neoadjuvant chemotherapy, with last cycle scheduled for March 20 Keep appointment with Dr. Marcy Panning on March 13 Return to see me on March 26. We will discuss and plan her definitive surgery at that time. I think we can make an attempt at partial mastectomy and sentinel node biopsy, although she will lose some volume and may need a reduction on the other side to get symmetry at some point.  I told her to discuss with Dr. Humphrey Rolls whether we could remove the Port-A-Cath at the time of her definitive breast surgery   Edsel Petrin. Dalbert Batman, M.D., [facility]  General and Minimally invasive Surgery  Breast and Colorectal Surgery  Office: [phone]  Pager: [phone]

## 2014-03-09 ENCOUNTER — Encounter (INDEPENDENT_AMBULATORY_CARE_PROVIDER_SITE_OTHER): Payer: BC Managed Care – PPO | Admitting: General Surgery

## 2014-03-09 ENCOUNTER — Telehealth: Payer: Self-pay | Admitting: Adult Health

## 2014-03-09 NOTE — Telephone Encounter (Signed)
Called patient on 03/08/14 regarding her heart rate.  She states her heart rate has usually ran from 90 to 100, but occasionally as high as 115.  There was one occasion that it was 125.  She is going to [place] tomorrow and is not fatigued, having palpitations, or having shortness of breath on exertion.  She states she will come in on Friday for labs and is willing to be transfused then or Saturday if indicated.  I instructed the patient to drink plenty of water, and reviewed reasons to go to the emergency room.  Patient verbalized understanding.    Minette Headland, [place] [phone]

## 2014-03-11 ENCOUNTER — Ambulatory Visit (HOSPITAL_BASED_OUTPATIENT_CLINIC_OR_DEPARTMENT_OTHER): Payer: BC Managed Care – PPO

## 2014-03-11 ENCOUNTER — Ambulatory Visit: Payer: BC Managed Care – PPO

## 2014-03-11 ENCOUNTER — Other Ambulatory Visit: Payer: Self-pay | Admitting: Oncology

## 2014-03-11 ENCOUNTER — Other Ambulatory Visit (HOSPITAL_BASED_OUTPATIENT_CLINIC_OR_DEPARTMENT_OTHER): Payer: BC Managed Care – PPO

## 2014-03-11 ENCOUNTER — Other Ambulatory Visit: Payer: Self-pay | Admitting: Adult Health

## 2014-03-11 ENCOUNTER — Telehealth: Payer: Self-pay | Admitting: Oncology

## 2014-03-11 ENCOUNTER — Ambulatory Visit (HOSPITAL_BASED_OUTPATIENT_CLINIC_OR_DEPARTMENT_OTHER): Payer: BC Managed Care – PPO | Admitting: Oncology

## 2014-03-11 ENCOUNTER — Ambulatory Visit (HOSPITAL_COMMUNITY)
Admission: RE | Admit: 2014-03-11 | Discharge: 2014-03-11 | Disposition: A | Discharge status: Home / Self Care | Payer: BC Managed Care – PPO | Source: Ambulatory Visit | Attending: Oncology | Admitting: Oncology

## 2014-03-11 VITALS — BP 134/66 | HR 132 | Temp 100.4°F | Resp 20 | Ht 65.0 in | Wt 213.0 lb

## 2014-03-11 DIAGNOSIS — C50311 Malignant neoplasm of lower-inner quadrant of right female breast: Secondary | ICD-10-CM

## 2014-03-11 DIAGNOSIS — C50219 Malignant neoplasm of upper-inner quadrant of unspecified female breast: Secondary | ICD-10-CM

## 2014-03-11 DIAGNOSIS — I951 Orthostatic hypotension: Secondary | ICD-10-CM

## 2014-03-11 DIAGNOSIS — K5900 Constipation, unspecified: Secondary | ICD-10-CM

## 2014-03-11 DIAGNOSIS — T451X5A Adverse effect of antineoplastic and immunosuppressive drugs, initial encounter: Secondary | ICD-10-CM | POA: Insufficient documentation

## 2014-03-11 DIAGNOSIS — Z171 Estrogen receptor negative status [ER-]: Secondary | ICD-10-CM

## 2014-03-11 DIAGNOSIS — D6481 Anemia due to antineoplastic chemotherapy: Secondary | ICD-10-CM | POA: Insufficient documentation

## 2014-03-11 DIAGNOSIS — Z5111 Encounter for antineoplastic chemotherapy: Secondary | ICD-10-CM

## 2014-03-11 DIAGNOSIS — G5790 Unspecified mononeuropathy of unspecified lower limb: Secondary | ICD-10-CM

## 2014-03-11 DIAGNOSIS — Z803 Family history of malignant neoplasm of breast: Secondary | ICD-10-CM

## 2014-03-11 LAB — CBC WITH DIFFERENTIAL/PLATELET
BASO%: 0.5 % (ref 0.0–2.0)
Basophils Absolute: 0 10*3/uL (ref 0.0–0.1)
EOS%: 0.3 % (ref 0.0–7.0)
Eosinophils Absolute: 0 10*3/uL (ref 0.0–0.5)
HCT: 22.8 % — ABNORMAL LOW (ref 34.8–46.6)
HGB: 7.5 g/dL — ABNORMAL LOW (ref 11.6–15.9)
LYMPH%: 29 % (ref 14.0–49.7)
MCH: 30.9 pg (ref 25.1–34.0)
MCHC: 32.9 g/dL (ref 31.5–36.0)
MCV: 93.8 fL (ref 79.5–101.0)
MONO#: 0.2 10*3/uL (ref 0.1–0.9)
MONO%: 6.2 % (ref 0.0–14.0)
NEUT#: 2.5 10*3/uL (ref 1.5–6.5)
NEUT%: 64 % (ref 38.4–76.8)
NRBC: 4 % — AB (ref 0–0)
PLATELETS: 175 10*3/uL (ref 145–400)
RBC: 2.43 10*6/uL — ABNORMAL LOW (ref 3.70–5.45)
RDW: 18.9 % — ABNORMAL HIGH (ref 11.2–14.5)
WBC: 3.9 10*3/uL (ref 3.9–10.3)
lymph#: 1.1 10*3/uL (ref 0.9–3.3)

## 2014-03-11 LAB — COMPREHENSIVE METABOLIC PANEL (CC13)
ALK PHOS: 64 U/L (ref 40–150)
ALT: 33 U/L (ref 0–55)
AST: 24 U/L (ref 5–34)
Albumin: 3.7 g/dL (ref 3.5–5.0)
Anion Gap: 10 mEq/L (ref 3–11)
BILIRUBIN TOTAL: 0.51 mg/dL (ref 0.20–1.20)
BUN: 10.7 mg/dL (ref 7.0–26.0)
CO2: 24 mEq/L (ref 22–29)
CREATININE: 0.8 mg/dL (ref 0.6–1.1)
Calcium: 8.9 mg/dL (ref 8.4–10.4)
Chloride: 105 mEq/L (ref 98–109)
Glucose: 153 mg/dl — ABNORMAL HIGH (ref 70–140)
Potassium: 3.9 mEq/L (ref 3.5–5.1)
Sodium: 139 mEq/L (ref 136–145)
Total Protein: 5.9 g/dL — ABNORMAL LOW (ref 6.4–8.3)

## 2014-03-11 LAB — ABO/RH: ABO/RH(D): A NEG

## 2014-03-11 MED ORDER — FAMOTIDINE IN NACL 20-0.9 MG/50ML-% IV SOLN
20.0000 mg | Freq: Once | INTRAVENOUS | Status: AC
  Administered 2014-03-11: 20 mg via INTRAVENOUS

## 2014-03-11 MED ORDER — HEPARIN SOD (PORK) LOCK FLUSH 100 UNIT/ML IV SOLN
500.0000 [IU] | Freq: Once | INTRAVENOUS | Status: AC | PRN
  Administered 2014-03-11: 500 [IU]
  Filled 2014-03-11: qty 5

## 2014-03-11 MED ORDER — ONDANSETRON 16 MG/50ML IVPB (CHCC)
INTRAVENOUS | Status: AC
  Filled 2014-03-11: qty 16

## 2014-03-11 MED ORDER — SODIUM CHLORIDE 0.9 % IJ SOLN
10.0000 mL | INTRAMUSCULAR | Status: DC | PRN
  Administered 2014-03-11: 10 mL
  Filled 2014-03-11: qty 10

## 2014-03-11 MED ORDER — SODIUM CHLORIDE 0.9 % IV SOLN
80.0000 mg/m2 | Freq: Once | INTRAVENOUS | Status: AC
  Administered 2014-03-11: 162 mg via INTRAVENOUS
  Filled 2014-03-11: qty 27

## 2014-03-11 MED ORDER — DEXAMETHASONE SODIUM PHOSPHATE 20 MG/5ML IJ SOLN
20.0000 mg | Freq: Once | INTRAMUSCULAR | Status: AC
  Administered 2014-03-11: 20 mg via INTRAVENOUS

## 2014-03-11 MED ORDER — DIPHENHYDRAMINE HCL 50 MG/ML IJ SOLN
INTRAMUSCULAR | Status: AC
  Filled 2014-03-11: qty 1

## 2014-03-11 MED ORDER — SODIUM CHLORIDE 0.9 % IV SOLN
300.0000 mg | Freq: Once | INTRAVENOUS | Status: AC
  Administered 2014-03-11: 300 mg via INTRAVENOUS
  Filled 2014-03-11: qty 30

## 2014-03-11 MED ORDER — SODIUM CHLORIDE 0.9 % IV SOLN
Freq: Once | INTRAVENOUS | Status: AC
  Administered 2014-03-11: 15:00:00 via INTRAVENOUS

## 2014-03-11 MED ORDER — FAMOTIDINE IN NACL 20-0.9 MG/50ML-% IV SOLN
INTRAVENOUS | Status: AC
  Filled 2014-03-11: qty 50

## 2014-03-11 MED ORDER — SODIUM CHLORIDE 0.9 % IV SOLN: Freq: Once | INTRAVENOUS | Status: DC

## 2014-03-11 MED ORDER — ONDANSETRON 16 MG/50ML IVPB (CHCC)
16.0000 mg | Freq: Once | INTRAVENOUS | Status: AC
  Administered 2014-03-11: 16 mg via INTRAVENOUS

## 2014-03-11 MED ORDER — DEXAMETHASONE SODIUM PHOSPHATE 20 MG/5ML IJ SOLN
INTRAMUSCULAR | Status: AC
  Filled 2014-03-11: qty 5

## 2014-03-11 MED ORDER — DIPHENHYDRAMINE HCL 50 MG/ML IJ SOLN
50.0000 mg | Freq: Once | INTRAMUSCULAR | Status: AC
  Administered 2014-03-11: 50 mg via INTRAVENOUS

## 2014-03-11 NOTE — Telephone Encounter (Signed)
, °

## 2014-03-11 NOTE — Patient Instructions (Signed)
[facility] Discharge Instructions for Patients Receiving Chemotherapy  Today you received the following chemotherapy agents taxol/carboplatin.   To help prevent nausea and vomiting after your treatment, we encourage you to take your nausea medication as directed.    If you develop nausea and vomiting that is not controlled by your nausea medication, call the clinic.   BELOW ARE SYMPTOMS THAT SHOULD BE REPORTED IMMEDIATELY:  *FEVER GREATER THAN 100.5 F  *CHILLS WITH OR WITHOUT FEVER  NAUSEA AND VOMITING THAT IS NOT CONTROLLED WITH YOUR NAUSEA MEDICATION  *UNUSUAL SHORTNESS OF BREATH  *UNUSUAL BRUISING OR BLEEDING  TENDERNESS IN MOUTH AND THROAT WITH OR WITHOUT PRESENCE OF ULCERS  *URINARY PROBLEMS  *BOWEL PROBLEMS  UNUSUAL RASH Items with * indicate a potential emergency and should be followed up as soon as possible.  Dehydration, Adult Dehydration is when you lose more fluids from the body than you take in. Vital organs like the kidneys, brain, and heart cannot function without a proper amount of fluids and salt. Any loss of fluids from the body can cause dehydration.  CAUSES   Vomiting.  Diarrhea.  Excessive sweating.  Excessive urine output.  Fever. SYMPTOMS  Mild dehydration  Thirst.  Dry lips.  Slightly dry mouth. Moderate dehydration  Very dry mouth.  Sunken eyes.  Skin does not bounce back quickly when lightly pinched and released.  Dark urine and decreased urine production.    Feel free to call the clinic you have any questions or concerns. The clinic phone number is [phone].

## 2014-03-11 NOTE — Progress Notes (Signed)
OK to treat with Hgb 7.5 per Dr. Humphrey Rolls.  1 L NS added to treatment today.  T&C to be drawn in infusion today. Patient to receive 2 units RBC tomorrow 3/14.  Scheduling notified and confirmed.  HAR obtained.

## 2014-03-12 ENCOUNTER — Ambulatory Visit (HOSPITAL_BASED_OUTPATIENT_CLINIC_OR_DEPARTMENT_OTHER): Payer: BC Managed Care – PPO

## 2014-03-12 VITALS — BP 116/68 | HR 87 | Temp 97.5°F | Resp 20

## 2014-03-12 DIAGNOSIS — D6481 Anemia due to antineoplastic chemotherapy: Secondary | ICD-10-CM

## 2014-03-12 DIAGNOSIS — T451X5A Adverse effect of antineoplastic and immunosuppressive drugs, initial encounter: Principal | ICD-10-CM

## 2014-03-12 LAB — PREPARE RBC (CROSSMATCH)

## 2014-03-12 MED ORDER — DIPHENHYDRAMINE HCL 25 MG PO CAPS
ORAL_CAPSULE | ORAL | Status: AC
  Filled 2014-03-12: qty 1

## 2014-03-12 MED ORDER — HEPARIN SOD (PORK) LOCK FLUSH 100 UNIT/ML IV SOLN
500.0000 [IU] | Freq: Every day | INTRAVENOUS | Status: AC | PRN
  Administered 2014-03-12: 500 [IU]
  Filled 2014-03-12: qty 5

## 2014-03-12 MED ORDER — DIPHENHYDRAMINE HCL 25 MG PO CAPS
25.0000 mg | ORAL_CAPSULE | Freq: Once | ORAL | Status: AC
  Administered 2014-03-12: 25 mg via ORAL

## 2014-03-12 MED ORDER — SODIUM CHLORIDE 0.9 % IV SOLN
250.0000 mL | Freq: Once | INTRAVENOUS | Status: AC
  Administered 2014-03-12: 250 mL via INTRAVENOUS

## 2014-03-12 MED ORDER — SODIUM CHLORIDE 0.9 % IJ SOLN
10.0000 mL | INTRAMUSCULAR | Status: AC | PRN
  Administered 2014-03-12: 10 mL
  Filled 2014-03-12: qty 10

## 2014-03-12 MED ORDER — ACETAMINOPHEN 325 MG PO TABS
650.0000 mg | ORAL_TABLET | Freq: Once | ORAL | Status: AC
  Administered 2014-03-12: 650 mg via ORAL

## 2014-03-12 MED ORDER — ACETAMINOPHEN 325 MG PO TABS
ORAL_TABLET | ORAL | Status: AC
  Filled 2014-03-12: qty 2

## 2014-03-12 NOTE — Patient Instructions (Signed)
Blood Transfusion Information WHAT IS A BLOOD TRANSFUSION? A transfusion is the replacement of blood or some of its parts. Blood is made up of multiple cells which provide different functions.  Red blood cells carry oxygen and are used for blood loss replacement.  White blood cells fight against infection.  Platelets control bleeding.  Plasma helps clot blood.  Other blood products are available for specialized needs, such as hemophilia or other clotting disorders. BEFORE THE TRANSFUSION  Who gives blood for transfusions?   You may be able to donate blood to be used at a later date on yourself (autologous donation).  Relatives can be asked to donate blood. This is generally not any safer than if you have received blood from a stranger. The same precautions are taken to ensure safety when a relative's blood is donated.  Healthy volunteers who are fully evaluated to make sure their blood is safe. This is blood bank blood. Transfusion therapy is the safest it has ever been in the practice of medicine. Before blood is taken from a donor, a complete history is taken to make sure that person has no history of diseases nor engages in risky social behavior (examples are intravenous drug use or sexual activity with multiple partners). The donor's travel history is screened to minimize risk of transmitting infections, such as malaria. The donated blood is tested for signs of infectious diseases, such as HIV and hepatitis. The blood is then tested to be sure it is compatible with you in order to minimize the chance of a transfusion reaction. If you or a relative donates blood, this is often done in anticipation of surgery and is not appropriate for emergency situations. It takes many days to process the donated blood. RISKS AND COMPLICATIONS Although transfusion therapy is very safe and saves many lives, the main dangers of transfusion include:   Getting an infectious disease.  Developing a  transfusion reaction. This is an allergic reaction to something in the blood you were given. Every precaution is taken to prevent this. The decision to have a blood transfusion has been considered carefully by your caregiver before blood is given. Blood is not given unless the benefits outweigh the risks. AFTER THE TRANSFUSION  Right after receiving a blood transfusion, you will usually feel much better and more energetic. This is especially true if your red blood cells have gotten low (anemic). The transfusion raises the level of the red blood cells which carry oxygen, and this usually causes an energy increase.  The nurse administering the transfusion will monitor you carefully for complications. HOME CARE INSTRUCTIONS  No special instructions are needed after a transfusion. You may find your energy is better. Speak with your caregiver about any limitations on activity for underlying diseases you may have. SEEK MEDICAL CARE IF:   Your condition is not improving after your transfusion.  You develop redness or irritation at the intravenous (IV) site. SEEK IMMEDIATE MEDICAL CARE IF:  Any of the following symptoms occur over the next 12 hours:  Shaking chills.  You have a temperature by mouth above 102 F (38.9 C), not controlled by medicine.  Chest, back, or muscle pain.  People around you feel you are not acting correctly or are confused.  Shortness of breath or difficulty breathing.  Dizziness and fainting.  You get a rash or develop hives.  You have a decrease in urine output.  Your urine turns a dark color or changes to pink, red, or brown. Any of the following   symptoms occur over the next 10 days:  You have a temperature by mouth above 102 F (38.9 C), not controlled by medicine.  Shortness of breath.  Weakness after normal activity.  The white part of the eye turns yellow (jaundice).  You have a decrease in the amount of urine or are urinating less often.  Your  urine turns a dark color or changes to pink, red, or brown. Document Released: 12/13/2000 Document Revised: 03/09/2012 Document Reviewed: 08/01/2008 Exit[employer] Patient Information 2014 [employer].  

## 2014-03-12 NOTE — Progress Notes (Signed)
Vitals pre-blood wnl

## 2014-03-13 LAB — TYPE AND SCREEN
ABO/RH(D): A NEG
Antibody Screen: NEGATIVE
Unit division: 0
Unit division: 0

## 2014-03-16 NOTE — Progress Notes (Signed)
[place]  [phone] [phone]) [phone]  OFFICE PROGRESS NOTE   ID: [name]   DOB: 04-Feb-1961  MR#: [id number]  DGU#:[id number]   PCP: [name], PA-C SU: Fanny Skates, MD RAD ONC:  Kyung Rudd, MD   DIAGNOSES: Natalie Arias is a 53 y.o. 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   STAGE:  Breast cancer of lower-inner quadrant of right female breast   Primary site: Breast (Right)   Staging method: AJCC 7th Edition   Clinical: Stage IIA (T2, N0, cM0)   Summary: Stage IIA (T2, N0, cM0)   PRIOR THERAPY: #1 Right breast needle core biopsy performed on 09/30/2013 resulting in pathology that revealed an invasive mammary carcinoma likely ductal phenotype, intermediate grade, with a tumor that is estrogen receptor negative, progesterone receptor negative, HER-2/neu negative, with a Ki-67 80%.  #2 Bilateral breast MRI on 10/11/2013 showed within the right breast there was an irregularly marginated enhancing mass with central necrosis located in the upper-inner quadrant at the 2 o'clock position (the middle 1/3).  The mass measures 3.9 x 3.7 x 3.1 cm in size and was associated with clip artifact.  There were no additional areas of worrisome enhancement within the right breast.  The left breast did not have mass or abnormal enhancement.  No abnormal appearing lymph nodes (clinical stage IIA T2 N0).  #3 Patient was recommended neoadjuvant chemotherapy utilizing Adriamycin/Cytoxan x 4 cycles with neulasta support followed by Taxol/Carbo weekly x 12 weeks.      #4 Genetic testing on 01/06/14, results were negative.    CURRENT THERAPY:  NeoadjuvantTaxol/Carbo week 11  INTERVAL HISTORY: Natalie Arias was seen today for evaluation prior to receiving her eleventh cycle of Taxol/Carbo.  She is doing well today. She does have  neuropathic type of pain in her feet but it is stable. She is on gabapentin and super B complex. She feels that this is helping her. She does occasionally develop constipation and she is on senna. Her nausea is well-controlled with Compazine ondansetron. She is looking forward to finishing up her treatments next week. She thereafter will be seen by her surgeon to begin the process of planning her definitive therapy  PAST MEDICAL HISTORY: Past Medical History  Diagnosis Date  . Asthma 20's    allergic asthema  . Seasonal allergies   . PONV (postoperative nausea and vomiting)   . Breast cancer dx'd 10/01/2013    right; triplen negative    PAST SURGICAL HISTORY: Past Surgical History  Procedure Laterality Date  . Laparoscopic endometriosis fulguration  1991  . Abdominal hysterectomy  1992  . Portacath placement N/A 10/18/2013    Procedure: INSERTION PORT-A-CATH WITH ULTRASOUND;  Surgeon: Adin Hector, MD;  Location: [facility];  Service: General;  Laterality: N/A;    FAMILY HISTORY: Family History  Problem Relation Age of Onset  . Lung cancer Maternal Uncle     smoker  . Breast cancer Paternal Aunt     dx in her late 110s; maternal half sister to patient's father  . Breast cancer Maternal Grandmother 42  . Lung cancer Maternal Uncle     smoker  . Hypertension Father   . Heart disease Father   . Parkinsonism Father   . Skin cancer Father   . Hypercholesterolemia Mother   . Gallstones Mother   . Healthy Brother   . COPD  Maternal Grandfather   . Lung cancer Cousin     smoker; paternal cousin    SOCIAL HISTORY: History  Substance Use Topics  . Smoking status: Never Smoker   . Smokeless tobacco: Never Used  . Alcohol Use: No     Comment: once a month    ALLERGIES: Allergies  Allergen Reactions  . Penicillins Hives    CURRENT MEDICATIONS: Current Outpatient Prescriptions  Medication Sig Dispense Refill  . B Complex-C (SUPER B COMPLEX PO) Take 1 tablet by mouth.       . dexamethasone (DECADRON) 4 MG tablet Take 2 tablets (8 mg total) by mouth 2 (two) times daily with a meal. Take two times a day starting the day after chemotherapy for 3 days.  30 tablet  2  . gabapentin (NEURONTIN) 100 MG capsule Take 1 capsule (100 mg total) by mouth 3 (three) times daily.  90 capsule  1  . hydrocortisone (ANUSOL-HC) 2.5 % rectal cream Place 1 application rectally 2 (two) times daily.  30 g  0  . ibuprofen (ADVIL,MOTRIN) 200 MG tablet Take 200 mg by mouth every 6 (six) hours as needed for pain.      Marland Kitchen lidocaine-prilocaine (EMLA) cream Apply topically as needed. To port-a-cath 1.5 hours before chemotherapy and cover  30 g  1  . moxifloxacin (AVELOX) 400 MG tablet Take 1 tablet (400 mg total) by mouth daily at 8 pm.  14 tablet  0  . ondansetron (ZOFRAN) 8 MG tablet Take 1 tablet (8 mg total) by mouth 2 (two) times daily. Take two times a day starting the day after chemo for 3 days. Then take two times a day as needed for nausea or vomiting.  30 tablet  1  . polyethylene glycol (MIRALAX / GLYCOLAX) packet Take 17 g by mouth daily as needed.      . prochlorperazine (COMPAZINE) 10 MG tablet Take 1 tablet (10 mg total) by mouth every 6 (six) hours as needed (Nausea or vomiting).  30 tablet  1  . SENNA PO Take by mouth.      Marland Kitchen UNABLE TO FIND 1 each by Other route as needed. Cranial Prosthesis       No current facility-administered medications for this visit.   REVIEW OF SYSTEMS:  A 10 point review of systems was conducted and is otherwise negative except for what is noted above.    PHYSICAL EXAMINATION: Blood pressure 134/66, pulse 132, temperature 100.4 F (38 C), temperature source Oral, resp. rate 20, height '5\' 5"'  (1.651 m), weight 213 lb (96.616 kg). GENERAL: Patient is a well appearing female in no acute distress HEENT:  Sclerae anicteric.  Oropharynx clear and moist. No ulcerations or evidence of oropharyngeal candidiasis. Neck is supple.  NODES:  No cervical,  supraclavicular, or axillary lymphadenopathy palpated.  BREAST EXAM:  Small mass palpated in the upper inner quadrant of the right breast LUNGS:  Clear to auscultation bilaterally.  No wheezes or rhonchi. HEART:  Regular rate and rhythm. No murmur appreciated. ABDOMEN:  Soft, nontender.  Positive, normoactive bowel sounds. No organomegaly palpated. MSK:  No focal spinal tenderness to palpation. Full range of motion bilaterally in the upper extremities. EXTREMITIES:  No peripheral edema.   SKIN:  Clear with no obvious rashes or skin changes. No nail dyscrasia. NEURO:  Nonfocal. Well oriented.  Appropriate affect. ECOG FS: 1 - Symptomatic but completely ambulatory  LABORATORIES:   Chemistry      Component Value Date/Time   NA 139 03/11/2014  1327   NA 140 10/14/2013 0930   K 3.9 03/11/2014 1327   K 4.7 10/14/2013 0930   CL 105 10/14/2013 0930   CO2 24 03/11/2014 1327   CO2 29 10/14/2013 0930   BUN 10.7 03/11/2014 1327   BUN 12 10/14/2013 0930   CREATININE 0.8 03/11/2014 1327   CREATININE 0.71 10/14/2013 0930      Component Value Date/Time   CALCIUM 8.9 03/11/2014 1327   CALCIUM 9.6 10/14/2013 0930   ALKPHOS 64 03/11/2014 1327   ALKPHOS 70 10/14/2013 0930   AST 24 03/11/2014 1327   AST 16 10/14/2013 0930   ALT 33 03/11/2014 1327   ALT 18 10/14/2013 0930   BILITOT 0.51 03/11/2014 1327   BILITOT 0.3 10/14/2013 0930      Lab Results  Component Value Date   WBC 3.9 03/11/2014   HGB 7.5* 03/11/2014   HCT 22.8* 03/11/2014   MCV 93.8 03/11/2014   PLT 175 03/11/2014    STUDIES/IMAGING: 1.  2-D echocardiogram on 10/11/2013 showed a left ventricular ejection fraction of 60% - 65%.  ASSESSMENT: ADIANA SMELCER is a 53 y.o. female diagnosed with triple negative invasive ductal carcinoma of the right breast in 09/2013:  #1 Intermediate grade invasive ductal carcinoma of the right breast. The mass measures 3.5 cm by ultrasound. Prognostic markers revealed the tumor to be estrogen receptor  negative, progesterone receptor negative, HER-2/neu negative, with an elevated proliferation marker Ki-67 of 80%.   #2  Bilateral breast MRI on 10/11/2013 showed within the right breast there was an irregularly marginated enhancing mass with central necrosis located in the upper-inner quadrant at the 2 o'clock position (the middle 1/3).  The mass measures 3.9 x 3.7 x 3.1 cm in size and was associated with clip artifact.  There were no additional areas of worrisome enhancement within the right breast.  The left breast did not have mass or abnormal enhancement.  No abnormal appearing lymph nodes (clinical stage IIA T2 N0).    #3  Patient started neoadjuvant chemotherapy of dose dense Adriamycin/Cytoxan with 4 planned doses on 10/27/2013.  Day 2 granulocyte support with [name] injection.  This was followed by neoadjuvant chemotherapy consisting of Taxol/carbo weekly x 12 weeks which started on 12/29/14.  #4  If patient does successfully undergo lumpectomy she will need radiation therapy.  #5 Patient has a family history of breast cancer and herself has triple negative breast cancer and underwent genetic counseling and testing on 01/06/14, results were negative.   PLAN:   #1  patient will proceed with cycle 11 of Taxol/carboplatinum.  #2 she is encouraged to continue to exercise and be mobile as much as possible.  #3 have encouraged her to continue to eat foods that are appetizing to her since her appetite is a bit down.  #4 neuropathy: Patient will continue gabapentin.  #5 constipation: Patient will continue senna as needed to keep her bowels moving.  #6 followup: Patient will be seen back in one week for cycle #12 of Taxol carboplatinum. There've after she will followup with Dr. Dalbert Batman for surgical planning.   All questions answered.  Mrs. Hunke was encouraged to contact us in the interim with any questions, concerns, or problems.  I spent 25 minutes counseling the patient face to face.   The total time spent in the appointment was 30 minutes.  Marcy Panning, MD Medical/Oncology [facility] [phone] (beeper) [phone] (Office)

## 2014-03-18 ENCOUNTER — Encounter: Payer: Self-pay | Admitting: Oncology

## 2014-03-18 ENCOUNTER — Ambulatory Visit (HOSPITAL_BASED_OUTPATIENT_CLINIC_OR_DEPARTMENT_OTHER): Payer: BC Managed Care – PPO

## 2014-03-18 ENCOUNTER — Encounter: Payer: Self-pay | Admitting: Adult Health

## 2014-03-18 ENCOUNTER — Telehealth: Payer: Self-pay | Admitting: Adult Health

## 2014-03-18 ENCOUNTER — Ambulatory Visit (HOSPITAL_BASED_OUTPATIENT_CLINIC_OR_DEPARTMENT_OTHER): Payer: BC Managed Care – PPO | Admitting: Adult Health

## 2014-03-18 ENCOUNTER — Other Ambulatory Visit (HOSPITAL_BASED_OUTPATIENT_CLINIC_OR_DEPARTMENT_OTHER): Payer: BC Managed Care – PPO

## 2014-03-18 VITALS — BP 143/82 | HR 104 | Temp 98.7°F | Resp 18 | Ht 65.0 in | Wt 210.1 lb

## 2014-03-18 DIAGNOSIS — C50219 Malignant neoplasm of upper-inner quadrant of unspecified female breast: Secondary | ICD-10-CM

## 2014-03-18 DIAGNOSIS — R21 Rash and other nonspecific skin eruption: Secondary | ICD-10-CM

## 2014-03-18 DIAGNOSIS — Z171 Estrogen receptor negative status [ER-]: Secondary | ICD-10-CM

## 2014-03-18 DIAGNOSIS — Z5111 Encounter for antineoplastic chemotherapy: Secondary | ICD-10-CM

## 2014-03-18 DIAGNOSIS — C50311 Malignant neoplasm of lower-inner quadrant of right female breast: Secondary | ICD-10-CM

## 2014-03-18 LAB — CBC WITH DIFFERENTIAL/PLATELET
BASO%: 0.6 % (ref 0.0–2.0)
Basophils Absolute: 0 10*3/uL (ref 0.0–0.1)
EOS ABS: 0 10*3/uL (ref 0.0–0.5)
EOS%: 0.7 % (ref 0.0–7.0)
HCT: 30.7 % — ABNORMAL LOW (ref 34.8–46.6)
HGB: 10.1 g/dL — ABNORMAL LOW (ref 11.6–15.9)
LYMPH#: 1.6 10*3/uL (ref 0.9–3.3)
LYMPH%: 52.2 % — ABNORMAL HIGH (ref 14.0–49.7)
MCH: 30.3 pg (ref 25.1–34.0)
MCHC: 33 g/dL (ref 31.5–36.0)
MCV: 91.8 fL (ref 79.5–101.0)
MONO#: 0.3 10*3/uL (ref 0.1–0.9)
MONO%: 8.4 % (ref 0.0–14.0)
NEUT#: 1.2 10*3/uL — ABNORMAL LOW (ref 1.5–6.5)
NEUT%: 38.1 % — ABNORMAL LOW (ref 38.4–76.8)
Platelets: 180 10*3/uL (ref 145–400)
RBC: 3.34 10*6/uL — AB (ref 3.70–5.45)
RDW: 18.6 % — AB (ref 11.2–14.5)
WBC: 3 10*3/uL — ABNORMAL LOW (ref 3.9–10.3)

## 2014-03-18 LAB — COMPREHENSIVE METABOLIC PANEL (CC13)
ALBUMIN: 3.6 g/dL (ref 3.5–5.0)
ALT: 35 U/L (ref 0–55)
AST: 20 U/L (ref 5–34)
Alkaline Phosphatase: 63 U/L (ref 40–150)
Anion Gap: 11 mEq/L (ref 3–11)
BUN: 10.8 mg/dL (ref 7.0–26.0)
CHLORIDE: 106 meq/L (ref 98–109)
CO2: 26 meq/L (ref 22–29)
Calcium: 9.3 mg/dL (ref 8.4–10.4)
Creatinine: 0.7 mg/dL (ref 0.6–1.1)
GLUCOSE: 156 mg/dL — AB (ref 70–140)
POTASSIUM: 3.8 meq/L (ref 3.5–5.1)
SODIUM: 143 meq/L (ref 136–145)
TOTAL PROTEIN: 6.2 g/dL — AB (ref 6.4–8.3)
Total Bilirubin: 0.56 mg/dL (ref 0.20–1.20)

## 2014-03-18 MED ORDER — DEXAMETHASONE SODIUM PHOSPHATE 20 MG/5ML IJ SOLN
INTRAMUSCULAR | Status: AC
  Filled 2014-03-18: qty 5

## 2014-03-18 MED ORDER — SODIUM CHLORIDE 0.9 % IJ SOLN
10.0000 mL | INTRAMUSCULAR | Status: DC | PRN
  Administered 2014-03-18: 10 mL
  Filled 2014-03-18: qty 10

## 2014-03-18 MED ORDER — DEXAMETHASONE SODIUM PHOSPHATE 20 MG/5ML IJ SOLN
20.0000 mg | Freq: Once | INTRAMUSCULAR | Status: AC
  Administered 2014-03-18: 20 mg via INTRAVENOUS

## 2014-03-18 MED ORDER — DIPHENHYDRAMINE HCL 50 MG/ML IJ SOLN
50.0000 mg | Freq: Once | INTRAMUSCULAR | Status: AC
  Administered 2014-03-18: 50 mg via INTRAVENOUS

## 2014-03-18 MED ORDER — ONDANSETRON 16 MG/50ML IVPB (CHCC)
INTRAVENOUS | Status: AC
  Filled 2014-03-18: qty 16

## 2014-03-18 MED ORDER — SODIUM CHLORIDE 0.9 % IV SOLN
Freq: Once | INTRAVENOUS | Status: AC
  Administered 2014-03-18: 15:00:00 via INTRAVENOUS

## 2014-03-18 MED ORDER — SODIUM CHLORIDE 0.9 % IV SOLN
300.0000 mg | Freq: Once | INTRAVENOUS | Status: AC
  Administered 2014-03-18: 300 mg via INTRAVENOUS
  Filled 2014-03-18: qty 30

## 2014-03-18 MED ORDER — FAMOTIDINE IN NACL 20-0.9 MG/50ML-% IV SOLN
INTRAVENOUS | Status: AC
  Filled 2014-03-18: qty 50

## 2014-03-18 MED ORDER — HEPARIN SOD (PORK) LOCK FLUSH 100 UNIT/ML IV SOLN
500.0000 [IU] | Freq: Once | INTRAVENOUS | Status: AC | PRN
  Administered 2014-03-18: 500 [IU]
  Filled 2014-03-18: qty 5

## 2014-03-18 MED ORDER — SODIUM CHLORIDE 0.9 % IV SOLN
80.0000 mg/m2 | Freq: Once | INTRAVENOUS | Status: AC
  Administered 2014-03-18: 162 mg via INTRAVENOUS
  Filled 2014-03-18: qty 27

## 2014-03-18 MED ORDER — DIPHENHYDRAMINE HCL 50 MG/ML IJ SOLN
INTRAMUSCULAR | Status: AC
  Filled 2014-03-18: qty 1

## 2014-03-18 MED ORDER — FAMOTIDINE IN NACL 20-0.9 MG/50ML-% IV SOLN
20.0000 mg | Freq: Once | INTRAVENOUS | Status: AC
  Administered 2014-03-18: 20 mg via INTRAVENOUS

## 2014-03-18 MED ORDER — ONDANSETRON 16 MG/50ML IVPB (CHCC)
16.0000 mg | Freq: Once | INTRAVENOUS | Status: AC
  Administered 2014-03-18: 16 mg via INTRAVENOUS

## 2014-03-18 NOTE — Progress Notes (Signed)
Natalie Arias  [phone] [phone]  OFFICE PROGRESS NOTE   ID: Natalie Arias   DOB: 1961-01-24  MR#: [id number]  ZHG#:[id number]   PCP: [name], PA-C SU: Fanny Skates, MD RAD ONC:  Kyung Rudd, MD   DIAGNOSES: Natalie Arias is a 53 y.o. 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   STAGE:  Breast cancer of lower-inner quadrant of right female breast   Primary site: Breast (Right)   Staging method: AJCC 7th Edition   Clinical: Stage IIA (T2, N0, cM0)   Summary: Stage IIA (T2, N0, cM0)   PRIOR THERAPY: #1 Right breast needle core biopsy performed on 09/30/2013 resulting in pathology that revealed an invasive mammary carcinoma likely ductal phenotype, intermediate grade, with a tumor that is estrogen receptor negative, progesterone receptor negative, HER-2/neu negative, with a Ki-67 80%.  #2 Bilateral breast MRI on 10/11/2013 showed within the right breast there was an irregularly marginated enhancing mass with central necrosis located in the upper-inner quadrant at the 2 o'clock position (the middle 1/3).  The mass measures 3.9 x 3.7 x 3.1 cm in size and was associated with clip artifact.  There were no additional areas of worrisome enhancement within the right breast.  The left breast did not have mass or abnormal enhancement.  No abnormal appearing lymph nodes (clinical stage IIA T2 N0).  #3 Patient was recommended neoadjuvant chemotherapy utilizing Adriamycin/Cytoxan x 4 cycles with neulasta support followed by Taxol/Carbo weekly x 12 weeks.      #4 Genetic testing on 01/06/14, results were negative.    CURRENT THERAPY:  NeoadjuvantTaxol/Carbo week 12  INTERVAL HISTORY: Natalie Arias is seen today for evaluation of her prior to her twelfth and final weekly treatment of Taxol and Carbo.  She is very excited to  be finishing treatment.  She received blood last week for a hemoglobin of 7.5.  She tolerated it well.  There is a rash on her hands and arms that is slightly worse than last week.  It is just beginning to itch.  She is taking Gabapentin TID and her neuropathy is stable.  She denies fevers, chills, nausea, vomiting, constipation, diarrhea, or any other concerns.    PAST MEDICAL HISTORY: Past Medical History  Diagnosis Date  . Asthma 20's    allergic asthema  . Seasonal allergies   . PONV (postoperative nausea and vomiting)   . Breast cancer dx'd 10/01/2013    right; triplen negative    PAST SURGICAL HISTORY: Past Surgical History  Procedure Laterality Date  . Laparoscopic endometriosis fulguration  1991  . Abdominal hysterectomy  1992  . Portacath placement N/A 10/18/2013    Procedure: INSERTION PORT-A-CATH WITH ULTRASOUND;  Surgeon: Adin Hector, MD;  Location: [facility];  Service: General;  Laterality: N/A;    FAMILY HISTORY: Family History  Problem Relation Age of Onset  . Lung cancer Maternal Uncle     smoker  . Breast cancer Paternal Aunt     dx in her late 59s; maternal half sister to patient's father  . Breast cancer Maternal Grandmother 72  . Lung cancer Maternal Uncle     smoker  . Hypertension Father   . Heart disease Father   . Parkinsonism Father   . Skin cancer Father   . Hypercholesterolemia Mother   . Gallstones Mother   . Healthy Brother   .  COPD Maternal Grandfather   . Lung cancer Cousin     smoker; paternal cousin    SOCIAL HISTORY: History  Substance Use Topics  . Smoking status: Never Smoker   . Smokeless tobacco: Never Used  . Alcohol Use: No     Comment: once a month    ALLERGIES: Allergies  Allergen Reactions  . Penicillins Hives    CURRENT MEDICATIONS: Current Outpatient Prescriptions  Medication Sig Dispense Refill  . B Complex-C (SUPER B COMPLEX PO) Take 1 tablet by mouth.      . gabapentin (NEURONTIN) 100 MG capsule Take 1  capsule (100 mg total) by mouth 3 (three) times daily.  90 capsule  1  . hydrocortisone (ANUSOL-HC) 2.5 % rectal cream Place 1 application rectally 2 (two) times daily.  30 g  0  . ibuprofen (ADVIL,MOTRIN) 200 MG tablet Take 200 mg by mouth every 6 (six) hours as needed for pain.      Marland Kitchen lidocaine-prilocaine (EMLA) cream Apply topically as needed. To port-a-cath 1.5 hours before chemotherapy and cover  30 g  1  . SENNA PO Take by mouth.      Marland Kitchen UNABLE TO FIND 1 each by Other route as needed. Cranial Prosthesis      . dexamethasone (DECADRON) 4 MG tablet Take 2 tablets (8 mg total) by mouth 2 (two) times daily with a meal. Take two times a day starting the day after chemotherapy for 3 days.  30 tablet  2  . ondansetron (ZOFRAN) 8 MG tablet Take 1 tablet (8 mg total) by mouth 2 (two) times daily. Take two times a day starting the day after chemo for 3 days. Then take two times a day as needed for nausea or vomiting.  30 tablet  1  . polyethylene glycol (MIRALAX / GLYCOLAX) packet Take 17 g by mouth daily as needed.      . prochlorperazine (COMPAZINE) 10 MG tablet Take 1 tablet (10 mg total) by mouth every 6 (six) hours as needed (Nausea or vomiting).  30 tablet  1   No current facility-administered medications for this visit.   REVIEW OF SYSTEMS:  A 10 point review of systems was conducted and is otherwise negative except for what is noted above.    PHYSICAL EXAMINATION: Blood pressure 143/82, pulse 104, temperature 98.7 F (37.1 C), temperature source Oral, resp. rate 18, height '5\' 5"'  (1.651 m), weight 210 lb 1.6 oz (95.301 kg), SpO2 97.00%. GENERAL: Patient is a well appearing female in no acute distress HEENT:  Sclerae anicteric.  Oropharynx clear and moist. No ulcerations or evidence of oropharyngeal candidiasis. Neck is supple.  NODES:  No cervical, supraclavicular, or axillary lymphadenopathy palpated.  BREAST EXAM:  Small mass palpated in the upper inner quadrant of the right breast LUNGS:   Clear to auscultation bilaterally.  No wheezes or rhonchi. HEART:  Regular rate and rhythm. No murmur appreciated. ABDOMEN:  Soft, nontender.  Positive, normoactive bowel sounds. No organomegaly palpated. MSK:  No focal spinal tenderness to palpation. Full range of motion bilaterally in the upper extremities. EXTREMITIES:  No peripheral edema.   SKIN:  Clear with no obvious rashes or skin changes. No nail dyscrasia. NEURO:  Nonfocal. Well oriented.  Appropriate affect. ECOG FS: 1 - Symptomatic but completely ambulatory  LABORATORIES:   Chemistry      Component Value Date/Time   NA 143 03/18/2014 1331   NA 140 10/14/2013 0930   K 3.8 03/18/2014 1331   K 4.7 10/14/2013 0930  CL 105 10/14/2013 0930   CO2 26 03/18/2014 1331   CO2 29 10/14/2013 0930   BUN 10.8 03/18/2014 1331   BUN 12 10/14/2013 0930   CREATININE 0.7 03/18/2014 1331   CREATININE 0.71 10/14/2013 0930      Component Value Date/Time   CALCIUM 9.3 03/18/2014 1331   CALCIUM 9.6 10/14/2013 0930   ALKPHOS 63 03/18/2014 1331   ALKPHOS 70 10/14/2013 0930   AST 20 03/18/2014 1331   AST 16 10/14/2013 0930   ALT 35 03/18/2014 1331   ALT 18 10/14/2013 0930   BILITOT 0.56 03/18/2014 1331   BILITOT 0.3 10/14/2013 0930      Lab Results  Component Value Date   WBC 3.0* 03/18/2014   HGB 10.1* 03/18/2014   HCT 30.7* 03/18/2014   MCV 91.8 03/18/2014   PLT 180 03/18/2014    STUDIES/IMAGING: 1.  2-D echocardiogram on 10/11/2013 showed a left ventricular ejection fraction of 60% - 65%.  ASSESSMENT: KIERRE DEINES is a 53 y.o. female diagnosed with triple negative invasive ductal carcinoma of the right breast in 09/2013:  #1 Intermediate grade invasive ductal carcinoma of the right breast. The mass measures 3.5 cm by ultrasound. Prognostic markers revealed the tumor to be estrogen receptor negative, progesterone receptor negative, HER-2/neu negative, with an elevated proliferation marker Ki-67 of 80%.   #2  Bilateral breast MRI on  10/11/2013 showed within the right breast there was an irregularly marginated enhancing mass with central necrosis located in the upper-inner quadrant at the 2 o'clock position (the middle 1/3).  The mass measures 3.9 x 3.7 x 3.1 cm in size and was associated with clip artifact.  There were no additional areas of worrisome enhancement within the right breast.  The left breast did not have mass or abnormal enhancement.  No abnormal appearing lymph nodes (clinical stage IIA T2 N0).    #3  Patient is s/p neoadjuvant chemotherapy of dose dense Adriamycin/Cytoxan with 4 planned doses on 10/27/2013.  Day 2 granulocyte support with [name] injection.  She will now complete neoadjuvant chemotherapy consisting of Taxol/carbo weekly x 12 weeks which started from 12/29/14 thorough 03/18/14.  Repeat breast MRI on 03/01/14 determined the enhancement to be 3.7 x 2.2 x 2.0 cm in the right breast.    #4  If patient does successfully undergo lumpectomy she will need radiation therapy.  #5 Patient has a family history of breast cancer and herself has triple negative breast cancer and underwent genetic counseling and testing on 01/06/14, results were negative.   PLAN:   #1 Mrs. Cleek is doing well today.  I've reviewed her CBC with her in detail.  Her ANC is 1200.  Per Dr. Humphrey Rolls, she can receive chemotherapy with this [place] and will return in one week for a lab only check.  Her CMP is pending  #2  Due to the rash, I asked Dr. Humphrey Rolls to evaluate her to determine if she should get her final dose of chemotherapy.  Per Dr. Humphrey Rolls, the patient will proceed with chemotherapy and use Benadryl if needed.  I asked the patient to inform me if it worsens over the next week and I would see her and prescribe a steroid taper.   #3  Mrs. Townsel will continue the Gabapentin TID for her neuropathy, it is stable and not interfering with her activities of daily living.    #4  She will return in one week for lab only and in four weeks for a  tentative appointment.  She will see Dr. Excell Seltzer next week for surgical planning.     All questions answered.  Mrs. Demorest was encouraged to contact us in the interim with any questions, concerns, or problems.  I spent 25 minutes counseling the patient face to face.  The total time spent in the appointment was 30 minutes.  Minette Headland, [place] [phone]

## 2014-03-18 NOTE — Progress Notes (Signed)
Per Charlestine Massed, NP,  Ok to treat with ANC 1.2.

## 2014-03-18 NOTE — Patient Instructions (Signed)
[facility] [place] Discharge Instructions for Patients Receiving Chemotherapy  CONGRATULATIONS ON YOUR LAST TREATMENT!!!!!  GOOD LUCK ON THE REST OF YOUR JOURNEY!  Today you received the following chemotherapy agents;  Taxol and Carboplatin.   To help prevent nausea and vomiting after your treatment, we encourage you to take your nausea medication as directed.    If you develop nausea and vomiting that is not controlled by your nausea medication, call the clinic.   BELOW ARE SYMPTOMS THAT SHOULD BE REPORTED IMMEDIATELY:  *FEVER GREATER THAN 100.5 F  *CHILLS WITH OR WITHOUT FEVER  NAUSEA AND VOMITING THAT IS NOT CONTROLLED WITH YOUR NAUSEA MEDICATION  *UNUSUAL SHORTNESS OF BREATH  *UNUSUAL BRUISING OR BLEEDING  TENDERNESS IN MOUTH AND THROAT WITH OR WITHOUT PRESENCE OF ULCERS  *URINARY PROBLEMS  *BOWEL PROBLEMS  UNUSUAL RASH Items with * indicate a potential emergency and should be followed up as soon as possible.  Feel free to call the clinic you have any questions or concerns. The clinic phone number is [phone].

## 2014-03-19 ENCOUNTER — Other Ambulatory Visit: Payer: Self-pay | Admitting: Adult Health

## 2014-03-20 ENCOUNTER — Encounter: Payer: Self-pay | Admitting: Adult Health

## 2014-03-21 ENCOUNTER — Other Ambulatory Visit: Payer: Self-pay | Admitting: *Deleted

## 2014-03-21 DIAGNOSIS — G629 Polyneuropathy, unspecified: Secondary | ICD-10-CM

## 2014-03-21 MED ORDER — GABAPENTIN 100 MG PO CAPS: 100.0000 mg | ORAL_CAPSULE | Freq: Three times a day (TID) | ORAL | Status: DC

## 2014-03-24 ENCOUNTER — Ambulatory Visit (INDEPENDENT_AMBULATORY_CARE_PROVIDER_SITE_OTHER): Payer: BC Managed Care – PPO | Admitting: General Surgery

## 2014-03-24 ENCOUNTER — Encounter (INDEPENDENT_AMBULATORY_CARE_PROVIDER_SITE_OTHER): Payer: Self-pay | Admitting: General Surgery

## 2014-03-24 ENCOUNTER — Telehealth (INDEPENDENT_AMBULATORY_CARE_PROVIDER_SITE_OTHER): Payer: Self-pay | Admitting: *Deleted

## 2014-03-24 VITALS — BP 130/76 | HR 68 | Temp 98.7°F | Ht 65.0 in | Wt 211.0 lb

## 2014-03-24 DIAGNOSIS — C50311 Malignant neoplasm of lower-inner quadrant of right female breast: Secondary | ICD-10-CM

## 2014-03-24 DIAGNOSIS — C50319 Malignant neoplasm of lower-inner quadrant of unspecified female breast: Secondary | ICD-10-CM

## 2014-03-24 NOTE — Patient Instructions (Signed)
Your imaging studies and physical exam suggest that you  have had a partial response to the chemotherapy.  It is time to go ahead and schedule your definitive breast surgery, as we discussed.  You will be scheduled for a right partial mastectomy with bracketed wire localization, and right axillary sentinel node biopsy.  Dr. Humphrey Rolls has stated that she would like to leave the Port-A-Cath in for now, so we will remove that at a later date.     Lumpectomy A lumpectomy is a form of "breast conserving" or "breast preservation" surgery. It may also be referred to as a partial mastectomy. During a lumpectomy, the portion of the breast that contains the cancerous tumor or breast mass (the lump) is removed. Some normal tissue around the lump may also be removed to make sure all the tumor has been removed. This surgery should take 40 minutes or less. LET [facility] CARE PROVIDER KNOW ABOUT:  Any allergies you have.  All medicines you are taking, including vitamins, herbs, eye drops, creams, and over-the-counter medicines.  Previous problems you or members of your family have had with the use of anesthetics.  Any blood disorders you have.  Previous surgeries you have had.  Medical conditions you have. RISKS AND COMPLICATIONS Generally, this is a safe procedure. However, as with any procedure, complications can occur. Possible complications include:  Bleeding.  Infection.  Pain.  Temporary swelling.  Change in the shape of the breast, particularly if a large portion is removed. BEFORE THE PROCEDURE  Ask your health care provider about changing or stopping your regular medicines.  Do not eat or drink anything for 7 8 hours before the surgery or as directed by your health care provider. Ask your health care provider if you can take a sip of water with any approved medicines.  On the day of surgery, your healthcare provider will use a mammogram or ultrasound to locate and mark the tumor in  your breast. These markings on your breast will show where the cut (incision) will be made. PROCEDURE   An IV tube will be put into one of your veins.  You may be given medicine to help you relax before the surgery (sedative). You will be given one of the following:  A medicine that numbs the area (local anesthesia).  A medicine that makes you go to sleep (general anesthesia).  Your health care provider will use a kind of electric scalpel that uses heat to minimize bleeding (electrocautery knife).  A curved incision (like a smile or frown) that follows the natural curve of your breast is made, to allow for minimal scarring and better healing.  The tumor will be removed with some of the surrounding tissue. This will be sent to the lab for analysis. Your health care provider may also remove your lymph nodes at this time if needed.  Sometimes, but not always, a rubber tube called a drain will be surgically inserted into your breast area or armpit to collect excess fluid that may accumulate in the space where the tumor was. This drain is connected to a plastic bulb on the outside of your body. This drain creates suction to help remove the fluid.  The incisions will be closed with stitches (sutures).  A bandage may be placed over the incisions. AFTER THE PROCEDURE  You will be taken to the recovery area.  You will be given medicine for pain.  A small rubber drain may be placed in the breast for 2 3  days to prevent a collection of blood (hematoma) from developing in the breast. You will be given instructions on caring for the drain before you go home.  A pressure bandage (dressing) will be applied for 1 2 days to prevent bleeding. Ask your health care provider how to care for your bandage at home. Document Released: 01/27/2007 Document Revised: 08/18/2013 Document Reviewed: 05/21/2013 [facility] Patient Information 2014 [place].

## 2014-03-24 NOTE — Telephone Encounter (Signed)
I spoke with pt and informed her of the appt for her MM and [place] to be done at [facility] scheduled for 03/28/14 @ 9:45am.  I instructed her NOT to wear any lotions, powders, or deodorant the morning of her appt.  She is agreeable with this appt.  Both orders have been faxed to [facility].

## 2014-03-24 NOTE — Progress Notes (Signed)
Patient ID: Natalie Arias, female   DOB: 06/12/1961, 53 y.o.   MRN: [id number]  Chief Complaint  Patient presents with  . Routine Post Op    HPI Natalie Arias is a 53 y.o. female.  She returns following completion of neoadjuvant chemotherapy for her right breast cancer, upper inner quadrant. She is here to discuss definitive surgical treatment planning.       Initially, she was referred by Dr. Elige Radon at [facility] health care for evaluation and management of a locally advanced invasive cancer of the right breast at the 3:00 position. Dr. Briscoe Deutscher is her primary care physician in [facility]       She was initially evaluated in the [facility] by Dr. Humphrey Rolls, Dr. Lisbeth Renshaw, and me.       She noticed a lump in her right breast in August.. She states her last mammogram was in December 2013 and nothing was seen. Mammograms showed a 3.5 cm suspicious mass in the right breast medially. Image guided biopsy shows invasive mammary carcinoma, probably invasive ductal carcinoma. This is a TNBC.Marland Kitchen  MRI shows that this is a solitary mass. Nodes looked negative by MRI.         She is interested in breast conservation. I placed a Port-A-Cath on 10/18/2013. She has completed 4 cycles of Adriamycin /Cytoxan and hast finished receiving Taxol/.Carbo.  She states that Dr. Humphrey Rolls would like to leave the port in for now.       An MRI was performed on March 3. This was prior to the end of treatment. She has a residual enhancing mass in the inner right breast measuring 3.7 x 3.2 x 2.0 cm. This is compared to prior measurements of 3.9 x 3.7 x 3.1 cm.   Family history reveals breast cancer in a maternal grandmother and a paternal aunt but no ovarian cancer.  She is doing fairly well, considering. She says she thinks that the mass in her right breast is responding and is much less palpable now.    HPI  Past Medical History  Diagnosis Date  . Asthma 20's    allergic asthema  . Seasonal allergies   . PONV (postoperative  nausea and vomiting)   . Breast cancer dx'd 10/01/2013    right; triplen negative    Past Surgical History  Procedure Laterality Date  . Laparoscopic endometriosis fulguration  1991  . Abdominal hysterectomy  1992  . Portacath placement N/A 10/18/2013    Procedure: INSERTION PORT-A-CATH WITH ULTRASOUND;  Surgeon: Adin Hector, MD;  Location: [facility];  Service: General;  Laterality: N/A;    Family History  Problem Relation Age of Onset  . Lung cancer Maternal Uncle     smoker  . Breast cancer Paternal Aunt     dx in her late 61s; maternal half sister to patient's father  . Breast cancer Maternal Grandmother 45  . Lung cancer Maternal Uncle     smoker  . Hypertension Father   . Heart disease Father   . Parkinsonism Father   . Skin cancer Father   . Hypercholesterolemia Mother   . Gallstones Mother   . Healthy Brother   . COPD Maternal Grandfather   . Lung cancer Cousin     smoker; paternal cousin    Social History History  Substance Use Topics  . Smoking status: Never Smoker   . Smokeless tobacco: Never Used  . Alcohol Use: No     Comment: once a month  Allergies  Allergen Reactions  . Penicillins Hives    Current Outpatient Prescriptions  Medication Sig Dispense Refill  . B Complex-C (SUPER B COMPLEX PO) Take 1 tablet by mouth.      . dexamethasone (DECADRON) 4 MG tablet Take 2 tablets (8 mg total) by mouth 2 (two) times daily with a meal. Take two times a day starting the day after chemotherapy for 3 days.  30 tablet  2  . gabapentin (NEURONTIN) 100 MG capsule Take 1 capsule (100 mg total) by mouth 3 (three) times daily.  90 capsule  1  . hydrocortisone (ANUSOL-HC) 2.5 % rectal cream Place 1 application rectally 2 (two) times daily.  30 g  0  . ibuprofen (ADVIL,MOTRIN) 200 MG tablet Take 200 mg by mouth every 6 (six) hours as needed for pain.      Marland Kitchen lidocaine-prilocaine (EMLA) cream Apply topically as needed. To port-a-cath 1.5 hours before chemotherapy and  cover  30 g  1  . ondansetron (ZOFRAN) 8 MG tablet Take 1 tablet (8 mg total) by mouth 2 (two) times daily. Take two times a day starting the day after chemo for 3 days. Then take two times a day as needed for nausea or vomiting.  30 tablet  1  . polyethylene glycol (MIRALAX / GLYCOLAX) packet Take 17 g by mouth daily as needed.      . prochlorperazine (COMPAZINE) 10 MG tablet Take 1 tablet (10 mg total) by mouth every 6 (six) hours as needed (Nausea or vomiting).  30 tablet  1  . SENNA PO Take by mouth.      Marland Kitchen UNABLE TO FIND 1 each by Other route as needed. Cranial Prosthesis       No current facility-administered medications for this visit.    Review of Systems Review of Systems  Constitutional: Negative for fever, chills and unexpected weight change.  HENT: Negative for congestion, hearing loss, sore throat, trouble swallowing and voice change.        Alopecia  Eyes: Negative for visual disturbance.  Respiratory: Negative for cough and wheezing.   Cardiovascular: Negative for chest pain, palpitations and leg swelling.  Gastrointestinal: Negative for nausea, vomiting, abdominal pain, diarrhea, constipation, blood in stool, abdominal distention and anal bleeding.  Genitourinary: Negative for hematuria, vaginal bleeding and difficulty urinating.  Musculoskeletal: Negative for arthralgias.  Skin: Negative for rash and wound.  Neurological: Negative for seizures, syncope and headaches.  Hematological: Negative for adenopathy. Does not bruise/bleed easily.  Psychiatric/Behavioral: Negative for confusion.    Blood pressure 130/76, pulse 68, temperature 98.7 F (37.1 C), temperature source Oral, height 5\' 5"  (1.651 m), weight 211 lb (95.709 kg).  Physical Exam Physical Exam  Constitutional: She is oriented to person, place, and time. She appears well-developed and well-nourished. No distress.  HENT:  Head: Normocephalic and atraumatic.  Nose: Nose normal.  Mouth/Throat: No  oropharyngeal exudate.  Eyes: Conjunctivae and EOM are normal. Pupils are equal, round, and reactive to light. Left eye exhibits no discharge. No scleral icterus.  Neck: Neck supple. No JVD present. No tracheal deviation present. No thyromegaly present.  Cardiovascular: Normal rate, regular rhythm, normal heart sounds and intact distal pulses.   No murmur heard. Pulmonary/Chest: Effort normal and breath sounds normal. No respiratory distress. She has no wheezes. She has no rales. She exhibits no tenderness.    Breasts are medium to medium large size. The palpable mass at the 1:30 position of the left breast, less distinct than it used to  be, elliptical with a long axis radially oriented.no axillary adenopathy  Abdominal: Soft. Bowel sounds are normal. She exhibits no distension and no mass. There is no tenderness. There is no rebound and no guarding.  Musculoskeletal: She exhibits no edema and no tenderness.  Lymphadenopathy:    She has no cervical adenopathy.  Neurological: She is alert and oriented to person, place, and time. She exhibits normal muscle tone. Coordination normal.  Skin: Skin is warm. No rash noted. She is not diaphoretic. No erythema. No pallor.  Psychiatric: She has a normal mood and affect. Her behavior is normal. Judgment and thought content normal.    Data Reviewed My prior office records. [facility] records. Imaging studies. Treatment planning discussion with Dr. Christene Slates.  Assessment    Invasive mammary carcinoma right breast, upper inner quadrant, triple negative breast cancer. Initial stage cT2, N0   Clinically, she has had a partial response to neoadjuvant chemotherapy.I think that we will probably have to remove at least a 6 cm area of tissue to get good margins. Considering her breast volume I think that this is doable although there will be some volume loss, and I discussed that with her. She still wants to try for breast conservation..    25 pound weight gain in one year.     Plan    The patient will be scheduled for right partial mastectomy with bracketed needle localization and sentinel lymph node biopsy.  She will be sent to [facility] mammography now for preoperative planning ultrasound and mammogram. Dr. Marcelo Baldy will supervise this.  Her Port-A-Cath will be left in for now  I discussed the indications, details, techniques, and numerous risk of the surgery with the patient and her family. She is aware of the risk of bleeding, infection, cosmetic deformity, reoperation for positive margins, arm numbness, or swelling, cardiac, pulmonary and thromboembolic problems. She understands all these issues. At this time all her questions were answered. She agrees with this plan.        Edsel Petrin. Dalbert Batman, M.D., [facility] General and Minimally invasive Surgery Breast and Colorectal Surgery Office:   [phone] Pager:   [phone]  03/24/2014, 12:54 PM

## 2014-03-25 ENCOUNTER — Other Ambulatory Visit: Payer: BC Managed Care – PPO

## 2014-03-25 DIAGNOSIS — C50311 Malignant neoplasm of lower-inner quadrant of right female breast: Secondary | ICD-10-CM

## 2014-03-25 DIAGNOSIS — C50219 Malignant neoplasm of upper-inner quadrant of unspecified female breast: Secondary | ICD-10-CM

## 2014-03-25 LAB — COMPREHENSIVE METABOLIC PANEL (CC13)
ALT: 37 U/L (ref 0–55)
AST: 20 U/L (ref 5–34)
Albumin: 3.5 g/dL (ref 3.5–5.0)
Alkaline Phosphatase: 61 U/L (ref 40–150)
Anion Gap: 10 mEq/L (ref 3–11)
BUN: 14.1 mg/dL (ref 7.0–26.0)
CALCIUM: 9.3 mg/dL (ref 8.4–10.4)
CO2: 24 mEq/L (ref 22–29)
CREATININE: 0.7 mg/dL (ref 0.6–1.1)
Chloride: 110 mEq/L — ABNORMAL HIGH (ref 98–109)
Glucose: 118 mg/dl (ref 70–140)
Potassium: 4.1 mEq/L (ref 3.5–5.1)
Sodium: 144 mEq/L (ref 136–145)
TOTAL PROTEIN: 6 g/dL — AB (ref 6.4–8.3)
Total Bilirubin: 0.44 mg/dL (ref 0.20–1.20)

## 2014-03-25 LAB — CBC WITH DIFFERENTIAL/PLATELET
BASO%: 0.3 % (ref 0.0–2.0)
Basophils Absolute: 0 10*3/uL (ref 0.0–0.1)
EOS%: 0.6 % (ref 0.0–7.0)
Eosinophils Absolute: 0 10*3/uL (ref 0.0–0.5)
HEMATOCRIT: 27.6 % — AB (ref 34.8–46.6)
HEMOGLOBIN: 9.2 g/dL — AB (ref 11.6–15.9)
LYMPH%: 27.7 % (ref 14.0–49.7)
MCH: 30.3 pg (ref 25.1–34.0)
MCHC: 33.3 g/dL (ref 31.5–36.0)
MCV: 90.8 fL (ref 79.5–101.0)
MONO#: 0.3 10*3/uL (ref 0.1–0.9)
MONO%: 7.3 % (ref 0.0–14.0)
NEUT#: 2.2 10*3/uL (ref 1.5–6.5)
NEUT%: 64.1 % (ref 38.4–76.8)
NRBC: 3 % — AB (ref 0–0)
PLATELETS: 155 10*3/uL (ref 145–400)
RBC: 3.04 10*6/uL — AB (ref 3.70–5.45)
RDW: 17.7 % — ABNORMAL HIGH (ref 11.2–14.5)
WBC: 3.4 10*3/uL — AB (ref 3.9–10.3)
lymph#: 1 10*3/uL (ref 0.9–3.3)

## 2014-03-29 NOTE — Progress Notes (Signed)
Report rcvd [facility] dtd 03/28/14.  Provided to [place].

## 2014-03-30 ENCOUNTER — Encounter: Payer: Self-pay | Admitting: Adult Health

## 2014-03-31 ENCOUNTER — Encounter: Payer: Self-pay | Admitting: Adult Health

## 2014-04-04 ENCOUNTER — Telehealth (INDEPENDENT_AMBULATORY_CARE_PROVIDER_SITE_OTHER): Payer: Self-pay

## 2014-04-04 ENCOUNTER — Encounter: Payer: Self-pay | Admitting: Adult Health

## 2014-04-04 ENCOUNTER — Encounter (HOSPITAL_BASED_OUTPATIENT_CLINIC_OR_DEPARTMENT_OTHER): Payer: Self-pay | Admitting: *Deleted

## 2014-04-04 NOTE — Telephone Encounter (Signed)
UA not needed in this case.  hmi

## 2014-04-04 NOTE — Telephone Encounter (Signed)
Natalie Arias with pre op is asking if a UA is needed, she  had labs done on 03-25-14 so just a UA is  to be collected.  Please advise [phone]

## 2014-04-04 NOTE — Progress Notes (Signed)
Had labs cc-03/25/14-dr ingram ordered u/a-called office to see if he needs that-only thing she would need to come in for. She is Psychologist, sport and exercise now since [employer]

## 2014-04-05 ENCOUNTER — Telehealth: Payer: Self-pay

## 2014-04-05 NOTE — Telephone Encounter (Signed)
MyChart dtd 04/04/14.  LMOVM for pt to return call.

## 2014-04-05 NOTE — Telephone Encounter (Signed)
Patient reports swelling in ankles and feet, can't wear some of her shoes, sometimes painful.  Denies chest pain or shortness of breath.  Per LC, ok for patient to discontinue the neurontin.  Pt advised to call immediately or go to ED for any SOB or chest pain, and to let us know if discontinuing the neurontin does not help the swelling.  Pt voiced understanding.

## 2014-04-06 ENCOUNTER — Encounter (INDEPENDENT_AMBULATORY_CARE_PROVIDER_SITE_OTHER): Payer: BC Managed Care – PPO | Admitting: General Surgery

## 2014-04-06 NOTE — H&P (Signed)
Natalie Arias   MRN:  [id number]   Description: 53 year old female  Provider: Adin Hector, MD  Department: [facility]               Diagnoses      Breast cancer of lower-inner quadrant of right female breast    -  Primary      174.3              Current Vitals Most recent update: 03/24/2014 10:29 AM by [place], CMA      BP Pulse Temp(Src) Ht Wt BMI      130/76 68 98.7 F (37.1 C) (Oral) 5\' 5"  (1.651 m) 211 lb (95.709 kg) 35.11 kg/m2        Vitals History Recorded           History and Physical   Adin Hector, MD       Status: Signed            Patient ID: Natalie Arias, female   DOB: 1961/10/07, 53 y.o.   MRN: [id number]               HPI Natalie Arias is a 54 y.o. female.  She returns following completion of neoadjuvant chemotherapy for her right breast cancer, upper inner quadrant. She is here to discuss definitive surgical treatment planning.        Initially, she was referred by Dr. Elige Radon at [facility] health care for evaluation and management of a locally advanced invasive cancer of the right breast at the 3:00 position. Dr. Briscoe Deutscher is her primary care physician in [facility]        She was initially evaluated in the [facility] by Dr. Humphrey Rolls, Dr. Lisbeth Renshaw, and me.        She noticed a lump in her right breast in August.. She states her last mammogram was in December 2013 and nothing was seen. Mammograms showed a 3.5 cm suspicious mass in the right breast medially. Image guided biopsy shows invasive mammary carcinoma, probably invasive ductal carcinoma. This is a TNBC.Marland Kitchen   MRI shows that this is a solitary mass. Nodes looked negative by MRI.          She is interested in breast conservation. I placed a Port-A-Cath on 10/18/2013. She has completed 4 cycles of Adriamycin /Cytoxan and hast finished receiving Taxol/.Carbo.  She states that Dr. Humphrey Rolls would like to leave the port in for now.        An MRI was performed on March 3. This was  prior to the end of treatment. She has a residual enhancing mass in the inner right breast measuring 3.7 x 3.2 x 2.0 cm. This is compared to prior measurements of 3.9 x 3.7 x 3.1 cm.    Family history reveals breast cancer in a maternal grandmother and a paternal aunt but no ovarian cancer.   She is doing fairly well, considering. She says she thinks that the mass in her right breast is responding and is much less palpable now.           Past Medical History   Diagnosis  Date   .  Asthma  20's       allergic asthema   .  Seasonal allergies     .  PONV (postoperative nausea and vomiting)     .  Breast cancer  dx'd 10/01/2013       right; triplen negative  Past Surgical History   Procedure  Laterality  Date   .  Laparoscopic endometriosis fulguration    1991   .  Abdominal hysterectomy    1992   .  Portacath placement  N/A  10/18/2013       Procedure: INSERTION PORT-A-CATH WITH ULTRASOUND;  Surgeon: Adin Hector, MD;  Location: [facility];  Service: General;  Laterality: N/A;         Family History   Problem  Relation  Age of Onset   .  Lung cancer  Maternal Uncle         smoker   .  Breast cancer  Paternal Aunt         dx in her late 8s; maternal half sister to patient's father   .  Breast cancer  Maternal Grandmother  64   .  Lung cancer  Maternal Uncle         smoker   .  Hypertension  Father     .  Heart disease  Father     .  Parkinsonism  Father     .  Skin cancer  Father     .  Hypercholesterolemia  Mother     .  Gallstones  Mother     .  Healthy  Brother     .  COPD  Maternal Grandfather     .  Lung cancer  Cousin         smoker; paternal cousin        Social History History   Substance Use Topics   .  Smoking status:  Never Smoker    .  Smokeless tobacco:  Never Used   .  Alcohol Use:  No         Comment: once a month         Allergies   Allergen  Reactions   .  Penicillins  Hives         Current Outpatient Prescriptions    Medication  Sig  Dispense  Refill   .  B Complex-C (SUPER B COMPLEX PO)  Take 1 tablet by mouth.         .  dexamethasone (DECADRON) 4 MG tablet  Take 2 tablets (8 mg total) by mouth 2 (two) times daily with a meal. Take two times a day starting the day after chemotherapy for 3 days.   30 tablet   2   .  gabapentin (NEURONTIN) 100 MG capsule  Take 1 capsule (100 mg total) by mouth 3 (three) times daily.   90 capsule   1   .  hydrocortisone (ANUSOL-HC) 2.5 % rectal cream  Place 1 application rectally 2 (two) times daily.   30 g   0   .  ibuprofen (ADVIL,MOTRIN) 200 MG tablet  Take 200 mg by mouth every 6 (six) hours as needed for pain.         Marland Kitchen  lidocaine-prilocaine (EMLA) cream  Apply topically as needed. To port-a-cath 1.5 hours before chemotherapy and cover   30 g   1   .  ondansetron (ZOFRAN) 8 MG tablet  Take 1 tablet (8 mg total) by mouth 2 (two) times daily. Take two times a day starting the day after chemo for 3 days. Then take two times a day as needed for nausea or vomiting.   30 tablet   1   .  polyethylene glycol (MIRALAX / GLYCOLAX) packet  Take 17 g by mouth  daily as needed.         .  prochlorperazine (COMPAZINE) 10 MG tablet  Take 1 tablet (10 mg total) by mouth every 6 (six) hours as needed (Nausea or vomiting).   30 tablet   1   .  SENNA PO  Take by mouth.         Marland Kitchen  UNABLE TO FIND  1 each by Other route as needed. Cranial Prosthesis                     Review of Systems   Constitutional: Negative for fever, chills and unexpected weight change.  HENT: Negative for congestion, hearing loss, sore throat, trouble swallowing and voice change.         Alopecia  Eyes: Negative for visual disturbance.  Respiratory: Negative for cough and wheezing.   Cardiovascular: Negative for chest pain, palpitations and leg swelling.  Gastrointestinal: Negative for nausea, vomiting, abdominal pain, diarrhea, constipation, blood in stool, abdominal distention and anal bleeding.   Genitourinary: Negative for hematuria, vaginal bleeding and difficulty urinating.  Musculoskeletal: Negative for arthralgias.  Skin: Negative for rash and wound.  Neurological: Negative for seizures, syncope and headaches.  Hematological: Negative for adenopathy. Does not bruise/bleed easily.  Psychiatric/Behavioral: Negative for confusion.      Blood pressure 130/76, pulse 68, temperature 98.7 F (37.1 C), temperature source Oral, height 5\' 5"  (1.651 m), weight 211 lb (95.709 kg).   Physical Exam  Constitutional: She is oriented to person, place, and time. She appears well-developed and well-nourished. No distress.  HENT:   Head: Normocephalic and atraumatic.   Nose: Nose normal.   Mouth/Throat: No oropharyngeal exudate.  Eyes: Conjunctivae and EOM are normal. Pupils are equal, round, and reactive to light. Left eye exhibits no discharge. No scleral icterus.  Neck: Neck supple. No JVD present. No tracheal deviation present. No thyromegaly present.  Cardiovascular: Normal rate, regular rhythm, normal heart sounds and intact distal pulses.    No murmur heard. Pulmonary/Chest: Effort normal and breath sounds normal. No respiratory distress. She has no wheezes. She has no rales. She exhibits no tenderness.    Breasts are medium to medium large size. The palpable mass at the 1:30 position of the left breast, less distinct than it used to be, elliptical with a long axis radially oriented.no axillary adenopathy  Abdominal: Soft. Bowel sounds are normal. She exhibits no distension and no mass. There is no tenderness. There is no rebound and no guarding.  Musculoskeletal: She exhibits no edema and no tenderness.  Lymphadenopathy:    She has no cervical adenopathy.  Neurological: She is alert and oriented to person, place, and time. She exhibits normal muscle tone. Coordination normal.  Skin: Skin is warm. No rash noted. She is not diaphoretic. No erythema. No pallor.  Psychiatric: She  has a normal mood and affect. Her behavior is normal. Judgment and thought content normal.      Data Reviewed My prior office records. [facility] records. Imaging studies. Treatment planning discussion with Dr. Christene Slates.   Assessment     Invasive mammary carcinoma right breast, upper inner quadrant, triple negative breast cancer. Initial stage cT2, N0    Clinically, she has had a partial response to neoadjuvant chemotherapy.I think that we will probably have to remove at least a 6 cm area of tissue to get good margins. Considering her breast volume I think that this is doable although there will be some volume loss,  and I discussed that with her. She still wants to try for breast conservation..    25 pound weight gain in one year.       Plan    The patient will be scheduled for right partial mastectomy with bracketed needle localization and sentinel lymph node biopsy.   She will be sent to [facility] mammography now for preoperative planning ultrasound and mammogram. Dr. Marcelo Baldy will supervise this.   Her Port-A-Cath will be left in for now   I discussed the indications, details, techniques, and numerous risk of the surgery with the patient and her family. She is aware of the risk of bleeding, infection, cosmetic deformity, reoperation for positive margins, arm numbness, or swelling, cardiac, pulmonary and thromboembolic problems. She understands all these issues. At this time all her questions were answered. She agrees with this plan.          Natalie Arias. Dalbert Batman, M.D., [facility] General and Minimally invasive Surgery Breast and Colorectal Surgery Office:   [phone] Pager:   [phone]

## 2014-04-08 ENCOUNTER — Encounter (HOSPITAL_BASED_OUTPATIENT_CLINIC_OR_DEPARTMENT_OTHER)
Admission: RE | Disposition: A | Discharge status: Home / Self Care | Payer: Self-pay | Source: Ambulatory Visit | Attending: General Surgery

## 2014-04-08 ENCOUNTER — Encounter (HOSPITAL_COMMUNITY)
Admission: RE | Admit: 2014-04-08 | Discharge: 2014-04-08 | Disposition: A | Discharge status: Home / Self Care | Payer: BC Managed Care – PPO | Source: Ambulatory Visit | Attending: General Surgery | Admitting: General Surgery

## 2014-04-08 ENCOUNTER — Encounter (HOSPITAL_BASED_OUTPATIENT_CLINIC_OR_DEPARTMENT_OTHER): Payer: Self-pay | Admitting: Certified Registered"

## 2014-04-08 ENCOUNTER — Encounter (HOSPITAL_BASED_OUTPATIENT_CLINIC_OR_DEPARTMENT_OTHER): Payer: BC Managed Care – PPO | Admitting: Certified Registered"

## 2014-04-08 ENCOUNTER — Ambulatory Visit (HOSPITAL_BASED_OUTPATIENT_CLINIC_OR_DEPARTMENT_OTHER)
Admission: RE | Admit: 2014-04-08 | Discharge: 2014-04-08 | Disposition: A | Discharge status: Home / Self Care | Payer: BC Managed Care – PPO | Source: Ambulatory Visit | Attending: General Surgery | Admitting: General Surgery

## 2014-04-08 ENCOUNTER — Ambulatory Visit (HOSPITAL_BASED_OUTPATIENT_CLINIC_OR_DEPARTMENT_OTHER): Payer: BC Managed Care – PPO | Admitting: Certified Registered"

## 2014-04-08 DIAGNOSIS — C50319 Malignant neoplasm of lower-inner quadrant of unspecified female breast: Secondary | ICD-10-CM | POA: Insufficient documentation

## 2014-04-08 DIAGNOSIS — Z803 Family history of malignant neoplasm of breast: Secondary | ICD-10-CM | POA: Insufficient documentation

## 2014-04-08 DIAGNOSIS — D0590 Unspecified type of carcinoma in situ of unspecified breast: Secondary | ICD-10-CM | POA: Insufficient documentation

## 2014-04-08 DIAGNOSIS — C50311 Malignant neoplasm of lower-inner quadrant of right female breast: Secondary | ICD-10-CM

## 2014-04-08 DIAGNOSIS — C50219 Malignant neoplasm of upper-inner quadrant of unspecified female breast: Secondary | ICD-10-CM | POA: Insufficient documentation

## 2014-04-08 DIAGNOSIS — Z9221 Personal history of antineoplastic chemotherapy: Secondary | ICD-10-CM | POA: Insufficient documentation

## 2014-04-08 DIAGNOSIS — J45909 Unspecified asthma, uncomplicated: Secondary | ICD-10-CM | POA: Insufficient documentation

## 2014-04-08 HISTORY — PX: PARTIAL MASTECTOMY WITH NEEDLE LOCALIZATION AND AXILLARY SENTINEL LYMPH NODE BX: SHX6009

## 2014-04-08 SURGERY — PARTIAL MASTECTOMY WITH NEEDLE LOCALIZATION AND AXILLARY SENTINEL LYMPH NODE BX
Anesthesia: General | Site: Breast | Laterality: Right

## 2014-04-08 MED ORDER — ONDANSETRON HCL 4 MG/2ML IJ SOLN
INTRAMUSCULAR | Status: DC | PRN
  Administered 2014-04-08: 4 mg via INTRAVENOUS

## 2014-04-08 MED ORDER — MIDAZOLAM HCL 2 MG/2ML IJ SOLN
INTRAMUSCULAR | Status: AC
  Filled 2014-04-08: qty 2

## 2014-04-08 MED ORDER — 0.9 % SODIUM CHLORIDE (POUR BTL) OPTIME
TOPICAL | Status: DC | PRN
  Administered 2014-04-08: 800 mL

## 2014-04-08 MED ORDER — HYDROMORPHONE HCL PF 1 MG/ML IJ SOLN: 0.2500 mg | INTRAMUSCULAR | Status: DC | PRN

## 2014-04-08 MED ORDER — OXYCODONE HCL 5 MG/5ML PO SOLN: 5.0000 mg | Freq: Once | ORAL | Status: AC | PRN

## 2014-04-08 MED ORDER — ACETAMINOPHEN 650 MG RE SUPP: 650.0000 mg | RECTAL | Status: DC | PRN

## 2014-04-08 MED ORDER — OXYCODONE-ACETAMINOPHEN 7.5-325 MG PO TABS: 1.0000 | ORAL_TABLET | ORAL | Status: DC | PRN

## 2014-04-08 MED ORDER — SODIUM CHLORIDE 0.9 % IJ SOLN
INTRAMUSCULAR | Status: AC
  Filled 2014-04-08: qty 10

## 2014-04-08 MED ORDER — FENTANYL CITRATE 0.05 MG/ML IJ SOLN
INTRAMUSCULAR | Status: AC
  Filled 2014-04-08: qty 2

## 2014-04-08 MED ORDER — LIDOCAINE HCL (CARDIAC) 20 MG/ML IV SOLN
INTRAVENOUS | Status: DC | PRN
  Administered 2014-04-08: 30 mg via INTRAVENOUS

## 2014-04-08 MED ORDER — SODIUM CHLORIDE 0.9 % IV SOLN: INTRAVENOUS | Status: DC

## 2014-04-08 MED ORDER — PROPOFOL 10 MG/ML IV BOLUS
INTRAVENOUS | Status: DC | PRN
  Administered 2014-04-08: 150 mg via INTRAVENOUS

## 2014-04-08 MED ORDER — SODIUM CHLORIDE 0.9 % IJ SOLN: 3.0000 mL | INTRAMUSCULAR | Status: DC | PRN

## 2014-04-08 MED ORDER — SCOPOLAMINE 1 MG/3DAYS TD PT72
MEDICATED_PATCH | TRANSDERMAL | Status: AC
  Filled 2014-04-08: qty 1

## 2014-04-08 MED ORDER — OXYCODONE HCL 5 MG PO TABS: 5.0000 mg | ORAL_TABLET | ORAL | Status: DC | PRN

## 2014-04-08 MED ORDER — ACETAMINOPHEN 325 MG PO TABS: 650.0000 mg | ORAL_TABLET | ORAL | Status: DC | PRN

## 2014-04-08 MED ORDER — BUPIVACAINE-EPINEPHRINE PF 0.5-1:200000 % IJ SOLN
INTRAMUSCULAR | Status: AC
  Filled 2014-04-08: qty 30

## 2014-04-08 MED ORDER — DEXAMETHASONE SODIUM PHOSPHATE 4 MG/ML IJ SOLN
INTRAMUSCULAR | Status: DC | PRN
  Administered 2014-04-08: 10 mg via INTRAVENOUS

## 2014-04-08 MED ORDER — FENTANYL CITRATE 0.05 MG/ML IJ SOLN
INTRAMUSCULAR | Status: DC | PRN
  Administered 2014-04-08: 50 ug via INTRAVENOUS
  Administered 2014-04-08: 25 ug via INTRAVENOUS
  Administered 2014-04-08: 50 ug via INTRAVENOUS

## 2014-04-08 MED ORDER — OXYCODONE HCL 5 MG PO TABS
ORAL_TABLET | ORAL | Status: AC
  Filled 2014-04-08: qty 1

## 2014-04-08 MED ORDER — ONDANSETRON HCL 4 MG/2ML IJ SOLN: 4.0000 mg | Freq: Once | INTRAMUSCULAR | Status: DC | PRN

## 2014-04-08 MED ORDER — SODIUM CHLORIDE 0.9 % IJ SOLN: 3.0000 mL | Freq: Two times a day (BID) | INTRAMUSCULAR | Status: DC

## 2014-04-08 MED ORDER — VANCOMYCIN HCL IN DEXTROSE 1-5 GM/200ML-% IV SOLN
1000.0000 mg | INTRAVENOUS | Status: AC
  Administered 2014-04-08: 1000 mg via INTRAVENOUS

## 2014-04-08 MED ORDER — TECHNETIUM TC 99M SULFUR COLLOID FILTERED
1.0000 | Freq: Once | INTRAVENOUS | Status: AC | PRN
  Administered 2014-04-08: 1 via INTRADERMAL

## 2014-04-08 MED ORDER — MIDAZOLAM HCL 5 MG/5ML IJ SOLN
INTRAMUSCULAR | Status: DC | PRN
  Administered 2014-04-08: 2 mg via INTRAVENOUS

## 2014-04-08 MED ORDER — VANCOMYCIN HCL IN DEXTROSE 1-5 GM/200ML-% IV SOLN
INTRAVENOUS | Status: AC
  Filled 2014-04-08: qty 200

## 2014-04-08 MED ORDER — BUPIVACAINE-EPINEPHRINE 0.5% -1:200000 IJ SOLN
INTRAMUSCULAR | Status: DC | PRN
  Administered 2014-04-08: 20 mL

## 2014-04-08 MED ORDER — SODIUM CHLORIDE 0.9 % IV SOLN: 250.0000 mL | INTRAVENOUS | Status: DC | PRN

## 2014-04-08 MED ORDER — LACTATED RINGERS IV SOLN
INTRAVENOUS | Status: DC
  Administered 2014-04-08 (×2): via INTRAVENOUS

## 2014-04-08 MED ORDER — BUPIVACAINE-EPINEPHRINE PF 0.5-1:200000 % IJ SOLN
INTRAMUSCULAR | Status: DC | PRN
  Administered 2014-04-08: 20 mL via PERINEURAL

## 2014-04-08 MED ORDER — SCOPOLAMINE 1 MG/3DAYS TD PT72
1.0000 | MEDICATED_PATCH | TRANSDERMAL | Status: DC
  Administered 2014-04-08: 1.5 mg via TRANSDERMAL

## 2014-04-08 MED ORDER — FENTANYL CITRATE 0.05 MG/ML IJ SOLN
INTRAMUSCULAR | Status: AC
  Filled 2014-04-08: qty 6

## 2014-04-08 MED ORDER — CHLORHEXIDINE GLUCONATE 4 % EX LIQD: 1.0000 "application " | Freq: Once | CUTANEOUS | Status: DC

## 2014-04-08 MED ORDER — ISOSULFAN BLUE 1 % ~~LOC~~ SOLN
SUBCUTANEOUS | Status: DC | PRN
  Administered 2014-04-08: 5 mL via SUBCUTANEOUS

## 2014-04-08 MED ORDER — MIDAZOLAM HCL 2 MG/2ML IJ SOLN
1.0000 mg | INTRAMUSCULAR | Status: DC | PRN
  Administered 2014-04-08: 2 mg via INTRAVENOUS

## 2014-04-08 MED ORDER — FENTANYL CITRATE 0.05 MG/ML IJ SOLN: 25.0000 ug | INTRAMUSCULAR | Status: DC | PRN

## 2014-04-08 MED ORDER — OXYCODONE HCL 5 MG PO TABS
5.0000 mg | ORAL_TABLET | Freq: Once | ORAL | Status: AC | PRN
  Administered 2014-04-08: 5 mg via ORAL

## 2014-04-08 MED ORDER — FENTANYL CITRATE 0.05 MG/ML IJ SOLN
50.0000 ug | INTRAMUSCULAR | Status: DC | PRN
  Administered 2014-04-08: 100 ug via INTRAVENOUS

## 2014-04-08 SURGICAL SUPPLY — 68 items
APPLIER CLIP 11 MED OPEN (CLIP) ×3
BANDAGE ELASTIC 6 VELCRO ST LF (GAUZE/BANDAGES/DRESSINGS) IMPLANT
BENZOIN TINCTURE PRP APPL 2/3 (GAUZE/BANDAGES/DRESSINGS) IMPLANT
BINDER BREAST XXLRG (GAUZE/BANDAGES/DRESSINGS) ×3 IMPLANT
BLADE HEX COATED 2.75 (ELECTRODE) ×3 IMPLANT
BLADE SURG 10 STRL SS (BLADE) IMPLANT
BLADE SURG 15 STRL LF DISP TIS (BLADE) ×2 IMPLANT
BLADE SURG 15 STRL SS (BLADE) ×4
CANISTER SUCT 1200ML W/VALVE (MISCELLANEOUS) ×3 IMPLANT
CHLORAPREP W/TINT 26ML (MISCELLANEOUS) ×3 IMPLANT
CLIP APPLIE 11 MED OPEN (CLIP) ×1 IMPLANT
CLOSURE WOUND 1/2 X4 (GAUZE/BANDAGES/DRESSINGS)
COVER MAYO STAND STRL (DRAPES) ×3 IMPLANT
COVER PROBE W GEL 5X96 (DRAPES) ×3 IMPLANT
COVER TABLE BACK 60X90 (DRAPES) ×3 IMPLANT
DECANTER SPIKE VIAL GLASS SM (MISCELLANEOUS) IMPLANT
DERMABOND ADVANCED (GAUZE/BANDAGES/DRESSINGS) ×4
DERMABOND ADVANCED .7 DNX12 (GAUZE/BANDAGES/DRESSINGS) ×2 IMPLANT
DEVICE DUBIN W/COMP PLATE 8390 (MISCELLANEOUS) ×3 IMPLANT
DRAIN CHANNEL 19F RND (DRAIN) IMPLANT
DRAIN HEMOVAC 1/8 X 5 (WOUND CARE) IMPLANT
DRAPE LAPAROSCOPIC ABDOMINAL (DRAPES) ×3 IMPLANT
DRAPE UTILITY XL STRL (DRAPES) ×3 IMPLANT
DRSG PAD ABDOMINAL 8X10 ST (GAUZE/BANDAGES/DRESSINGS) ×6 IMPLANT
ELECT REM PT RETURN 9FT ADLT (ELECTROSURGICAL) ×3
ELECTRODE REM PT RTRN 9FT ADLT (ELECTROSURGICAL) ×1 IMPLANT
EVACUATOR SILICONE 100CC (DRAIN) IMPLANT
GAUZE SPONGE 4X4 16PLY XRAY LF (GAUZE/BANDAGES/DRESSINGS) IMPLANT
GLOVE BIOGEL PI IND STRL 7.5 (GLOVE) ×2 IMPLANT
GLOVE BIOGEL PI INDICATOR 7.5 (GLOVE) ×4
GLOVE EUDERMIC 7 POWDERFREE (GLOVE) ×6 IMPLANT
GLOVE SURG SS PI 7.5 STRL IVOR (GLOVE) ×6 IMPLANT
GOWN STRL REUS W/ TWL LRG LVL3 (GOWN DISPOSABLE) IMPLANT
GOWN STRL REUS W/ TWL XL LVL3 (GOWN DISPOSABLE) ×3 IMPLANT
GOWN STRL REUS W/TWL LRG LVL3 (GOWN DISPOSABLE)
GOWN STRL REUS W/TWL XL LVL3 (GOWN DISPOSABLE) ×6
KIT MARKER MARGIN INK (KITS) ×3 IMPLANT
NDL SAFETY ECLIPSE 18X1.5 (NEEDLE) ×1 IMPLANT
NEEDLE HYPO 18GX1.5 SHARP (NEEDLE) ×2
NEEDLE HYPO 22GX1.5 SAFETY (NEEDLE) ×3 IMPLANT
NEEDLE HYPO 25X1 1.5 SAFETY (NEEDLE) ×3 IMPLANT
NS IRRIG 1000ML POUR BTL (IV SOLUTION) ×3 IMPLANT
PACK BASIN DAY SURGERY FS (CUSTOM PROCEDURE TRAY) ×3 IMPLANT
PAD ALCOHOL SWAB (MISCELLANEOUS) ×3 IMPLANT
PENCIL BUTTON HOLSTER BLD 10FT (ELECTRODE) ×3 IMPLANT
PIN SAFETY STERILE (MISCELLANEOUS) IMPLANT
SHEET MEDIUM DRAPE 40X70 STRL (DRAPES) ×3 IMPLANT
SLEEVE SCD COMPRESS KNEE MED (MISCELLANEOUS) ×3 IMPLANT
SPONGE GAUZE 4X4 12PLY (GAUZE/BANDAGES/DRESSINGS) ×3 IMPLANT
SPONGE GAUZE 4X4 12PLY STER LF (GAUZE/BANDAGES/DRESSINGS) IMPLANT
SPONGE LAP 18X18 X RAY DECT (DISPOSABLE) IMPLANT
SPONGE LAP 4X18 X RAY DECT (DISPOSABLE) ×3 IMPLANT
STRIP CLOSURE SKIN 1/2X4 (GAUZE/BANDAGES/DRESSINGS) IMPLANT
SUT ETHILON 3 0 FSL (SUTURE) IMPLANT
SUT MNCRL AB 4-0 PS2 18 (SUTURE) ×6 IMPLANT
SUT SILK 2 0 SH (SUTURE) ×3 IMPLANT
SUT VIC AB 2-0 CT1 27 (SUTURE)
SUT VIC AB 2-0 CT1 TAPERPNT 27 (SUTURE) IMPLANT
SUT VIC AB 3-0 SH 27 (SUTURE)
SUT VIC AB 3-0 SH 27X BRD (SUTURE) IMPLANT
SUT VICRYL 3-0 CR8 SH (SUTURE) ×6 IMPLANT
SYR 20CC LL (SYRINGE) ×3 IMPLANT
SYR CONTROL 10ML LL (SYRINGE) ×3 IMPLANT
TOWEL OR 17X24 6PK STRL BLUE (TOWEL DISPOSABLE) ×3 IMPLANT
TOWEL OR NON WOVEN STRL DISP B (DISPOSABLE) ×3 IMPLANT
TUBE CONNECTING 20'X1/4 (TUBING) ×1
TUBE CONNECTING 20X1/4 (TUBING) ×2 IMPLANT
YANKAUER SUCT BULB TIP NO VENT (SUCTIONS) ×3 IMPLANT

## 2014-04-08 NOTE — Anesthesia Preprocedure Evaluation (Signed)
Anesthesia Evaluation  Patient identified by MRN, date of birth, ID band Patient awake    Reviewed: Allergy & Precautions, H&P , NPO status , Patient's Chart, lab work & pertinent test results  History of Anesthesia Complications (+) PONV  Airway Mallampati: I TM Distance: >3 FB Neck ROM: Full    Dental  (+) Teeth Intact   Pulmonary  breath sounds clear to auscultation        Cardiovascular Rhythm:Regular Rate:Normal     Neuro/Psych    GI/Hepatic   Endo/Other  Morbid obesity  Renal/GU      Musculoskeletal   Abdominal   Peds  Hematology   Anesthesia Other Findings   Reproductive/Obstetrics                           Anesthesia Physical Anesthesia Plan  ASA: II  Anesthesia Plan: General   Post-op Pain Management:    Induction: Intravenous  Airway Management Planned: LMA  Additional Equipment:   Intra-op Plan:   Post-operative Plan: Extubation in OR  Informed Consent: I have reviewed the patients History and Physical, chart, labs and discussed the procedure including the risks, benefits and alternatives for the proposed anesthesia with the patient or authorized representative who has indicated his/her understanding and acceptance.   Dental advisory given  Plan Discussed with: CRNA, Anesthesiologist and Surgeon  Anesthesia Plan Comments:         Anesthesia Quick Evaluation

## 2014-04-08 NOTE — Interval H&P Note (Signed)
History and Physical Interval Note:  04/08/2014 11:17 AM  Natalie Arias  has presented today for surgery, with the diagnosis of RIGHT BREAST CANCER  The various methods of treatment have been discussed with the patient and family. After consideration of risks, benefits and other options for treatment, the patient has consented to  Procedure(s): PARTIAL MASTECTOMY WITH NEEDLE LOCALIZATION AND AXILLARY SENTINEL LYMPH NODE BX (Right) as a surgical intervention .  The patient's history has been reviewed, patient examined, no change in status, stable for surgery.  I have reviewed the patient's chart and labs.  Questions were answered to the patient's satisfaction.     Adin Hector

## 2014-04-08 NOTE — Transfer of Care (Signed)
Immediate Anesthesia Transfer of Care Note  Patient: Natalie Arias  Procedure(s) Performed: Procedure(s): PARTIAL MASTECTOMY WITH NEEDLE LOCALIZATION AND AXILLARY SENTINEL LYMPH NODE BIOPSY (Right)  Patient Location: PACU  Anesthesia Type:GA combined with regional for post-op pain  Level of Consciousness: awake and patient cooperative  Airway & Oxygen Therapy: Patient Spontanous Breathing and Patient connected to face mask oxygen  Post-op Assessment: Report given to PACU RN and Post -op Vital signs reviewed and stable  Post vital signs: Reviewed and stable  Complications: No apparent anesthesia complications

## 2014-04-08 NOTE — Progress Notes (Signed)
Assisted Dr. Crews with right, ultrasound guided, pectoralis block. Side rails up, monitors on throughout procedure. See vital signs in flow sheet. Tolerated Procedure well. 

## 2014-04-08 NOTE — Op Note (Signed)
Patient Name:           Natalie Arias   Date of Surgery:        04/08/2014  Pre op Diagnosis:      Invasive mammary carcinoma right breast, upper inner quadrant, triple negative breast cancer. Initial stage cT2, N0 Status post recent neoadjuvant chemotherapy  Post op Diagnosis:    same  Procedure:                 Inject blue dye right breast Right partial mastectomy with bracketed needle localization Right axillary sentinel node biopsy  Surgeon:                     Edsel Petrin. Dalbert Batman, M.D., FACS  Assistant:                      None  Operative Indications:   Natalie Arias is a 53 y.o. female.  Initially, she was referred by Dr. Elige Radon at [facility] health care for evaluation and management of a locally advanced invasive cancer of the right breast at the 3:00 position. Dr. Briscoe Deutscher is her primary care physician in [facility]  She was initially evaluated in the [facility] by Dr. Humphrey Rolls, Dr. Lisbeth Renshaw, and me.  She noticed a lump in her right breast in August.. She states her last mammogram was in December 2013 and nothing was seen. Mammograms showed a 3.5 cm suspicious mass in the right breast medially. Image guided biopsy shows invasive mammary carcinoma, probably invasive ductal carcinoma. This is a TNBC.Marland Kitchen  MRI shows that this is a solitary mass. Nodes looked negative by MRI.  She is interested in breast conservation. I placed a Port-A-Cath on 10/18/2013. She has completed neoadjuvant chemotherapy. She states that Dr. Humphrey Rolls would like to leave the port in for now.  An MRI was performed on March 3. This was prior to the end of treatment. She has a residual enhancing mass in the inner right breast measuring 3.7 x 3.2 x 2.0 cm. This is compared to prior measurements of 3.9 x 3.7 x 3.1 cm.  Family history reveals breast cancer in a maternal grandmother and a paternal aunt but no ovarian cancer.  She is doing fairly well, considering. She says she thinks that the mass in her right breast is  responding and is much less palpable    Operative Findings:       To be certain that we had defined the medial and lateral aspects of the tumor, Dr. Ladonna Snide placed bracketed wire localization this morning. These seemed to be well placed. The tumor appeared to be at about the 1:30 to 2:00 position of the right breast. There was still a little but of palpable thickening. Specimen mammogram looked good and Dr. Marcelo Baldy felt that we had greater than 1 cm margin. Gross evaluation in the lab by Dr. Saralyn Pilar suggested that we had negative gross margins. We found 2 sentinel lymph nodes in the right axilla but no pathologically enlarged lymph nodes.  Procedure in Detail:          Following the placement of the bracketed wire at [facility] mammography, the patient was brought to [facility] center. Radionuclide was injected into the right breast by the nuclear medicine technician. She was taken to the operating room and underwent general anesthesia with an LMA device. Surgical time out was performed and intravenous antibiotics were given. Following alcohol prep I injected 5 cc of blue dye  into the right breast, subareolar area. This was 2 cc of isosulfan blue mixed with 3 cc of saline. The breast was massaged for a few minutes.      We then prepped and draped the entire right breast in the upper arm and axilla and chest wall . The patient had a pectoral block preoperatively by Dr. Al Corpus. He also asked that I inject of 0.5 Marcaine with epinephrine into the subcutaneous tissue which I did.     For the lumpectomy I chose to make a generous radially oriented ellipse at about the 1:30 position of the right breast. This went down to and slightly inside the areolar margin because of the tumor. Dissection was carried down deeply and into the breast tissue and widely around the wires. The specimen was removed (8 X 9 X 5 cm.)   and marked with silk sutures at 3 cardinal positions to oriented the pathologist and was also marked  with the 6  color ink kit for further orientation. The specimen mammogram looked like we had completely removed the tumor and pathologic exam by Dr. Saralyn Pilar suggested we had negative gross margins. Hemostasis was excellent and achieved electrocautery. Wound was irrigated with saline. We placed metal clips a 6 cardinal positions the lumpectomy cavity. The lumpectomy cavity was carefully closed in multiple layers with interrupted 3-0 Vicryl sutures and skin closed with a running subcuticular suture of 4-0 Monocryl and Dermabond.     We made an incision in the right axilla at the hairline. Using the neoprobe we dissected down into the clavipectoral fascia into the axilla and the count found two very hot, very blue lymph nodes and removed those. There was no other significant radioactivity. Hemostasis was excellent. The wound was irrigated. The deeper tissues were closed with 3-0 Vicryl sutures and the skin closed with a running subcuticular suture of 4-0 Monocryl and Dermabond. Dry cushioning bandages and a breast binder were placed the patient taken to recovery in stable condition. EBL 30 cc or less. Counts correct. Complications none.     Edsel Petrin. Dalbert Batman, M.D., FACS General and Minimally Invasive Surgery Breast and Colorectal Surgery  04/08/2014 12:51 PM

## 2014-04-08 NOTE — Discharge Instructions (Signed)
[facility] °Office Phone Number [phone] ° °BREAST BIOPSY/ PARTIAL MASTECTOMY: POST OP INSTRUCTIONS ° °Always review your discharge instruction sheet given to you by the facility where your surgery was performed. ° °IF YOU HAVE DISABILITY OR FAMILY LEAVE FORMS, YOU MUST BRING THEM TO THE OFFICE FOR PROCESSING.  DO NOT GIVE THEM TO YOUR DOCTOR. ° °1. A prescription for pain medication may be given to you upon discharge.  Take your pain medication as prescribed, if needed.  If narcotic pain medicine is not needed, then you may take acetaminophen (Tylenol) or ibuprofen (Advil) as needed. °2. Take your usually prescribed medications unless otherwise directed °3. If you need a refill on your pain medication, please contact your pharmacy.  They will contact our office to request authorization.  Prescriptions will not be filled after 5pm or on week-ends. °4. You should eat very light the first 24 hours after surgery, such as soup, crackers, pudding, etc.  Resume your normal diet the day after surgery. °5. Most patients will experience some swelling and bruising in the breast.  Ice packs and a good support bra will help.  Swelling and bruising can take several days to resolve.  °6. It is common to experience some constipation if taking pain medication after surgery.  Increasing fluid intake and taking a stool softener will usually help or prevent this problem from occurring.  A mild laxative (Milk of Magnesia or Miralax) should be taken according to package directions if there are no bowel movements after 48 hours. °7. Unless discharge instructions indicate otherwise, you may remove your bandages 24-48 hours after surgery, and you may shower at that time.  You may have steri-strips (small skin tapes) in place directly over the incision.  These strips should be left on the skin for 7-10 days.  If your surgeon used skin glue on the incision, you may shower in 24 hours.  The glue will flake off over the  next 2-3 weeks.  Any sutures or staples will be removed at the office during your follow-up visit. °8. ACTIVITIES:  You may resume regular daily activities (gradually increasing) beginning the next day.  Wearing a good support bra or sports bra minimizes pain and swelling.  You may have sexual intercourse when it is comfortable. °a. You may drive when you no longer are taking prescription pain medication, you can comfortably wear a seatbelt, and you can safely maneuver your car and apply brakes. °b. RETURN TO WORK:  ______________________________________________________________________________________ °9. You should see your doctor in the office for a follow-up appointment approximately two weeks after your surgery.  Your doctor’s nurse will typically make your follow-up appointment when she calls you with your pathology report.  Expect your pathology report 2-3 business days after your surgery.  You may call to check if you do not hear from us after three days. °10. OTHER INSTRUCTIONS: _______________________________________________________________________________________________ _____________________________________________________________________________________________________________________________________ °_____________________________________________________________________________________________________________________________________ °_____________________________________________________________________________________________________________________________________ ° °WHEN TO CALL YOUR DOCTOR: °1. Fever over 101.0 °2. Nausea and/or vomiting. °3. Extreme swelling or bruising. °4. Continued bleeding from incision. °5. Increased pain, redness, or drainage from the incision. ° °The clinic staff is available to answer your questions during regular business hours.  Please don’t hesitate to call and ask to speak to one of the nurses for clinical concerns.  If you have a medical emergency, go to the nearest  emergency room or call 911.  A surgeon from [facility] is always on call at the hospital. ° °For further questions, please visit [email or web address]  ° ° °  Post Anesthesia Home Care Instructions ° °Activity: °Get plenty of rest for the remainder of the day. A responsible adult should stay with you for 24 hours following the procedure.  °For the next 24 hours, DO NOT: °-Drive a car °-Operate machinery °-Drink alcoholic beverages °-Take any medication unless instructed by your physician °-Make any legal decisions or sign important papers. ° °Meals: °Start with liquid foods such as gelatin or soup. Progress to regular foods as tolerated. Avoid greasy, spicy, heavy foods. If nausea and/or vomiting occur, drink only clear liquids until the nausea and/or vomiting subsides. Call your physician if vomiting continues. ° °Special Instructions/Symptoms: °Your throat may feel dry or sore from the anesthesia or the breathing tube placed in your throat during surgery. If this causes discomfort, gargle with warm salt water. The discomfort should disappear within 24 hours. ° °

## 2014-04-08 NOTE — Anesthesia Postprocedure Evaluation (Signed)
  Anesthesia Post-op Note  Patient: Natalie Arias  Procedure(s) Performed: Procedure(s): PARTIAL MASTECTOMY WITH NEEDLE LOCALIZATION AND AXILLARY SENTINEL LYMPH NODE BIOPSY (Right)  Patient Location: PACU  Anesthesia Type:GA combined with regional for post-op pain  Level of Consciousness: awake, alert  and oriented  Airway and Oxygen Therapy: Patient Spontanous Breathing  Post-op Pain: none  Post-op Assessment: Post-op Vital signs reviewed  Post-op Vital Signs: Reviewed  Last Vitals:  Filed Vitals:   04/08/14 1330  BP: 141/86  Pulse: 95  Temp:   Resp: 13    Complications: No apparent anesthesia complications

## 2014-04-08 NOTE — Progress Notes (Signed)
Emotional support during breast injections °

## 2014-04-08 NOTE — Anesthesia Procedure Notes (Addendum)
Procedure Name: LMA Insertion Date/Time: 04/08/2014 11:30 AM Performed by: BLOCKER, TIMOTHY Pre-anesthesia Checklist: Patient identified, Emergency Drugs available, Suction available and Patient being monitored Patient Re-evaluated:Patient Re-evaluated prior to inductionOxygen Delivery Method: Circle System Utilized Preoxygenation: Pre-oxygenation with 100% oxygen Intubation Type: IV induction Ventilation: Mask ventilation without difficulty LMA: LMA inserted LMA Size: 4.0 Number of attempts: 1 Airway Equipment and Method: bite block Placement Confirmation: positive ETCO2 Tube secured with: Tape Dental Injury: Teeth and Oropharynx as per pre-operative assessment     Anesthesia Regional Block:  Pectoralis block  Pre-Anesthetic Checklist: ,, timeout performed, Correct Patient, Correct Site, Correct Laterality, Correct Procedure, Correct Position, site marked, Risks and benefits discussed,  Surgical consent,  Pre-op evaluation,  At surgeon's request and post-op pain management  Laterality: Right and Upper  Prep: chloraprep       Needles:  Injection technique: Single-shot  Needle Type: Echogenic Needle     Needle Length: 9cm 9 cm Needle Gauge: 21 and 21 G    Additional Needles:  Procedures: ultrasound guided (picture in chart) Pectoralis block Narrative:  Start time: 04/08/2014 11:12 AM End time: 04/08/2014 11:16 AM Injection made incrementally with aspirations every 5 mL.  Performed by: Personally  Anesthesiologist: Lorrene Reid, MD

## 2014-04-11 NOTE — Progress Notes (Signed)
Quick Note:  Inform patient of Pathology report,.Tell her that the cancer was 3.7 cm, completely excised with negative margins. Also tell her that the sentinel lymph nodes were negative for cancer. This is good news, and she will not need any further surgery. I will discuss this with her in detail at her first postop visit.  hmi ______

## 2014-04-12 ENCOUNTER — Encounter (HOSPITAL_BASED_OUTPATIENT_CLINIC_OR_DEPARTMENT_OTHER): Payer: Self-pay | Admitting: General Surgery

## 2014-04-12 ENCOUNTER — Telehealth (INDEPENDENT_AMBULATORY_CARE_PROVIDER_SITE_OTHER): Payer: Self-pay

## 2014-04-12 NOTE — Telephone Encounter (Signed)
Pt called in to get her pathology results. I advised pt that Dr Dalbert Batman attached a note to the report stating that the cancer has been all excised with negative margins along with the sent.node being negative. Dr Dalbert Batman will go over in full detail at the pt's next post op appt with him. The pt understands.

## 2014-04-22 ENCOUNTER — Ambulatory Visit (HOSPITAL_BASED_OUTPATIENT_CLINIC_OR_DEPARTMENT_OTHER): Payer: BC Managed Care – PPO | Admitting: Oncology

## 2014-04-22 ENCOUNTER — Telehealth: Payer: Self-pay | Admitting: Oncology

## 2014-04-22 ENCOUNTER — Other Ambulatory Visit (HOSPITAL_BASED_OUTPATIENT_CLINIC_OR_DEPARTMENT_OTHER): Payer: BC Managed Care – PPO

## 2014-04-22 VITALS — BP 117/79 | HR 125 | Temp 98.6°F | Resp 18 | Ht 65.0 in | Wt 210.7 lb

## 2014-04-22 DIAGNOSIS — C50219 Malignant neoplasm of upper-inner quadrant of unspecified female breast: Secondary | ICD-10-CM

## 2014-04-22 DIAGNOSIS — C50311 Malignant neoplasm of lower-inner quadrant of right female breast: Secondary | ICD-10-CM

## 2014-04-22 DIAGNOSIS — Z171 Estrogen receptor negative status [ER-]: Secondary | ICD-10-CM

## 2014-04-22 DIAGNOSIS — C50919 Malignant neoplasm of unspecified site of unspecified female breast: Secondary | ICD-10-CM

## 2014-04-22 DIAGNOSIS — G609 Hereditary and idiopathic neuropathy, unspecified: Secondary | ICD-10-CM

## 2014-04-22 LAB — CBC WITH DIFFERENTIAL/PLATELET
BASO%: 0.4 % (ref 0.0–2.0)
BASOS ABS: 0 10*3/uL (ref 0.0–0.1)
EOS ABS: 0.3 10*3/uL (ref 0.0–0.5)
EOS%: 6.7 % (ref 0.0–7.0)
HEMATOCRIT: 34.3 % — AB (ref 34.8–46.6)
HEMOGLOBIN: 11.3 g/dL — AB (ref 11.6–15.9)
LYMPH%: 35.6 % (ref 14.0–49.7)
MCH: 31 pg (ref 25.1–34.0)
MCHC: 32.9 g/dL (ref 31.5–36.0)
MCV: 94 fL (ref 79.5–101.0)
MONO#: 0.4 10*3/uL (ref 0.1–0.9)
MONO%: 8.5 % (ref 0.0–14.0)
NEUT%: 48.8 % (ref 38.4–76.8)
NEUTROS ABS: 2.5 10*3/uL (ref 1.5–6.5)
Platelets: 236 10*3/uL (ref 145–400)
RBC: 3.65 10*6/uL — ABNORMAL LOW (ref 3.70–5.45)
RDW: 16.8 % — ABNORMAL HIGH (ref 11.2–14.5)
WBC: 5.1 10*3/uL (ref 3.9–10.3)
lymph#: 1.8 10*3/uL (ref 0.9–3.3)
nRBC: 0 % (ref 0–0)

## 2014-04-22 LAB — COMPREHENSIVE METABOLIC PANEL (CC13)
ALK PHOS: 84 U/L (ref 40–150)
ALT: 28 U/L (ref 0–55)
AST: 26 U/L (ref 5–34)
Albumin: 4.2 g/dL (ref 3.5–5.0)
Anion Gap: 11 mEq/L (ref 3–11)
BUN: 8.2 mg/dL (ref 7.0–26.0)
CO2: 25 meq/L (ref 22–29)
Calcium: 10.1 mg/dL (ref 8.4–10.4)
Chloride: 107 mEq/L (ref 98–109)
Creatinine: 0.8 mg/dL (ref 0.6–1.1)
Glucose: 146 mg/dl — ABNORMAL HIGH (ref 70–140)
POTASSIUM: 3.8 meq/L (ref 3.5–5.1)
Sodium: 143 mEq/L (ref 136–145)
Total Bilirubin: 0.55 mg/dL (ref 0.20–1.20)
Total Protein: 6.8 g/dL (ref 6.4–8.3)

## 2014-04-22 MED ORDER — PREGABALIN 100 MG PO CAPS: 100.0000 mg | ORAL_CAPSULE | Freq: Two times a day (BID) | ORAL | Status: DC

## 2014-04-22 NOTE — Telephone Encounter (Signed)
gv pt appt schedule for june and appt w/Dr. moody for 4/29. pt will take care of making appt for ABC class.

## 2014-04-22 NOTE — Progress Notes (Signed)
[facility]  [phone] [phone]  OFFICE PROGRESS NOTE   ID: Natalie Arias   DOB: 1961-02-09  MR#: [id number]  EML#:[id number]   PCP: [name], PA-C SU: Fanny Skates, MD RAD ONC:  Kyung Rudd, MD    CHIEF COMPLAINT: "I finally had the surgery"  STAGE:  Breast cancer of lower-inner quadrant of right female breast   Primary site: Breast (Right)   Staging method: AJCC 7th Edition   Clinical: Stage IIA (T2, N0, cM0)   Pathologic stage IIA (pT2 pN0)     Breast cancer history: From doctor also [facility] intake note 10/06/2013:  "Patient felt a mass in the right breast. Her last normal mammogram was in December 2000 third team. In September 2014 patient felt a mass in the right breast. She is to her primary care physician's attention. She had a mammogram performed that did reveal a suspicious mass at the 4:00 position was irregular and spiculated. On ultrasound measured 3.5 cm irregular with an indistinct margins in the right breast at 4:00 anterior depth. No other significant masses calcifications or other findings were noted in either breast her MRI is pending. Patient had a biopsy performed on 09/30/2013. 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 The tumor was estrogen receptor negative progesterone receptor negative negative with a Ki-67 80%."   CURRENT THERAPY: Completed adjuvant chemotherapy 03/18/2014  INTERVAL HISTORY: Natalie Arias  returns today accompanied by her husband Abbe Amsterdam for followup of her breast cancer. Since her last visit here she completed her adjuvant treatment and proceeded to right partial mastectomy with sentinel lymph node sampling 04/08/2014. The final pathology ([id number]) showed a 3.5 cm high-grade invasive ductal carcinoma, with both sentinel lymph nodes clear. Repeat prognostic panel was again negative for estrogen and progesterone and negative for HER-2  amplification with the signals ratio of 1.08 and a copy number per cell 2.05. Margins were ample.  REVIEW OF SYSTEMS: Natalie Arias did well with the surgery, without unusual pain, fever, or bleeding. She is still taking oxycodone occasionally, which minimally constipates her. She is under good bowel prophylaxis regimen. Otherwise she continues to be bothered by peripheral neuropathy. This affects chiefly her feet but also her hands. She is not aware of dropping things. Is not limited in her dexterity that she can tell. Her feet however are known and tingle. She tripped one time but has not otherwise fallen. She was on 200 mg of gabapentin 3 times daily and this caused significant lower extremity swelling so it had to be stopped. Otherwise a detailed review of systems today was noncontributory   PAST MEDICAL HISTORY: Past Medical History  Diagnosis Date  . Asthma 20's    allergic asthema  . Seasonal allergies   . PONV (postoperative nausea and vomiting)   . Breast cancer dx'd 10/01/2013    right; triplen negative    PAST SURGICAL HISTORY: Past Surgical History  Procedure Laterality Date  . Laparoscopic endometriosis fulguration  1991  . Abdominal hysterectomy  1992  . Portacath placement N/A 10/18/2013    Procedure: INSERTION PORT-A-CATH WITH ULTRASOUND;  Surgeon: Adin Hector, MD;  Location: [facility];  Service: General;  Laterality: N/A;  . Partial mastectomy with needle localization and axillary sentinel lymph node bx Right 04/08/2014    Procedure: PARTIAL MASTECTOMY WITH NEEDLE LOCALIZATION AND AXILLARY SENTINEL LYMPH NODE BIOPSY;  Surgeon: Adin Hector, MD;  Location: [facility];  Service: General;  Laterality: Right;  FAMILY HISTORY: Family History  Problem Relation Age of Onset  . Lung cancer Maternal Uncle     smoker  . Breast cancer Paternal Aunt     dx in her late 73s; maternal half sister to patient's father  . Breast cancer Maternal Grandmother 47  . Lung  cancer Maternal Uncle     smoker  . Hypertension Father   . Heart disease Father   . Parkinsonism Father   . Skin cancer Father   . Hypercholesterolemia Mother   . Gallstones Mother   . Healthy Brother   . COPD Maternal Grandfather   . Lung cancer Cousin     smoker; paternal cousin    SOCIAL HISTORY: History  Substance Use Topics  . Smoking status: Never Smoker   . Smokeless tobacco: Never Used  . 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 Her husband Arnette Norris is retired from all tell. Son Leroy Sea lives in [place] where he manages a long Risk manager. Daughter Alyse Low is also in [place], working in Pharmacologist. The patient herself lived mostly in [place]. She has no brain children. She is a Psychologist, forensic.  Gynecologic history: Menarche age 44, first live birth age 89, the patient is [place] P2. She underwent hysterectomy and unilateral salpingo-oophorectomy in 1990. She took estrogen replacement until 2014.   ALLERGIES: Allergies  Allergen Reactions  . Penicillins Hives    CURRENT MEDICATIONS: Current Outpatient Prescriptions  Medication Sig Dispense Refill  . B Complex-C (SUPER B COMPLEX PO) Take 1 tablet by mouth.      . fexofenadine (ALLEGRA) 180 MG tablet Take 180 mg by mouth daily.      Marland Kitchen gabapentin (NEURONTIN) 100 MG capsule Take 200 mg by mouth 3 (three) times daily.      . hydrocortisone (ANUSOL-HC) 2.5 % rectal cream Place 1 application rectally 2 (two) times daily.  30 g  0  . ondansetron (ZOFRAN) 8 MG tablet Take 1 tablet (8 mg total) by mouth 2 (two) times daily. Take two times a day starting the day after chemo for 3 days. Then take two times a day as needed for nausea or vomiting.  30 tablet  1  . oxyCODONE-acetaminophen (PERCOCET) 7.5-325 MG per tablet Take 1 tablet by mouth every 4 (four) hours as needed for pain.  30 tablet  0  . polyethylene glycol (MIRALAX /  GLYCOLAX) packet Take 17 g by mouth daily as needed.      . pregabalin (LYRICA) 100 MG capsule Take 1 capsule (100 mg total) by mouth 2 (two) times daily.  60 capsule  3  . SENNA PO Take by mouth.      Marland Kitchen UNABLE TO FIND 1 each by Other route as needed. Cranial Prosthesis       No current facility-administered medications for this visit.    PHYSICAL EXAMINATION: Middle-aged white woman who appears younger than stated age Blood pressure 117/79, pulse 125, temperature 98.6 F (37 C), temperature source Oral, resp. rate 18, height _0  (1.651 m), weight 210 lb 11.2 oz (95.573 kg).  ECOG FS: 1 - Symptomatic but completely ambulatory  Sclerae unicteric, pupils equal and reactive Oropharynx clear and moist; teeth in good repair No cervical or supraclavicular adenopathy Lungs no rales or rhonchi Heart regular rate and rhythm Abd soft, obese, nontender, positive bowel sounds MSK no focal spinal tenderness, no upper extremity  lymphedema Neuro: nonfocal, well oriented, appropriate affect Breasts: The right breast is status post recent lumpectomy the incisions are healing nicely, with no dehiscence, erythema, or unusual tenderness. The right axilla is benign. The left breast is unremarkable.   LABORATORIES:   Chemistry      Component Value Date/Time   NA 143 04/22/2014 1308   NA 140 10/14/2013 0930   K 3.8 04/22/2014 1308   K 4.7 10/14/2013 0930   CL 105 10/14/2013 0930   CO2 25 04/22/2014 1308   CO2 29 10/14/2013 0930   BUN 8.2 04/22/2014 1308   BUN 12 10/14/2013 0930   CREATININE 0.8 04/22/2014 1308   CREATININE 0.71 10/14/2013 0930      Component Value Date/Time   CALCIUM 10.1 04/22/2014 1308   CALCIUM 9.6 10/14/2013 0930   ALKPHOS 84 04/22/2014 1308   ALKPHOS 70 10/14/2013 0930   AST 26 04/22/2014 1308   AST 16 10/14/2013 0930   ALT 28 04/22/2014 1308   ALT 18 10/14/2013 0930   BILITOT 0.55 04/22/2014 1308   BILITOT 0.3 10/14/2013 0930      Lab Results  Component Value Date    WBC 5.1 04/22/2014   HGB 11.3* 04/22/2014   HCT 34.3* 04/22/2014   MCV 94.0 04/22/2014   PLT 236 04/22/2014    STUDIES/IMAGING: Nm Sentinel Node Inj-no Rpt (breast)  04/08/2014   CLINICAL DATA: Cancer right breast   Sulfur colloid was injected intradermally by the nuclear medicine  technologist for breast cancer sentinel node localization.     ASSESSMENT: Natalie Arias is a 53 y.o. BRCA negative [facility] woman status post right breast biopsy August 2014 for a clinical T2 N0, stage IIA invasive ductal carcinoma, grade 2, triple negative, with an MIB-1 of 80%.  (1)  s/p neoadjuvant chemotherapy with dose dense doxorubicin/ cyclophosphamide x4 with [name] day 2, completed 12/08/2013, followed by paclitaxel and carboplatin weekly x 12 completed 03/18/2014  (2) status post right lumpectomy and sentinel lymph node sampling for 10/18/2014 for a pT2 pN0, stage IIA invasive ductal carcinoma, grade 3, repeat prognostic panel again nipple negative.   (3) radiation therapy to follow lumpectomy   PLAN:  Favour established herself in my service today and we spent approximately 1 hour going over her situation. She received optimal chemotherapy treatment for triple negative disease and is now ready to proceed to the second part of her local treatment, namely radiation therapy.   It is of concern that her breast mass did not significantly shrink despite the chemotherapy. Nevertheless it is favorable that her lymph nodes were negative. Based on the Adjuvant! Program, her mortality risk from this breast cancer over the next 10 years is 28%, so to look at it positively she has a better than 70% chance of not having distant disease recurrence 10 years from now.   Her peripheral neuropathy symptoms unfortunately may be permanent, and we do not have good therapy for this. She is already on vitamin D supplementation. She did not tolerate gabapentin. We're going to try Lyrica  low-dose and if she tolerates it well we  will try to increase the dose to best response.  She is going to see [place] again in 2 months, by which time she should be completing her radiation treatments. At that time we will initiate long-term followup, which will be visits every 3 months alternating with her surgeon for the first 2 years, and then at increased intervals. In the interim we are referring her for the  ABC program.  Georjean has a good understanding of the overall plan. She agrees with that. She knows a goal of treatment in her cases cure. She will call with any problems that may develop before her next visit here.

## 2014-04-24 ENCOUNTER — Telehealth: Payer: Self-pay | Admitting: *Deleted

## 2014-04-24 NOTE — Telephone Encounter (Signed)
Rx printed on plain paper. Called in to pharmacy

## 2014-04-26 ENCOUNTER — Encounter (INDEPENDENT_AMBULATORY_CARE_PROVIDER_SITE_OTHER): Payer: Self-pay | Admitting: General Surgery

## 2014-04-26 ENCOUNTER — Encounter (INDEPENDENT_AMBULATORY_CARE_PROVIDER_SITE_OTHER): Payer: Self-pay

## 2014-04-26 ENCOUNTER — Ambulatory Visit (INDEPENDENT_AMBULATORY_CARE_PROVIDER_SITE_OTHER): Payer: BC Managed Care – PPO | Admitting: General Surgery

## 2014-04-26 ENCOUNTER — Encounter: Payer: Self-pay | Admitting: Radiation Oncology

## 2014-04-26 VITALS — BP 118/80 | HR 64 | Temp 98.6°F | Resp 15 | Ht 66.0 in | Wt 211.8 lb

## 2014-04-26 DIAGNOSIS — C50311 Malignant neoplasm of lower-inner quadrant of right female breast: Secondary | ICD-10-CM

## 2014-04-26 DIAGNOSIS — C50319 Malignant neoplasm of lower-inner quadrant of unspecified female breast: Secondary | ICD-10-CM

## 2014-04-26 NOTE — Progress Notes (Signed)
Patient ID: Natalie Arias, female   DOB: [birth year], 53 y.o.   MRN: [id number] History: This patient returns for her first postop appointment. She was diagnosed with locally advanced cancer in the right breast, upper inner quadrant with a 3.5 cm mass which showed to be a TNBC.    This was solitary by MRI.   Port-A-Cath was placed and she underwent neoadjuvant chemotherapy. On 04/08/2014 she underwent right partial mastectomy with bracketed needle localization and right axillary sentinel node biopsy. I left of the Port-A-Cath in intentionally.   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 She is doing well and feels well. She is going to see Dr. Kyung Rudd tomorrow.  Exam: Patient is here with her husband. She is in good spirits and in no distress. Both breasts are examined. Radially oriented incision right breast, upper inner quadrant looks good. Right axillary incision was good. Breast contour and nipple projection is good. Modest volume loss.  Assessment: Invasive mammary carcinoma right breast, upper inner quadrant, TNBC, stage ypT2, ypN0 Status post neoadjuvant chemotherapy following Port-A-Cath placement, partial response Recovering uneventfully following right partial mastectomy and sentinel node biopsy  Plan: Diet and activities and wound care discussed. Okay to proceed with radiation therapy in early to mid May Return to see me in 4 months, sooner if there are any problems When it is time to remove the Port-A-Cath, she wants this to be done under sedation    Edsel Petrin. Dalbert Batman, M.D., [facility] General and Minimally invasive Surgery Breast and Colorectal Surgery Office:   [phone] Pager:   [phone]

## 2014-04-26 NOTE — Patient Instructions (Signed)
You are recovering from your right partial mastectomy and right axillary sentinal node biopsy without any obvious surgical problems.  You may wash the Super Glue off in the shower.  You may exercise daily  Keep your appointment with Dr. Lisbeth Renshaw tomorrow. I suspect that he will probably begin planning for radiation therapy to start some time in early to mid May.  Keep your appointment with your medical oncologist in June  Return to see Dr. Dalbert Batman in 4 months, sooner if there are any problems.

## 2014-04-26 NOTE — Progress Notes (Signed)
Location of Breast Cancer Lower Inner quadrant right breast  Histology per Pathology Report: 09/30/13 biopsy, revealed invasive mammary carcinoma, no path report in  Epic  Receptor Status: ER(   Neg ), PR (  Neg ), Her2-neu (   Neg )  Did patient present with symptoms (if so, please note symptoms) or was this found on screening mammography?: patient felt a mass in right breast  September 2014,Bx done 09/30/13  Past/Anticipated interventions by surgeon, if any: Diagnosis 04/08/14 1. Breast, partial mastectomy, Right- INVASIVE DUCTAL CARCINOMA, 3.7 CM.- HIGH GRADE DUCTAL CARCINOMA IN SITU WITH NECROSIS.- MARGINS NOT INVOLVED.2. Lymph node, sentinel, biopsy, Right axillary #1 - ONE BENIGN LYMPH NODES (0/1).3. Lymph node, sentinel, biopsy, Right axillary #2- ONE BENIGN LYMPH NODE (0/1).4. Fatty tissue, Right axillary- BENIGN ADIPOSE TISSUE. NO LYMPHOID TISSUE OR MALIGNANCY. Dr. Edsel Petrin. Dalbert Batman follow up post o check 04/26/14, follow back again in 4 months  Past/Anticipated interventions by medical oncology, if any: Chemotherapy : CURRENT THERAPY: Completed adjuvant chemotherapy 03/18/2014 Adriamycin/Cytoxan x 4 cycles w/ neulasta support followed by Taxol/Carbo weekly x 12 weeks, Genetic testing 01/06/14 neg.  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 No  Pacemaker/ICD? No  Possible current pregnancy? No  Is the patient on methotrexate? No   Current Complaints / other details:  Married, 2 children  Menarche age 70, surgical menopause 1992, HRT 7 years, stopped  09/2013, first live birth age 69,   2  Maternal Uncles lung  cancer(smoker), Paternal aunt breast ca, paternal  cousin lung ca(smoker) Maternal grandmother Breast Ca,     Rebecca Eaton, RN 04/26/2014,8:55 AM

## 2014-04-27 ENCOUNTER — Ambulatory Visit
Admission: RE | Admit: 2014-04-27 | Discharge: 2014-04-27 | Disposition: A | Discharge status: Home / Self Care | Payer: BC Managed Care – PPO | Source: Ambulatory Visit | Attending: Radiation Oncology | Admitting: Radiation Oncology

## 2014-04-27 ENCOUNTER — Encounter: Payer: Self-pay | Admitting: Radiation Oncology

## 2014-04-27 VITALS — BP 107/81 | HR 107 | Temp 99.1°F | Resp 20 | Ht 66.0 in | Wt 213.4 lb

## 2014-04-27 VITALS — Ht 66.0 in | Wt 213.4 lb

## 2014-04-27 DIAGNOSIS — C50311 Malignant neoplasm of lower-inner quadrant of right female breast: Secondary | ICD-10-CM

## 2014-04-27 DIAGNOSIS — Z510 Encounter for antineoplastic radiation therapy: Secondary | ICD-10-CM | POA: Insufficient documentation

## 2014-04-27 DIAGNOSIS — C50319 Malignant neoplasm of lower-inner quadrant of unspecified female breast: Secondary | ICD-10-CM | POA: Insufficient documentation

## 2014-04-27 HISTORY — DX: Allergy, unspecified, initial encounter: T78.40XA

## 2014-04-27 NOTE — Progress Notes (Signed)
Radiation Oncology         [phone] ________________________________  Name: Natalie Arias MRN: [id number]  Date: 04/27/2014  DOB: 09-01-1961  Follow-Up Visit Note  CC: HEPLER,MARK, PA-C  Magrinat, Virgie Dad, MD  Diagnosis:   Invasive ductal carcinoma of the right breast  Narrative:  The patient returns today for routine follow-up.  The patient was originally seen in multidisciplinary clinic. She was felt to be a good candidate for breast conservation treatment. She has completed surgery consisting of a lumpectomy. Final pathology revealed a pT2N 0 tumor with negative margins. The patient has also completed chemotherapy preoperatively. The patient has done satisfactorily postoperatively. She is appropriate to proceed with adjuvant radiation treatment at this time.  ALLERGIES:  is allergic to penicillins.  Meds: Current Outpatient Prescriptions  Medication Sig Dispense Refill  . B Complex-C (SUPER B COMPLEX PO) Take 1 tablet by mouth.      . cholecalciferol (VITAMIN D) 400 UNITS TABS tablet Take 400 Units by mouth daily. 2 daily      . fexofenadine (ALLEGRA) 180 MG tablet Take 180 mg by mouth as needed.       . polyethylene glycol (MIRALAX / GLYCOLAX) packet Take 17 g by mouth daily as needed.      . pregabalin (LYRICA) 100 MG capsule Take 1 capsule (100 mg total) by mouth 2 (two) times daily.  60 capsule  3  . SENNA PO Take 2 tablets by mouth daily.       Marland Kitchen UNABLE TO FIND 1 each by Other route as needed. Cranial Prosthesis      . hydrocortisone (ANUSOL-HC) 2.5 % rectal cream Place 1 application rectally 2 (two) times daily.  30 g  0   No current facility-administered medications for this encounter.    Physical Findings: The patient is in no acute distress. Patient is alert and oriented.  height is 5\' 6"  (1.676 m) and weight is 213 lb 6.4 oz (96.798 kg). Her oral temperature is 99.1 F (37.3 C). Her blood pressure is 107/81 and her pulse is 107. Her respiration is 20. .   The  patient is s/p lumpectomy. The surgical incision is healing well.  Lab Findings: Lab Results  Component Value Date   WBC 5.1 04/22/2014   HGB 11.3* 04/22/2014   HCT 34.3* 04/22/2014   MCV 94.0 04/22/2014   PLT 236 04/22/2014     Radiographic Findings: Nm Sentinel Node Inj-no Rpt (breast)  04/08/2014   CLINICAL DATA: Cancer right breast   Sulfur colloid was injected intradermally by the nuclear medicine  technologist for breast cancer sentinel node localization.     Impression:    The patient is status post lumpectomy and chemotherapy as part of her breast conservation treatment strategy. The patient is appropriate to proceed with adjuvant radiation treatment at this time.  I discussed with the patient the role of radiation treatment in this setting. We discussed the expected benefit in terms of local/regional control area we also discussed the possible side effects and risks of treatment. All of her questions were answered.  We also discussed the logistics of treatment. The patient wishes to proceed with simulation.  Plan:  The patient will be scheduled for a simulation in the near future such that we can begin treatment planning. I anticipate treating the patient with a 6-1/2 week course of radiation treatment. This will correspond to whole breast radiation treatment to the right breast using tangent fields.   Jodelle Gross, M.D., Ph.D.

## 2014-04-27 NOTE — Addendum Note (Signed)
Encounter addended by: Nik Gorrell Mintz Axton Cihlar, RN on: 04/27/2014  7:19 PM<BR>     Documentation filed: Charges VN

## 2014-04-27 NOTE — Progress Notes (Signed)
Please see the Nurse Progress Note in the MD Initial Consult Encounter for this patient. 

## 2014-04-29 ENCOUNTER — Ambulatory Visit
Admission: RE | Admit: 2014-04-29 | Discharge: 2014-04-29 | Disposition: A | Discharge status: Home / Self Care | Payer: BC Managed Care – PPO | Source: Ambulatory Visit | Attending: Radiation Oncology | Admitting: Radiation Oncology

## 2014-04-29 DIAGNOSIS — C50311 Malignant neoplasm of lower-inner quadrant of right female breast: Secondary | ICD-10-CM

## 2014-04-29 NOTE — Progress Notes (Addendum)
  Radiation Oncology         [phone] ________________________________  Name: ASLYNN BRUNETTI MRN: [id number]  Date: 04/29/2014  DOB: [birth year]  SIMULATION AND TREATMENT PLANNING NOTE  The patient presented for simulation prior to beginning her course of radiation treatment for her diagnosis of right-sided breast cancer. The patient was placed in a supine position on a breast board. A customized accuform device was also constructed and this complex treatment device will be used on a daily basis during her treatment. In this fashion, a CT scan was obtained through the chest area and an isocenter was placed near the chest wall within the right breast.  The patient will be planned to receive a course of radiation initially to a dose of 50.4 gray. This will consist of a whole breast radiotherapy technique. To accomplish this, 2 customized blocks have been designed which will correspond to medial and lateral whole breast tangent fields. This treatment will be accomplished at 1.8 gray per fraction. This treatment will consist of a 3-D conformal technique. Dose volume histograms of the target, heart, and lungs have been requested.. A forward planning technique will also be evaluated to determine if this approach improves the plan. It is anticipated that the patient will then receive a 10 gray boost to the seroma cavity which has been contoured. This will be accomplished at 2 gray per fraction. The final anticipated total dose therefore will correspond to 60.4 gray.   _______________________________   Jodelle Gross, MD, PhD

## 2014-04-29 NOTE — Progress Notes (Signed)
  Radiation Oncology         [phone] ________________________________  Name: Natalie Arias MRN: [id number]  Date: 04/29/2014  DOB: 03-01-1961  Optical Surface Tracking Plan:  Since intensity modulated radiotherapy (IMRT) and 3D conformal radiation treatment methods are predicated on accurate and precise positioning for treatment, intrafraction motion monitoring is medically necessary to ensure accurate and safe treatment delivery.  The ability to quantify intrafraction motion without excessive ionizing radiation dose can only be performed with optical surface tracking. Accordingly, surface imaging offers the opportunity to obtain 3D measurements of patient position throughout IMRT and 3D treatments without excessive radiation exposure.  I am ordering optical surface tracking for this patient's upcoming course of radiotherapy. ________________________________  Marye Round, MD 04/29/2014 5:12 PM    Reference:   Particia Jasper, et al. Surface imaging-based analysis of intrafraction motion for breast radiotherapy patients.Journal of [place], n. 6, nov. 2014. ISSN [id number].   Available at: <http://www.jacmp.org/index.php/jacmp/article/view/4957>.

## 2014-05-06 ENCOUNTER — Ambulatory Visit
Admission: RE | Admit: 2014-05-06 | Discharge: 2014-05-06 | Disposition: A | Discharge status: Home / Self Care | Payer: BC Managed Care – PPO | Source: Ambulatory Visit | Attending: Radiation Oncology | Admitting: Radiation Oncology

## 2014-05-06 DIAGNOSIS — C50311 Malignant neoplasm of lower-inner quadrant of right female breast: Secondary | ICD-10-CM

## 2014-05-06 MED ORDER — ALRA NON-METALLIC DEODORANT (RAD-ONC)
1.0000 "application " | Freq: Once | TOPICAL | Status: AC
  Administered 2014-05-06: 1 via TOPICAL

## 2014-05-06 MED ORDER — RADIAPLEXRX EX GEL
Freq: Once | CUTANEOUS | Status: AC
  Administered 2014-05-06: 14:00:00 via TOPICAL

## 2014-05-06 NOTE — Progress Notes (Signed)
Pt education done, rad book, alra, radiaplex gel, skin products flyer,my business card and schedule given to patient, discussed fatigue,skin irritation,tenderness, pain, increase protein in diet, stay hydrated,sees MD weekly/Prn teach back given, all questions answered 2:01 PM

## 2014-05-06 NOTE — Progress Notes (Signed)
  Radiation Oncology         [phone] ________________________________  Name: Natalie Arias MRN: [id number]  Date: 05/06/2014  DOB: [birth year]  Simulation Verification Note   NARRATIVE: The patient was brought to the treatment unit and placed in the planned treatment position. The clinical setup was verified. Then port films were obtained and uploaded to the radiation oncology medical record software.  The treatment beams were carefully compared against the planned radiation fields. The position, location, and shape of the radiation fields was reviewed. The targeted volume of tissue appears to be appropriately covered by the radiation beams. Based on my personal review, I approved the simulation verification. The patient's treatment will proceed as planned.  ________________________________   Jodelle Gross, MD, PhD

## 2014-05-09 ENCOUNTER — Ambulatory Visit
Admission: RE | Admit: 2014-05-09 | Discharge: 2014-05-09 | Disposition: A | Discharge status: Home / Self Care | Payer: BC Managed Care – PPO | Source: Ambulatory Visit | Attending: Radiation Oncology | Admitting: Radiation Oncology

## 2014-05-10 ENCOUNTER — Ambulatory Visit
Admission: RE | Admit: 2014-05-10 | Discharge: 2014-05-10 | Disposition: A | Discharge status: Home / Self Care | Payer: BC Managed Care – PPO | Source: Ambulatory Visit | Attending: Radiation Oncology | Admitting: Radiation Oncology

## 2014-05-11 ENCOUNTER — Other Ambulatory Visit: Payer: Self-pay | Admitting: *Deleted

## 2014-05-11 ENCOUNTER — Ambulatory Visit
Admission: RE | Admit: 2014-05-11 | Discharge: 2014-05-11 | Disposition: A | Discharge status: Home / Self Care | Payer: BC Managed Care – PPO | Source: Ambulatory Visit | Attending: Radiation Oncology | Admitting: Radiation Oncology

## 2014-05-11 ENCOUNTER — Encounter: Payer: Self-pay | Admitting: Adult Health

## 2014-05-12 ENCOUNTER — Emergency Department (HOSPITAL_COMMUNITY): Payer: BC Managed Care – PPO

## 2014-05-12 ENCOUNTER — Ambulatory Visit
Admission: RE | Admit: 2014-05-12 | Discharge: 2014-05-12 | Disposition: A | Discharge status: Home / Self Care | Payer: BC Managed Care – PPO | Source: Ambulatory Visit | Attending: Radiation Oncology | Admitting: Radiation Oncology

## 2014-05-12 ENCOUNTER — Encounter (HOSPITAL_COMMUNITY): Payer: Self-pay | Admitting: Emergency Medicine

## 2014-05-12 ENCOUNTER — Emergency Department (HOSPITAL_COMMUNITY)
Admission: EM | Admit: 2014-05-12 | Discharge: 2014-05-12 | Disposition: A | Discharge status: Home / Self Care | Payer: BC Managed Care – PPO | Attending: Emergency Medicine | Admitting: Emergency Medicine

## 2014-05-12 ENCOUNTER — Telehealth: Payer: Self-pay | Admitting: Oncology

## 2014-05-12 DIAGNOSIS — Y929 Unspecified place or not applicable: Secondary | ICD-10-CM | POA: Insufficient documentation

## 2014-05-12 DIAGNOSIS — Z79899 Other long term (current) drug therapy: Secondary | ICD-10-CM | POA: Insufficient documentation

## 2014-05-12 DIAGNOSIS — S8263XA Displaced fracture of lateral malleolus of unspecified fibula, initial encounter for closed fracture: Secondary | ICD-10-CM | POA: Insufficient documentation

## 2014-05-12 DIAGNOSIS — S8262XA Displaced fracture of lateral malleolus of left fibula, initial encounter for closed fracture: Secondary | ICD-10-CM

## 2014-05-12 DIAGNOSIS — Y9389 Activity, other specified: Secondary | ICD-10-CM | POA: Insufficient documentation

## 2014-05-12 DIAGNOSIS — G608 Other hereditary and idiopathic neuropathies: Secondary | ICD-10-CM | POA: Insufficient documentation

## 2014-05-12 DIAGNOSIS — W108XXA Fall (on) (from) other stairs and steps, initial encounter: Secondary | ICD-10-CM | POA: Insufficient documentation

## 2014-05-12 DIAGNOSIS — C50919 Malignant neoplasm of unspecified site of unspecified female breast: Secondary | ICD-10-CM | POA: Insufficient documentation

## 2014-05-12 DIAGNOSIS — W010XXA Fall on same level from slipping, tripping and stumbling without subsequent striking against object, initial encounter: Secondary | ICD-10-CM | POA: Insufficient documentation

## 2014-05-12 DIAGNOSIS — R269 Unspecified abnormalities of gait and mobility: Secondary | ICD-10-CM | POA: Insufficient documentation

## 2014-05-12 DIAGNOSIS — Z880 Allergy status to penicillin: Secondary | ICD-10-CM | POA: Insufficient documentation

## 2014-05-12 DIAGNOSIS — J45909 Unspecified asthma, uncomplicated: Secondary | ICD-10-CM | POA: Insufficient documentation

## 2014-05-12 MED ORDER — OXYCODONE-ACETAMINOPHEN 5-325 MG PO TABS: 1.0000 | ORAL_TABLET | Freq: Four times a day (QID) | ORAL | Status: DC | PRN

## 2014-05-12 MED ORDER — ONDANSETRON 8 MG PO TBDP
8.0000 mg | ORAL_TABLET | Freq: Once | ORAL | Status: AC
  Administered 2014-05-12: 8 mg via ORAL
  Filled 2014-05-12: qty 1

## 2014-05-12 MED ORDER — ONDANSETRON HCL 4 MG PO TABS: 4.0000 mg | ORAL_TABLET | Freq: Four times a day (QID) | ORAL | Status: DC

## 2014-05-12 MED ORDER — OXYCODONE-ACETAMINOPHEN 5-325 MG PO TABS
2.0000 | ORAL_TABLET | Freq: Once | ORAL | Status: AC
  Administered 2014-05-12: 2 via ORAL
  Filled 2014-05-12: qty 2

## 2014-05-12 NOTE — ED Notes (Signed)
Pt c/o L ankle pain after slipping and falling down steps.  Pain score 6/10.  Swelling noted to L ankle.

## 2014-05-12 NOTE — Telephone Encounter (Signed)
s.w. pt and advised on 5.22.15 appt...pt ok and aware

## 2014-05-12 NOTE — ED Provider Notes (Signed)
CSN: [id number]     Arrival date & time 05/12/14  [phone] History   First MD Initiated Contact with Patient 05/12/14 0755     Chief Complaint  Patient presents with  . Fall  . Ankle Pain     (Consider location/radiation/quality/duration/timing/severity/associated sxs/prior Treatment) HPI Comments: Patient is a 53 year old female with history of seasonal allergies, breast cancer, and asthma who presents today with left ankle pain. Just prior to arrival she fell down the stairs. She states that it was a mechanical fall and she slipped. She thinks it is because she was wearing with shoes. She states she both inverted and everted her foot. There were no other injuries. She did not hit her or lose consciousness. She has been unable to ambulate since the fall. She describes the pain as sharp and shooting, worse with ambulation. She took an Advil prior to arrival which improved her pain. No fever, chills, nausea, vomiting, abdominal pain, shortness of breath, chest pain. She does have some tingling and numbness at her baseline from peripheral neuropathy.  She is currently on radiation for her breast cancer. She radiates every day M-F.   Patient is a 53 y.o. female presenting with fall and ankle pain. The history is provided by the patient. No language interpreter was used.  Fall Associated symptoms include arthralgias and myalgias. Pertinent negatives include no abdominal pain, chest pain, chills, fever, nausea or vomiting.  Ankle Pain Associated symptoms: no fever     Past Medical History  Diagnosis Date  . Seasonal allergies   . PONV (postoperative nausea and vomiting)   . Breast cancer dx'd 10/01/2013    right; triplen negative  . Allergy   . Asthma 20's    allergic asthema   Past Surgical History  Procedure Laterality Date  . Laparoscopic endometriosis fulguration  1991  . Portacath placement N/A 10/18/2013    Procedure: INSERTION PORT-A-CATH WITH ULTRASOUND;  Surgeon: Adin Hector,  MD;  Location: [facility];  Service: General;  Laterality: N/A;  . Partial mastectomy with needle localization and axillary sentinel lymph node bx Right 04/08/2014    Procedure: PARTIAL MASTECTOMY WITH NEEDLE LOCALIZATION AND AXILLARY SENTINEL LYMPH NODE BIOPSY;  Surgeon: Adin Hector, MD;  Location: [facility];  Service: General;  Laterality: Right;  . Abdominal hysterectomy  1992    1 ovary left   Family History  Problem Relation Age of Onset  . Lung cancer Maternal Uncle     smoker  . Breast cancer Paternal Aunt     dx in her late 12s; maternal half sister to patient's father  . Breast cancer Maternal Grandmother 72  . Lung cancer Maternal Uncle     smoker  . Hypertension Father   . Heart disease Father   . Parkinsonism Father   . Skin cancer Father   . Hypercholesterolemia Mother   . Gallstones Mother   . Healthy Brother   . COPD Maternal Grandfather   . Lung cancer Cousin     smoker; paternal cousin   History  Substance Use Topics  . Smoking status: Never Smoker   . Smokeless tobacco: Never Used  . Alcohol Use: No     Comment: once a month   OB History   Grav Para Term Preterm Abortions TAB SAB Ect Mult Living                 Review of Systems  Constitutional: Negative for fever and chills.  Respiratory: Negative for  shortness of breath.   Cardiovascular: Negative for chest pain.  Gastrointestinal: Negative for nausea, vomiting and abdominal pain.  Musculoskeletal: Positive for arthralgias, gait problem and myalgias.  All other systems reviewed and are negative.     Allergies  Penicillins  Home Medications   Prior to Admission medications   Medication Sig Start Date End Date Taking? Authorizing Provider  B Complex-C (SUPER B COMPLEX PO) Take 1 tablet by mouth daily.    Yes Historical Provider, MD  cholecalciferol (VITAMIN D) 400 UNITS TABS tablet Take 800 Units by mouth daily.    Yes Historical Provider, MD  hyaluronate sodium (RADIAPLEXRX)  GEL Apply 1 application topically 3 (three) times daily.   Yes Historical Provider, MD  ibuprofen (ADVIL,MOTRIN) 200 MG tablet Take 400 mg by mouth every 6 (six) hours as needed for moderate pain.   Yes Historical Provider, MD  pregabalin (LYRICA) 100 MG capsule Take 1 capsule (100 mg total) by mouth 2 (two) times daily. 04/22/14  Yes Chauncey Cruel, MD  PRESCRIPTION MEDICATION PACLitaxel (TAXOL) 162 mg in sodium chloride 0.9 % 250 mL chemo infusion (</= 80mg /m2) 80 mg/m2  2.02 m2 (Treatment Plan Actual)  Once 03/11/2014   Yes Historical Provider, MD  PRESCRIPTION MEDICATION CARBOplatin (PARAPLATIN) 300 mg in sodium chloride 0.9 % 100 mL chemo infusion 300 mg  Once 03/11/2014   Yes Historical Provider, MD  SENNA PO Take 2 tablets by mouth daily as needed (For constipation).    Yes Historical Provider, MD  VITAMIN E PO Take 1 capsule by mouth daily.   Yes Historical Provider, MD   BP 126/77  Pulse 91  Temp(Src) 98.9 F (37.2 C) (Oral)  Resp 22  SpO2 98% Physical Exam  Nursing note and vitals reviewed. Constitutional: She is oriented to person, place, and time. She appears well-developed and well-nourished. No distress.  HENT:  Head: Normocephalic and atraumatic.  Right Ear: External ear normal.  Left Ear: External ear normal.  Nose: Nose normal.  Mouth/Throat: Oropharynx is clear and moist.  Eyes: Conjunctivae are normal.  Neck: Normal range of motion.  Cardiovascular: Normal rate, regular rhythm, normal heart sounds, intact distal pulses and normal pulses.   Pulses:      Dorsalis pedis pulses are 2+ on the right side, and 2+ on the left side.       Posterior tibial pulses are 2+ on the right side, and 2+ on the left side.  Pulmonary/Chest: Effort normal and breath sounds normal. No stridor. No respiratory distress. She has no wheezes. She has no rales.  Abdominal: Soft. She exhibits no distension.  Musculoskeletal: Normal range of motion.  Squeeze test negative on the  left. Tenderness to palpation over both medial and lateral malleolus on the left. Tenderness to palpation of her left heel. Neurovascularly intact. Compartment soft. Joint stable. No bruising or ecchymosis seen at this time.   Neurological: She is alert and oriented to person, place, and time. She has normal strength.  Skin: Skin is warm and dry. She is not diaphoretic. No erythema.  Psychiatric: She has a normal mood and affect. Her behavior is normal.    ED Course  Procedures (including critical care time) Labs Review Labs Reviewed - No data to display  Imaging Review Dg Ankle Complete Left  05/12/2014   CLINICAL DATA:  Fall  EXAM: LEFT ANKLE COMPLETE - 3+ VIEW  COMPARISON:  None.  FINDINGS: There is an acute oblique fracture through the distal fibula extending into the ankle joint. The  fracture begins at the tibial plafond the and extends superiorly and posteriorly. The ankle mortise remains anatomic. Tibia, talus, and calcaneus are intact.  IMPRESSION: Lateral malleolus fracture.  Weber B injury.   Electronically Signed   By: Maryclare Bean M.D.   On: 05/12/2014 09:08     EKG Interpretation None      MDM   Final diagnoses:  Fracture of lateral malleolus of left ankle    Patient presents with stable fracture of lateral malleolus of left ankle. Neurovascularly intact. Compartment soft. Patient will be placed in posterior splint and given crutches. RICE. Given ortho follow up. Return instructions given. Vital signs stable for discharge. Discussed case with Dr. Wilson Singer who agrees with plan. Patient / Family / Caregiver informed of clinical course, understand medical decision-making process, and agree with plan.    Elwyn Lade, PA-C 05/12/14 [phone]

## 2014-05-12 NOTE — Discharge Instructions (Signed)
Ankle Fracture °A fracture is a break in a bone. A cast or splint may be used to protect the ankle and heal the break. Sometimes, surgery is needed. °HOME CARE °· Use crutches as told by your doctor. It is very important that you use your crutches correctly. °· Do not put weight or pressure on the injured ankle until told by your doctor. °· Keep your ankle raised (elevated) when sitting or lying down. °· Apply ice to the ankle: °· Put ice in a plastic bag. °· Place a towel between your cast and the bag. °· Leave the ice on for 20 minutes, 2 3 times a day. °· If you have a plaster or fiberglass cast: °· Do not try to scratch under the cast with any objects. °· Check the skin around the cast every day. You may put lotion on red or sore areas. °· Keep your cast dry and clean. °· If you have a plaster splint: °· Wear the splint as told by your doctor. °· You can loosen the elastic around the splint if your toes get numb, tingle, or turn cold or blue. °· Do not put pressure on any part of your cast or splint. It may break. Rest your plaster splint or cast only on a pillow the first 24 hours until it is fully hardened. °· Cover your cast or splint with a plastic bag during showers. °· Do not lower your cast or splint into water. °· Take medicine as told by your doctor. °· Do not drive until your doctor says it is safe. °· Follow-up with your doctor as told. It is very important that you go to your follow-up visits. °GET HELP IF: °The swelling and discomfort gets worse.  °GET HELP RIGHT AWAY IF:  °· Your splint or cast breaks. °· You continue to have very bad pain. °· You have new pain or swelling after your splint or cast was put on. °· Your skin or toes below the injured ankle: °· Turn blue or gray. °· Feel cold, numb, or you cannot feel them. °· There is a bad smell or yellowish white fluid (pus) coming from under the splint or cast. °MAKE SURE YOU:  °· Understand these instructions. °· Will watch your  condition. °· Will get help right away if you are not doing well or get worse. °Document Released: 10/13/2009 Document Revised: 10/06/2013 Document Reviewed: 07/15/2013 °[employer]® Patient Information ©2014 [employer]. ° °Cast or Splint Care °Casts and splints support injured limbs and keep bones from moving while they heal.  °HOME CARE °· Keep the cast or splint uncovered during the drying period. °· A plaster cast can take 24 to 48 hours to dry. °· A fiberglass cast will dry in less than 1 hour. °· Do not rest the cast on anything harder than a pillow for 24 hours. °· Do not put weight on your injured limb. Do not put pressure on the cast. Wait for your doctor's approval. °· Keep the cast or splint dry. °· Cover the cast or splint with a plastic bag during baths or wet weather. °· If you have a cast over your chest and belly (trunk), take sponge baths until the cast is taken off. °· If your cast gets wet, dry it with a towel or blow dryer. Use the cool setting on the blow dryer. °· Keep your cast or splint clean. Wash a dirty cast with a damp cloth. °· Do not put any objects under your cast or   splint. °· Do not scratch the skin under the cast with an object. If itching is a problem, use a blow dryer on a cool setting over the itchy area. °· Do not trim or cut your cast. °· Do not take out the padding from inside your cast. °· Exercise your joints near the cast as told by your doctor. °· Raise (elevate) your injured limb on 1 or 2 pillows for the first 1 to 3 days. °GET HELP IF: °· Your cast or splint cracks. °· Your cast or splint is too tight or too loose. °· You itch badly under the cast. °· Your cast gets wet or has a soft spot. °· You have a bad smell coming from the cast. °· You get an object stuck under the cast. °· Your skin around the cast becomes red or sore. °· You have new or more pain after the cast is put on. °GET HELP RIGHT AWAY IF: °· You have fluid leaking through the cast. °· You cannot move  your fingers or toes. °· Your fingers or toes turn blue or white or are cool, painful, or puffy (swollen). °· You have tingling or lose feeling (numbness) around the injured area. °· You have bad pain or pressure under the cast. °· You have trouble breathing or have shortness of breath. °· You have chest pain. °Document Released: 04/17/2011 Document Revised: 08/18/2013 Document Reviewed: 06/24/2013 °Exit[employer]® Patient Information ©2014 [employer]. ° °

## 2014-05-13 ENCOUNTER — Ambulatory Visit
Admission: RE | Admit: 2014-05-13 | Discharge: 2014-05-13 | Disposition: A | Discharge status: Home / Self Care | Payer: BC Managed Care – PPO | Source: Ambulatory Visit | Attending: Radiation Oncology | Admitting: Radiation Oncology

## 2014-05-13 VITALS — BP 103/72 | HR 81 | Temp 98.1°F | Ht 66.0 in

## 2014-05-13 DIAGNOSIS — C50311 Malignant neoplasm of lower-inner quadrant of right female breast: Secondary | ICD-10-CM

## 2014-05-13 NOTE — Progress Notes (Signed)
Natalie Arias has had 5 fractions to her right breast.  She denies pain.  Yesterday she fell down the stair at home and broke her left ankle.  She is in a wheelchair today.  She reports that the skin on her right breast gets red after treatment that fades at night.  She is using radiaplex gel.

## 2014-05-13 NOTE — Progress Notes (Signed)
   Department of Radiation Oncology  Phone:  [phone] Fax:        [phone]  Weekly Treatment Note    Name: Natalie Arias Date: 05/13/2014 MRN: [id number] DOB: 1961/05/10   Current dose: 9 Gy  Current fraction: 5   MEDICATIONS: Current Outpatient Prescriptions  Medication Sig Dispense Refill  . B Complex-C (SUPER B COMPLEX PO) Take 1 tablet by mouth daily.       . cholecalciferol (VITAMIN D) 400 UNITS TABS tablet Take 800 Units by mouth daily.       . hyaluronate sodium (RADIAPLEXRX) GEL Apply 1 application topically 3 (three) times daily.      Marland Kitchen ibuprofen (ADVIL,MOTRIN) 200 MG tablet Take 400 mg by mouth every 6 (six) hours as needed for moderate pain.      Marland Kitchen ondansetron (ZOFRAN) 4 MG tablet Take 1 tablet (4 mg total) by mouth every 6 (six) hours.  12 tablet  0  . oxyCODONE-acetaminophen (PERCOCET/ROXICET) 5-325 MG per tablet Take 1-2 tablets by mouth every 6 (six) hours as needed for severe pain.  30 tablet  0  . pregabalin (LYRICA) 100 MG capsule Take 1 capsule (100 mg total) by mouth 2 (two) times daily.  60 capsule  3  . SENNA PO Take 2 tablets by mouth daily as needed (For constipation).       Marland Kitchen VITAMIN E PO Take 1 capsule by mouth daily.      Marland Kitchen PRESCRIPTION MEDICATION PACLitaxel (TAXOL) 162 mg in sodium chloride 0.9 % 250 mL chemo infusion (</= 80mg /m2) 80 mg/m2  2.02 m2 (Treatment Plan Actual)  Once 03/11/2014      . PRESCRIPTION MEDICATION CARBOplatin (PARAPLATIN) 300 mg in sodium chloride 0.9 % 100 mL chemo infusion 300 mg  Once 03/11/2014       No current facility-administered medications for this encounter.     ALLERGIES: Penicillins   LABORATORY DATA:  Lab Results  Component Value Date   WBC 5.1 04/22/2014   HGB 11.3* 04/22/2014   HCT 34.3* 04/22/2014   MCV 94.0 04/22/2014   PLT 236 04/22/2014   Lab Results  Component Value Date   NA 143 04/22/2014   K 3.8 04/22/2014   CL 105 10/14/2013   CO2 25 04/22/2014   Lab Results  Component Value Date   ALT  28 04/22/2014   AST 26 04/22/2014   ALKPHOS 84 04/22/2014   BILITOT 0.55 04/22/2014     NARRATIVE: Natalie Arias was seen today for weekly treatment management. The chart was checked and the patient's films were reviewed. The patient fell yesterday and broke her ankle. No difficulties with radiation treatment so far and her first week. No ongoing skin changes.  PHYSICAL EXAMINATION: height is 5\' 6"  (1.676 m). Her temperature is 98.1 F (36.7 C). Her blood pressure is 103/72 and her pulse is 81.        ASSESSMENT: The patient is doing satisfactorily with treatment.  PLAN: We will continue with the patient's radiation treatment as planned.

## 2014-05-16 ENCOUNTER — Ambulatory Visit
Admission: RE | Admit: 2014-05-16 | Discharge: 2014-05-16 | Disposition: A | Discharge status: Home / Self Care | Payer: BC Managed Care – PPO | Source: Ambulatory Visit | Attending: Radiation Oncology | Admitting: Radiation Oncology

## 2014-05-16 NOTE — ED Provider Notes (Signed)
Medical screening examination/treatment/procedure(s) were performed by non-physician practitioner and as supervising physician I was immediately available for consultation/collaboration.   EKG Interpretation None       Virgel Manifold, MD 05/16/14 [phone]

## 2014-05-17 ENCOUNTER — Ambulatory Visit
Admission: RE | Admit: 2014-05-17 | Discharge: 2014-05-17 | Disposition: A | Discharge status: Home / Self Care | Payer: BC Managed Care – PPO | Source: Ambulatory Visit | Attending: Radiation Oncology | Admitting: Radiation Oncology

## 2014-05-18 ENCOUNTER — Ambulatory Visit
Admission: RE | Admit: 2014-05-18 | Discharge: 2014-05-18 | Disposition: A | Discharge status: Home / Self Care | Payer: BC Managed Care – PPO | Source: Ambulatory Visit | Attending: Radiation Oncology | Admitting: Radiation Oncology

## 2014-05-19 ENCOUNTER — Ambulatory Visit
Admission: RE | Admit: 2014-05-19 | Discharge: 2014-05-19 | Disposition: A | Discharge status: Home / Self Care | Payer: BC Managed Care – PPO | Source: Ambulatory Visit | Attending: Radiation Oncology | Admitting: Radiation Oncology

## 2014-05-20 ENCOUNTER — Ambulatory Visit (HOSPITAL_BASED_OUTPATIENT_CLINIC_OR_DEPARTMENT_OTHER): Payer: BC Managed Care – PPO

## 2014-05-20 ENCOUNTER — Ambulatory Visit
Admission: RE | Admit: 2014-05-20 | Discharge: 2014-05-20 | Disposition: A | Discharge status: Home / Self Care | Payer: BC Managed Care – PPO | Source: Ambulatory Visit | Attending: Radiation Oncology | Admitting: Radiation Oncology

## 2014-05-20 ENCOUNTER — Ambulatory Visit: Payer: BC Managed Care – PPO | Admitting: Radiation Oncology

## 2014-05-20 VITALS — BP 121/71 | HR 95 | Temp 97.8°F

## 2014-05-20 DIAGNOSIS — C50219 Malignant neoplasm of upper-inner quadrant of unspecified female breast: Secondary | ICD-10-CM

## 2014-05-20 DIAGNOSIS — Z95828 Presence of other vascular implants and grafts: Secondary | ICD-10-CM

## 2014-05-20 DIAGNOSIS — Z452 Encounter for adjustment and management of vascular access device: Secondary | ICD-10-CM

## 2014-05-20 DIAGNOSIS — C50311 Malignant neoplasm of lower-inner quadrant of right female breast: Secondary | ICD-10-CM

## 2014-05-20 MED ORDER — HEPARIN SOD (PORK) LOCK FLUSH 100 UNIT/ML IV SOLN
500.0000 [IU] | Freq: Once | INTRAVENOUS | Status: AC
  Administered 2014-05-20: 500 [IU] via INTRAVENOUS
  Filled 2014-05-20: qty 5

## 2014-05-20 MED ORDER — SODIUM CHLORIDE 0.9 % IJ SOLN
10.0000 mL | INTRAMUSCULAR | Status: DC | PRN
  Administered 2014-05-20: 10 mL via INTRAVENOUS
  Filled 2014-05-20: qty 10

## 2014-05-20 NOTE — Progress Notes (Signed)
Weekly rad txs right breast 10 txs completed, erythma mild on breast,using radiaplex gel  Bid, no nausea, no pain in breast, appetite good, left foot in bandage using knee scooter helping patient, was nauseated last Friday after radtx , was hot and had too much going on stated 2:54 PM  .

## 2014-05-20 NOTE — Progress Notes (Signed)
   Department of Radiation Oncology  Phone:  [phone] Fax:        [phone]  Weekly Treatment Note    Name: Natalie Arias [facility] Date: 05/20/2014 MRN: [id number] DOB: 1961-05-07   Current dose: 18 Gy  Current fraction:10   MEDICATIONS: Current Outpatient Prescriptions  Medication Sig Dispense Refill  . B Complex-C (SUPER B COMPLEX PO) Take 1 tablet by mouth daily.       . cholecalciferol (VITAMIN D) 400 UNITS TABS tablet Take 800 Units by mouth daily.       . hyaluronate sodium (RADIAPLEXRX) GEL Apply 1 application topically 3 (three) times daily.      Marland Kitchen ibuprofen (ADVIL,MOTRIN) 200 MG tablet Take 400 mg by mouth every 6 (six) hours as needed for moderate pain.      Marland Kitchen ondansetron (ZOFRAN) 4 MG tablet Take 1 tablet (4 mg total) by mouth every 6 (six) hours.  12 tablet  0  . oxyCODONE-acetaminophen (PERCOCET/ROXICET) 5-325 MG per tablet Take 1-2 tablets by mouth every 6 (six) hours as needed for severe pain.  30 tablet  0  . pregabalin (LYRICA) 100 MG capsule Take 1 capsule (100 mg total) by mouth 2 (two) times daily.  60 capsule  3  . PRESCRIPTION MEDICATION PACLitaxel (TAXOL) 162 mg in sodium chloride 0.9 % 250 mL chemo infusion (</= 80mg /m2) 80 mg/m2  2.02 m2 (Treatment Plan Actual)  Once 03/11/2014      . PRESCRIPTION MEDICATION CARBOplatin (PARAPLATIN) 300 mg in sodium chloride 0.9 % 100 mL chemo infusion 300 mg  Once 03/11/2014      . SENNA PO Take 2 tablets by mouth daily as needed (For constipation).       Marland Kitchen VITAMIN E PO Take 1 capsule by mouth daily.       No current facility-administered medications for this encounter.     ALLERGIES: Penicillins   LABORATORY DATA:  Lab Results  Component Value Date   WBC 5.1 04/22/2014   HGB 11.3* 04/22/2014   HCT 34.3* 04/22/2014   MCV 94.0 04/22/2014   PLT 236 04/22/2014   Lab Results  Component Value Date   NA 143 04/22/2014   K 3.8 04/22/2014   CL 105 10/14/2013   CO2 25 04/22/2014   Lab Results  Component Value Date   ALT  28 04/22/2014   AST 26 04/22/2014   ALKPHOS 84 04/22/2014   BILITOT 0.55 04/22/2014     NARRATIVE: Natalie Arias was seen today for weekly treatment management. The chart was checked and the patient's films were reviewed. The patient is doing very well with no significant complaints. She continues to use skin cream daily.  PHYSICAL EXAMINATION: vitals were not taken for this visit.     some erythema has emerged in the treatment area which is mild at this point.  ASSESSMENT: The patient is doing satisfactorily with treatment.  PLAN: We will continue with the patient's radiation treatment as planned.

## 2014-05-24 ENCOUNTER — Ambulatory Visit
Admission: RE | Admit: 2014-05-24 | Discharge: 2014-05-24 | Disposition: A | Discharge status: Home / Self Care | Payer: BC Managed Care – PPO | Source: Ambulatory Visit | Attending: Radiation Oncology | Admitting: Radiation Oncology

## 2014-05-25 ENCOUNTER — Ambulatory Visit
Admission: RE | Admit: 2014-05-25 | Discharge: 2014-05-25 | Disposition: A | Discharge status: Home / Self Care | Payer: BC Managed Care – PPO | Source: Ambulatory Visit | Attending: Radiation Oncology | Admitting: Radiation Oncology

## 2014-05-26 ENCOUNTER — Ambulatory Visit
Admission: RE | Admit: 2014-05-26 | Discharge: 2014-05-26 | Disposition: A | Discharge status: Home / Self Care | Payer: BC Managed Care – PPO | Source: Ambulatory Visit | Attending: Radiation Oncology | Admitting: Radiation Oncology

## 2014-05-27 ENCOUNTER — Ambulatory Visit
Admission: RE | Admit: 2014-05-27 | Discharge: 2014-05-27 | Disposition: A | Discharge status: Home / Self Care | Payer: BC Managed Care – PPO | Source: Ambulatory Visit | Attending: Radiation Oncology | Admitting: Radiation Oncology

## 2014-05-27 VITALS — BP 107/74 | HR 71 | Temp 98.0°F

## 2014-05-27 DIAGNOSIS — C50311 Malignant neoplasm of lower-inner quadrant of right female breast: Secondary | ICD-10-CM

## 2014-05-27 NOTE — Progress Notes (Signed)
Patient for weekly assessment of radiation to right breast.Completed 14 of 28 treatments.skin is pink.No peeling.Denies pain.Mild fatigue resolved with nap.

## 2014-05-27 NOTE — Progress Notes (Signed)
   Department of Radiation Oncology  Phone:  [phone] Fax:        [phone]  Weekly Treatment Note    Name: Natalie Arias [facility] Date: 05/27/2014 MRN: [id number] DOB: [birth year]   Current dose: 25.2 Gy  Current fraction: 14   MEDICATIONS: Current Outpatient Prescriptions  Medication Sig Dispense Refill  . aspirin 81 MG tablet Take 81 mg by mouth daily.      . B Complex-C (SUPER B COMPLEX PO) Take 1 tablet by mouth daily.       . cholecalciferol (VITAMIN D) 400 UNITS TABS tablet Take 800 Units by mouth daily.       . hyaluronate sodium (RADIAPLEXRX) GEL Apply 1 application topically 3 (three) times daily.      Marland Kitchen ibuprofen (ADVIL,MOTRIN) 200 MG tablet Take 400 mg by mouth every 6 (six) hours as needed for moderate pain.      Marland Kitchen ondansetron (ZOFRAN) 4 MG tablet Take 1 tablet (4 mg total) by mouth every 6 (six) hours.  12 tablet  0  . oxyCODONE-acetaminophen (PERCOCET/ROXICET) 5-325 MG per tablet Take 1-2 tablets by mouth every 6 (six) hours as needed for severe pain.  30 tablet  0  . pregabalin (LYRICA) 100 MG capsule Take 1 capsule (100 mg total) by mouth 2 (two) times daily.  60 capsule  3  . SENNA PO Take 2 tablets by mouth daily as needed (For constipation).       Marland Kitchen VITAMIN E PO Take 1 capsule by mouth daily.      Marland Kitchen PRESCRIPTION MEDICATION PACLitaxel (TAXOL) 162 mg in sodium chloride 0.9 % 250 mL chemo infusion (</= 80mg /m2) 80 mg/m2  2.02 m2 (Treatment Plan Actual)  Once 03/11/2014      . PRESCRIPTION MEDICATION CARBOplatin (PARAPLATIN) 300 mg in sodium chloride 0.9 % 100 mL chemo infusion 300 mg  Once 03/11/2014       No current facility-administered medications for this encounter.     ALLERGIES: Penicillins   LABORATORY DATA:  Lab Results  Component Value Date   WBC 5.1 04/22/2014   HGB 11.3* 04/22/2014   HCT 34.3* 04/22/2014   MCV 94.0 04/22/2014   PLT 236 04/22/2014   Lab Results  Component Value Date   NA 143 04/22/2014   K 3.8 04/22/2014   CL 105 10/14/2013   CO2  25 04/22/2014   Lab Results  Component Value Date   ALT 28 04/22/2014   AST 26 04/22/2014   ALKPHOS 84 04/22/2014   BILITOT 0.55 04/22/2014     NARRATIVE: Calyn P Stigger was seen today for weekly treatment management. The chart was checked and the patient's films were reviewed. The patient states she is doing very well. Some mild fatigue. No significant problems with skin irritation.  PHYSICAL EXAMINATION: temperature is 98 F (36.7 C). Her blood pressure is 107/74 and her pulse is 71. Her oxygen saturation is 97%.      some diffuse mild erythema is present. Overall her skin looks are good.  ASSESSMENT: The patient is doing satisfactorily with treatment.  PLAN: We will continue with the patient's radiation treatment as planned.

## 2014-05-30 ENCOUNTER — Ambulatory Visit
Admission: RE | Admit: 2014-05-30 | Discharge: 2014-05-30 | Disposition: A | Discharge status: Home / Self Care | Payer: BC Managed Care – PPO | Source: Ambulatory Visit | Attending: Radiation Oncology | Admitting: Radiation Oncology

## 2014-05-31 ENCOUNTER — Ambulatory Visit
Admission: RE | Admit: 2014-05-31 | Discharge: 2014-05-31 | Disposition: A | Discharge status: Home / Self Care | Payer: BC Managed Care – PPO | Source: Ambulatory Visit | Attending: Radiation Oncology | Admitting: Radiation Oncology

## 2014-06-01 ENCOUNTER — Ambulatory Visit
Admission: RE | Admit: 2014-06-01 | Discharge: 2014-06-01 | Disposition: A | Discharge status: Home / Self Care | Payer: BC Managed Care – PPO | Source: Ambulatory Visit | Attending: Radiation Oncology | Admitting: Radiation Oncology

## 2014-06-02 ENCOUNTER — Ambulatory Visit
Admission: RE | Admit: 2014-06-02 | Discharge: 2014-06-02 | Disposition: A | Discharge status: Home / Self Care | Payer: BC Managed Care – PPO | Source: Ambulatory Visit | Attending: Radiation Oncology | Admitting: Radiation Oncology

## 2014-06-03 ENCOUNTER — Ambulatory Visit
Admission: RE | Admit: 2014-06-03 | Discharge: 2014-06-03 | Disposition: A | Discharge status: Home / Self Care | Payer: BC Managed Care – PPO | Source: Ambulatory Visit | Attending: Radiation Oncology | Admitting: Radiation Oncology

## 2014-06-03 ENCOUNTER — Ambulatory Visit
Admission: RE | Admit: 2014-06-03 | Payer: BC Managed Care – PPO | Source: Ambulatory Visit | Admitting: Radiation Oncology

## 2014-06-03 VITALS — BP 112/60 | HR 80 | Temp 98.7°F | Resp 12

## 2014-06-03 DIAGNOSIS — C50311 Malignant neoplasm of lower-inner quadrant of right female breast: Secondary | ICD-10-CM

## 2014-06-03 MED ORDER — RADIAPLEXRX EX GEL
Freq: Once | CUTANEOUS | Status: AC
  Administered 2014-06-03: 12:00:00 via TOPICAL

## 2014-06-03 NOTE — Progress Notes (Signed)
   Department of Radiation Oncology  Phone:  [phone] Fax:        [phone]  Weekly Treatment Note    Name: Natalie Arias [facility] Date: 06/03/2014 MRN: [id number] DOB: 13-Feb-1961   Current dose: 34.2 Gy  Current fraction: 19   MEDICATIONS: Current Outpatient Prescriptions  Medication Sig Dispense Refill  . aspirin 81 MG tablet Take 81 mg by mouth daily.      . B Complex-C (SUPER B COMPLEX PO) Take 1 tablet by mouth daily.       . cholecalciferol (VITAMIN D) 400 UNITS TABS tablet Take 800 Units by mouth daily.       . hyaluronate sodium (RADIAPLEXRX) GEL Apply 1 application topically 3 (three) times daily.      Marland Kitchen ibuprofen (ADVIL,MOTRIN) 200 MG tablet Take 400 mg by mouth every 6 (six) hours as needed for moderate pain.      Marland Kitchen ondansetron (ZOFRAN) 4 MG tablet Take 1 tablet (4 mg total) by mouth every 6 (six) hours.  12 tablet  0  . oxyCODONE-acetaminophen (PERCOCET/ROXICET) 5-325 MG per tablet Take 1-2 tablets by mouth every 6 (six) hours as needed for severe pain.  30 tablet  0  . pregabalin (LYRICA) 100 MG capsule Take 1 capsule (100 mg total) by mouth 2 (two) times daily.  60 capsule  3  . PRESCRIPTION MEDICATION PACLitaxel (TAXOL) 162 mg in sodium chloride 0.9 % 250 mL chemo infusion (</= 80mg /m2) 80 mg/m2  2.02 m2 (Treatment Plan Actual)  Once 03/11/2014      . PRESCRIPTION MEDICATION CARBOplatin (PARAPLATIN) 300 mg in sodium chloride 0.9 % 100 mL chemo infusion 300 mg  Once 03/11/2014      . SENNA PO Take 2 tablets by mouth daily as needed (For constipation).       Marland Kitchen VITAMIN E PO Take 1 capsule by mouth daily.       No current facility-administered medications for this encounter.     ALLERGIES: Penicillins   LABORATORY DATA:  Lab Results  Component Value Date   WBC 5.1 04/22/2014   HGB 11.3* 04/22/2014   HCT 34.3* 04/22/2014   MCV 94.0 04/22/2014   PLT 236 04/22/2014   Lab Results  Component Value Date   NA 143 04/22/2014   K 3.8 04/22/2014   CL 105 10/14/2013   CO2  25 04/22/2014   Lab Results  Component Value Date   ALT 28 04/22/2014   AST 26 04/22/2014   ALKPHOS 84 04/22/2014   BILITOT 0.55 04/22/2014     NARRATIVE: Natalie Arias was seen today for weekly treatment management. The chart was checked and the patient's films were reviewed. The patient states she is doing well. She has noticed some increased irritation of the skin. Denies significant pain.  PHYSICAL EXAMINATION: oral temperature is 98.7 F (37.1 C). Her blood pressure is 112/60 and her pulse is 80. Her respiration is 12 and oxygen saturation is 96%.      some early desquamation is present in the inframammary region.  ASSESSMENT: The patient is doing satisfactorily with treatment.  PLAN: We will continue with the patient's radiation treatment as planned. The patient will begin using Neosporin underneath the breast and also has been given Biafine cream.

## 2014-06-03 NOTE — Progress Notes (Signed)
Weekly assessment of right breast radiation.  Completed 19/28 treatments.  Pt denies pain, but is experiencing tenderness.  Skin is red and there is a slight peal under right mammary fold.  Pt given another tube of radiaplex.

## 2014-06-06 ENCOUNTER — Ambulatory Visit
Admission: RE | Admit: 2014-06-06 | Discharge: 2014-06-06 | Disposition: A | Discharge status: Home / Self Care | Payer: BC Managed Care – PPO | Source: Ambulatory Visit | Attending: Radiation Oncology | Admitting: Radiation Oncology

## 2014-06-07 ENCOUNTER — Ambulatory Visit
Admission: RE | Admit: 2014-06-07 | Discharge: 2014-06-07 | Disposition: A | Discharge status: Home / Self Care | Payer: BC Managed Care – PPO | Source: Ambulatory Visit | Attending: Radiation Oncology | Admitting: Radiation Oncology

## 2014-06-08 ENCOUNTER — Ambulatory Visit
Admission: RE | Admit: 2014-06-08 | Discharge: 2014-06-08 | Disposition: A | Discharge status: Home / Self Care | Payer: BC Managed Care – PPO | Source: Ambulatory Visit | Attending: Radiation Oncology | Admitting: Radiation Oncology

## 2014-06-09 ENCOUNTER — Encounter: Payer: Self-pay | Admitting: Radiation Oncology

## 2014-06-09 ENCOUNTER — Ambulatory Visit
Admission: RE | Admit: 2014-06-09 | Discharge: 2014-06-09 | Disposition: A | Discharge status: Home / Self Care | Payer: BC Managed Care – PPO | Source: Ambulatory Visit | Attending: Radiation Oncology | Admitting: Radiation Oncology

## 2014-06-09 ENCOUNTER — Ambulatory Visit: Payer: BC Managed Care – PPO | Admitting: Radiation Oncology

## 2014-06-09 DIAGNOSIS — C50311 Malignant neoplasm of lower-inner quadrant of right female breast: Secondary | ICD-10-CM

## 2014-06-09 MED ORDER — BIAFINE EX EMUL
Freq: Two times a day (BID) | CUTANEOUS | Status: DC
  Administered 2014-06-09: 17:00:00 via TOPICAL

## 2014-06-09 NOTE — Progress Notes (Signed)
Patient given biafine cream.  Her right breast is red with a rash on the top portion.  Patient would like to try biafine instead of radiaplex.

## 2014-06-10 ENCOUNTER — Ambulatory Visit
Admission: RE | Admit: 2014-06-10 | Discharge: 2014-06-10 | Disposition: A | Discharge status: Home / Self Care | Payer: BC Managed Care – PPO | Source: Ambulatory Visit | Attending: Radiation Oncology | Admitting: Radiation Oncology

## 2014-06-10 VITALS — BP 115/80 | HR 93 | Temp 98.8°F | Ht 66.0 in

## 2014-06-10 DIAGNOSIS — C50311 Malignant neoplasm of lower-inner quadrant of right female breast: Secondary | ICD-10-CM

## 2014-06-10 NOTE — Progress Notes (Signed)
Natalie Arias has completed 24 fractions to her right breast.  She denies pain but does have discomfort underneath her right arm.  She reports fatigue.  She started using biafine cream yesterday and says that the rash on her right chest feels better.  The skin on her right breast/underarm is red.  She has a raised rash on her upper right breast.  She also has two peeling areas underneath her breast.  She is using neosporin on the peeling areas.

## 2014-06-12 NOTE — Progress Notes (Signed)
   Department of Radiation Oncology  Phone:  [phone] Fax:        [phone]  Weekly Treatment Note    Name: [facility] Date: 06/12/2014 MRN: [id number] DOB: 1961-01-29   Current dose: 43.2 Gy  Current fraction: 24   MEDICATIONS: Current Outpatient Prescriptions  Medication Sig Dispense Refill  . aspirin 81 MG tablet Take 81 mg by mouth daily.      . B Complex-C (SUPER B COMPLEX PO) Take 1 tablet by mouth daily.       . cholecalciferol (VITAMIN D) 400 UNITS TABS tablet Take 800 Units by mouth daily.       Marland Kitchen emollient (BIAFINE) cream Apply topically 2 (two) times daily.      Marland Kitchen ibuprofen (ADVIL,MOTRIN) 200 MG tablet Take 400 mg by mouth every 6 (six) hours as needed for moderate pain.      Marland Kitchen ondansetron (ZOFRAN) 4 MG tablet Take 1 tablet (4 mg total) by mouth every 6 (six) hours.  12 tablet  0  . oxyCODONE-acetaminophen (PERCOCET/ROXICET) 5-325 MG per tablet Take 1-2 tablets by mouth every 6 (six) hours as needed for severe pain.  30 tablet  0  . pregabalin (LYRICA) 100 MG capsule Take 1 capsule (100 mg total) by mouth 2 (two) times daily.  60 capsule  3  . SENNA PO Take 2 tablets by mouth daily as needed (For constipation).       Marland Kitchen VITAMIN E PO Take 1 capsule by mouth daily.      . hyaluronate sodium (RADIAPLEXRX) GEL Apply 1 application topically 3 (three) times daily.      Marland Kitchen PRESCRIPTION MEDICATION PACLitaxel (TAXOL) 162 mg in sodium chloride 0.9 % 250 mL chemo infusion (</= 80mg /m2) 80 mg/m2  2.02 m2 (Treatment Plan Actual)  Once 03/11/2014      . PRESCRIPTION MEDICATION CARBOplatin (PARAPLATIN) 300 mg in sodium chloride 0.9 % 100 mL chemo infusion 300 mg  Once 03/11/2014       No current facility-administered medications for this encounter.     ALLERGIES: Penicillins   LABORATORY DATA:  Lab Results  Component Value Date   WBC 5.1 04/22/2014   HGB 11.3* 04/22/2014   HCT 34.3* 04/22/2014   MCV 94.0 04/22/2014   PLT 236 04/22/2014   Lab Results  Component Value  Date   NA 143 04/22/2014   K 3.8 04/22/2014   CL 105 10/14/2013   CO2 25 04/22/2014   Lab Results  Component Value Date   ALT 28 04/22/2014   AST 26 04/22/2014   ALKPHOS 84 04/22/2014   BILITOT 0.55 04/22/2014     NARRATIVE: Natalie Arias was seen today for weekly treatment management. The chart was checked and the patient's films were reviewed. The patient states she continues to do fairly well. She has noticed some increased irritation, especially in the upper axilla. She does report some fatigue.  PHYSICAL EXAMINATION: height is 5\' 6"  (1.676 m). Her oral temperature is 98.8 F (37.1 C). Her blood pressure is 115/80 and her pulse is 93.      patient's skin shows hyperpigmentation, greater in the axilla. Overall her skin continues to look quite good.   ASSESSMENT: The patient is doing satisfactorily with treatment.  PLAN: We will continue with the patient's radiation treatment as planned. The patient will use Neosporin on the areas of dry desquamation.

## 2014-06-13 ENCOUNTER — Ambulatory Visit
Admission: RE | Admit: 2014-06-13 | Discharge: 2014-06-13 | Disposition: A | Discharge status: Home / Self Care | Payer: BC Managed Care – PPO | Source: Ambulatory Visit | Attending: Radiation Oncology | Admitting: Radiation Oncology

## 2014-06-14 ENCOUNTER — Ambulatory Visit
Admission: RE | Admit: 2014-06-14 | Discharge: 2014-06-14 | Disposition: A | Discharge status: Home / Self Care | Payer: BC Managed Care – PPO | Source: Ambulatory Visit | Attending: Radiation Oncology | Admitting: Radiation Oncology

## 2014-06-15 ENCOUNTER — Encounter: Payer: Self-pay | Admitting: Radiation Oncology

## 2014-06-15 ENCOUNTER — Ambulatory Visit
Admission: RE | Admit: 2014-06-15 | Discharge: 2014-06-15 | Disposition: A | Discharge status: Home / Self Care | Payer: BC Managed Care – PPO | Source: Ambulatory Visit | Attending: Radiation Oncology | Admitting: Radiation Oncology

## 2014-06-15 NOTE — Progress Notes (Signed)
  Radiation Oncology         [phone] ________________________________  Name: Natalie Arias MRN: [id number]  Date: 06/16/2014  DOB: 1961/09/12  Weekly Radiation Therapy Management  Current Dose: 50.4 Gy     Planned Dose:  60.4 Gy  Narrative . . . . . . . . The patient presents for routine under treatment assessment.                                   The patient is without complaint.                                 Set-up films were reviewed.                                 The chart was checked. Physical Findings. . .  oral temperature is 98.6 F (37 C). Her blood pressure is 112/71 and her pulse is 106. Her respiration is 20. . Weight essentially stable.  No significant changes. Impression . . . . . . . The patient is tolerating radiation. Plan . . . . . . . . . . . . Continue treatment as planned.  ________________________________  Sheral Apley. Tammi Klippel, M.D.

## 2014-06-16 ENCOUNTER — Encounter: Payer: Self-pay | Admitting: Radiation Oncology

## 2014-06-16 ENCOUNTER — Ambulatory Visit
Admission: RE | Admit: 2014-06-16 | Discharge: 2014-06-16 | Disposition: A | Discharge status: Home / Self Care | Payer: BC Managed Care – PPO | Source: Ambulatory Visit | Attending: Radiation Oncology | Admitting: Radiation Oncology

## 2014-06-16 VITALS — BP 112/71 | HR 106 | Temp 98.6°F | Resp 20

## 2014-06-16 DIAGNOSIS — C50311 Malignant neoplasm of lower-inner quadrant of right female breast: Secondary | ICD-10-CM

## 2014-06-16 NOTE — Progress Notes (Signed)
Weekly rad txs  28/33 completed right breast, dry desquamation on niple area under inframmary fold ans axilla, peeling in those areas, uses neosporin where skin peeling, and biafine elsewhere on breast, tebder mostly under right axilla, appetite good, does get fatigued end of the day 4:33 PM

## 2014-06-17 ENCOUNTER — Ambulatory Visit
Admission: RE | Admit: 2014-06-17 | Discharge: 2014-06-17 | Disposition: A | Discharge status: Home / Self Care | Payer: BC Managed Care – PPO | Source: Ambulatory Visit | Attending: Radiation Oncology | Admitting: Radiation Oncology

## 2014-06-17 DIAGNOSIS — C50311 Malignant neoplasm of lower-inner quadrant of right female breast: Secondary | ICD-10-CM

## 2014-06-20 ENCOUNTER — Ambulatory Visit
Admission: RE | Admit: 2014-06-20 | Discharge: 2014-06-20 | Disposition: A | Discharge status: Home / Self Care | Payer: BC Managed Care – PPO | Source: Ambulatory Visit | Attending: Radiation Oncology | Admitting: Radiation Oncology

## 2014-06-21 ENCOUNTER — Ambulatory Visit
Admission: RE | Admit: 2014-06-21 | Discharge: 2014-06-21 | Disposition: A | Discharge status: Home / Self Care | Payer: BC Managed Care – PPO | Source: Ambulatory Visit | Attending: Radiation Oncology | Admitting: Radiation Oncology

## 2014-06-22 ENCOUNTER — Ambulatory Visit
Admission: RE | Admit: 2014-06-22 | Discharge: 2014-06-22 | Disposition: A | Discharge status: Home / Self Care | Payer: BC Managed Care – PPO | Source: Ambulatory Visit | Attending: Radiation Oncology | Admitting: Radiation Oncology

## 2014-06-23 ENCOUNTER — Encounter: Payer: Self-pay | Admitting: Adult Health

## 2014-06-23 ENCOUNTER — Encounter: Payer: Self-pay | Admitting: Radiation Oncology

## 2014-06-23 ENCOUNTER — Ambulatory Visit
Admission: RE | Admit: 2014-06-23 | Discharge: 2014-06-23 | Disposition: A | Discharge status: Home / Self Care | Payer: BC Managed Care – PPO | Source: Ambulatory Visit | Attending: Radiation Oncology | Admitting: Radiation Oncology

## 2014-06-23 ENCOUNTER — Other Ambulatory Visit (HOSPITAL_BASED_OUTPATIENT_CLINIC_OR_DEPARTMENT_OTHER): Payer: BC Managed Care – PPO

## 2014-06-23 ENCOUNTER — Ambulatory Visit (HOSPITAL_BASED_OUTPATIENT_CLINIC_OR_DEPARTMENT_OTHER): Payer: BC Managed Care – PPO | Admitting: Adult Health

## 2014-06-23 ENCOUNTER — Ambulatory Visit (HOSPITAL_BASED_OUTPATIENT_CLINIC_OR_DEPARTMENT_OTHER): Payer: BC Managed Care – PPO

## 2014-06-23 ENCOUNTER — Telehealth: Payer: Self-pay | Admitting: Adult Health

## 2014-06-23 VITALS — BP 118/75 | HR 94 | Temp 99.0°F | Ht 66.0 in

## 2014-06-23 VITALS — BP 107/73 | HR 118 | Temp 98.3°F | Resp 18 | Ht 66.0 in

## 2014-06-23 DIAGNOSIS — C50219 Malignant neoplasm of upper-inner quadrant of unspecified female breast: Secondary | ICD-10-CM

## 2014-06-23 DIAGNOSIS — C50311 Malignant neoplasm of lower-inner quadrant of right female breast: Secondary | ICD-10-CM

## 2014-06-23 DIAGNOSIS — Z95828 Presence of other vascular implants and grafts: Secondary | ICD-10-CM

## 2014-06-23 DIAGNOSIS — C50911 Malignant neoplasm of unspecified site of right female breast: Secondary | ICD-10-CM

## 2014-06-23 LAB — CBC WITH DIFFERENTIAL/PLATELET
BASO%: 0.3 % (ref 0.0–2.0)
Basophils Absolute: 0 10*3/uL (ref 0.0–0.1)
EOS ABS: 0.2 10*3/uL (ref 0.0–0.5)
EOS%: 5.2 % (ref 0.0–7.0)
HCT: 37 % (ref 34.8–46.6)
HGB: 12.3 g/dL (ref 11.6–15.9)
LYMPH%: 31.4 % (ref 14.0–49.7)
MCH: 29.4 pg (ref 25.1–34.0)
MCHC: 33.2 g/dL (ref 31.5–36.0)
MCV: 88.3 fL (ref 79.5–101.0)
MONO#: 0.4 10*3/uL (ref 0.1–0.9)
MONO%: 11.3 % (ref 0.0–14.0)
NEUT%: 51.8 % (ref 38.4–76.8)
NEUTROS ABS: 1.7 10*3/uL (ref 1.5–6.5)
PLATELETS: 143 10*3/uL — AB (ref 145–400)
RBC: 4.19 10*6/uL (ref 3.70–5.45)
RDW: 13.3 % (ref 11.2–14.5)
WBC: 3.3 10*3/uL — AB (ref 3.9–10.3)
lymph#: 1 10*3/uL (ref 0.9–3.3)

## 2014-06-23 LAB — COMPREHENSIVE METABOLIC PANEL (CC13)
ALK PHOS: 83 U/L (ref 40–150)
ALT: 28 U/L (ref 0–55)
ANION GAP: 12 meq/L — AB (ref 3–11)
AST: 23 U/L (ref 5–34)
Albumin: 4 g/dL (ref 3.5–5.0)
BUN: 11.4 mg/dL (ref 7.0–26.0)
CO2: 23 meq/L (ref 22–29)
CREATININE: 0.8 mg/dL (ref 0.6–1.1)
Calcium: 9.4 mg/dL (ref 8.4–10.4)
Chloride: 107 mEq/L (ref 98–109)
Glucose: 140 mg/dl (ref 70–140)
Potassium: 3.9 mEq/L (ref 3.5–5.1)
SODIUM: 142 meq/L (ref 136–145)
TOTAL PROTEIN: 6.5 g/dL (ref 6.4–8.3)
Total Bilirubin: 0.3 mg/dL (ref 0.20–1.20)

## 2014-06-23 MED ORDER — HEPARIN SOD (PORK) LOCK FLUSH 100 UNIT/ML IV SOLN
500.0000 [IU] | Freq: Once | INTRAVENOUS | Status: AC
  Administered 2014-06-23: 500 [IU] via INTRAVENOUS
  Filled 2014-06-23: qty 5

## 2014-06-23 MED ORDER — SODIUM CHLORIDE 0.9 % IJ SOLN
10.0000 mL | INTRAMUSCULAR | Status: DC | PRN
  Administered 2014-06-23: 10 mL via INTRAVENOUS
  Filled 2014-06-23: qty 10

## 2014-06-23 MED ORDER — PREGABALIN 100 MG PO CAPS: 100.0000 mg | ORAL_CAPSULE | Freq: Three times a day (TID) | ORAL | Status: DC

## 2014-06-23 MED ORDER — BIAFINE EX EMUL
Freq: Once | CUTANEOUS | Status: AC
  Administered 2014-06-23: 16:00:00 via TOPICAL

## 2014-06-23 NOTE — Telephone Encounter (Signed)
gv pt appt schedule for sept. EPIC message to Ivor Costa @ [facility] re port removal. [facility] will contact pt - pt aware.

## 2014-06-23 NOTE — Progress Notes (Signed)
[facility]  [phone] [phone]  OFFICE PROGRESS NOTE   ID: Natalie Arias   DOB: 06-24-1961  MR#: [id number]  QQV#:[id number]   PCP: [name], PA-C SU: Fanny Skates, MD RAD ONC:  Kyung Rudd, MD    CHIEF COMPLAINT: "I finally had the surgery"  STAGE:  Breast cancer of lower-inner quadrant of right female breast   Primary site: Breast (Right)   Staging method: AJCC 7th Edition   Clinical: Stage IIA (T2, N0, cM0)   Pathologic stage IIA (pT2 pN0)     Breast cancer history: From Dr. Laurelyn Sickle intake note 10/06/2013:  "Patient felt a mass in the right breast. Her last normal mammogram was in December 2000 third team. In September 2014 patient felt a mass in the right breast. She is to her primary care physician's attention. She had a mammogram performed that did reveal a suspicious mass at the 4:00 position was irregular and spiculated. On ultrasound measured 3.5 cm irregular with an indistinct margins in the right breast at 4:00 anterior depth. No other significant masses calcifications or other findings were noted in either breast her MRI is pending. Patient had a biopsy performed on 09/30/2013. 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 The tumor was estrogen receptor negative progesterone receptor negative negative with a Ki-67 80%."   CURRENT THERAPY: Radiation  INTERVAL HISTORY: Natalie Arias  returns today accompanied by her husband Natalie Arias for followup of her breast cancer. She is doing very well today.  She is excited because she will complete radiation today.  She is otherwise doing well.  She has some peeling on her right breast and erythema.  She denies fevers, chills, nausea, vomiting, new pain, or any further concerns.    REVIEW OF SYSTEMS: A 10 point review of systems was conducted and is otherwise negative except for what is noted above.     PAST MEDICAL HISTORY: Past  Medical History  Diagnosis Date  . Seasonal allergies   . PONV (postoperative nausea and vomiting)   . Breast cancer dx'd 10/01/2013    right; triplen negative  . Allergy   . Asthma 20's    allergic asthema    PAST SURGICAL HISTORY: Past Surgical History  Procedure Laterality Date  . Laparoscopic endometriosis fulguration  1991  . Portacath placement N/A 10/18/2013    Procedure: INSERTION PORT-A-CATH WITH ULTRASOUND;  Surgeon: Adin Hector, MD;  Location: [facility];  Service: General;  Laterality: N/A;  . Partial mastectomy with needle localization and axillary sentinel lymph node bx Right 04/08/2014    Procedure: PARTIAL MASTECTOMY WITH NEEDLE LOCALIZATION AND AXILLARY SENTINEL LYMPH NODE BIOPSY;  Surgeon: Adin Hector, MD;  Location: [facility];  Service: General;  Laterality: Right;  . Abdominal hysterectomy  1992    1 ovary left    FAMILY HISTORY: Family History  Problem Relation Age of Onset  . Lung cancer Maternal Uncle     smoker  . Breast cancer Paternal Aunt     dx in her late 37s; maternal half sister to patient's father  . Breast cancer Maternal Grandmother 28  . Lung cancer Maternal Uncle     smoker  . Hypertension Father   . Heart disease Father   . Parkinsonism Father   . Skin cancer Father   . Hypercholesterolemia Mother   . Gallstones Mother   . Healthy Brother   . COPD Maternal Grandfather   . Lung cancer  Cousin     smoker; paternal cousin    SOCIAL HISTORY: History  Substance Use Topics  . Smoking status: Never Smoker   . Smokeless tobacco: Never Used  . 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 Her husband Arnette Norris is retired from all tell. Son Leroy Sea lives in [place] where he manages a long Risk manager. Daughter Alyse Low is also in [place], working in Pharmacologist. The patient herself lived mostly in [place]. She has  no brain children. She is a Psychologist, forensic.  Gynecologic history: Menarche age 59, first live birth age 41, the patient is [place] P2. She underwent hysterectomy and unilateral salpingo-oophorectomy in 1990. She took estrogen replacement until 2014.   ALLERGIES: Allergies  Allergen Reactions  . Penicillins Hives    CURRENT MEDICATIONS: Current Outpatient Prescriptions  Medication Sig Dispense Refill  . aspirin 81 MG tablet Take 81 mg by mouth daily.      . B Complex-C (SUPER B COMPLEX PO) Take 1 tablet by mouth daily.       . cholecalciferol (VITAMIN D) 400 UNITS TABS tablet Take 800 Units by mouth daily.       Marland Kitchen emollient (BIAFINE) cream Apply topically 2 (two) times daily.      Marland Kitchen ibuprofen (ADVIL,MOTRIN) 200 MG tablet Take 400 mg by mouth every 6 (six) hours as needed for moderate pain.      . pregabalin (LYRICA) 100 MG capsule Take 1 capsule (100 mg total) by mouth 3 (three) times daily.  90 capsule  3  . SENNA PO Take 2 tablets by mouth daily as needed (For constipation).       Marland Kitchen VITAMIN E PO Take 1 capsule by mouth daily.      . hyaluronate sodium (RADIAPLEXRX) GEL Apply 1 application topically 3 (three) times daily.      . ondansetron (ZOFRAN) 4 MG tablet Take 1 tablet (4 mg total) by mouth every 6 (six) hours.  12 tablet  0  . oxyCODONE-acetaminophen (PERCOCET/ROXICET) 5-325 MG per tablet Take 1-2 tablets by mouth every 6 (six) hours as needed for severe pain.  30 tablet  0   No current facility-administered medications for this visit.    PHYSICAL EXAMINATION: Middle-aged white woman who appears younger than stated age Blood pressure 107/73, pulse 118, temperature 98.3 F (36.8 C), temperature source Oral, resp. rate 18, height '5\' 6"'  (1.676 m), weight 0 lb (0 kg).  ECOG FS: 1 - Symptomatic but completely ambulatory  Sclerae unicteric, pupils equal and reactive Oropharynx clear and moist; teeth in good repair No cervical or supraclavicular adenopathy Lungs no rales or rhonchi Heart  regular rate and rhythm Abd soft, obese, nontender, positive bowel sounds MSK no focal spinal tenderness, no upper extremity lymphedema Neuro: nonfocal, well oriented, appropriate affect Breasts: right breast with erythema and desquamation   LABORATORIES:   Chemistry      Component Value Date/Time   NA 142 06/23/2014 1335   NA 140 10/14/2013 0930   K 3.9 06/23/2014 1335   K 4.7 10/14/2013 0930   CL 105 10/14/2013 0930   CO2 23 06/23/2014 1335   CO2 29 10/14/2013 0930   BUN 11.4 06/23/2014 1335   BUN 12 10/14/2013 0930   CREATININE 0.8 06/23/2014 1335   CREATININE 0.71 10/14/2013 0930      Component Value Date/Time   CALCIUM 9.4 06/23/2014 1335  CALCIUM 9.6 10/14/2013 0930   ALKPHOS 83 06/23/2014 1335   ALKPHOS 70 10/14/2013 0930   AST 23 06/23/2014 1335   AST 16 10/14/2013 0930   ALT 28 06/23/2014 1335   ALT 18 10/14/2013 0930   BILITOT 0.30 06/23/2014 1335   BILITOT 0.3 10/14/2013 0930      Lab Results  Component Value Date   WBC 3.3* 06/23/2014   HGB 12.3 06/23/2014   HCT 37.0 06/23/2014   MCV 88.3 06/23/2014   PLT 143* 06/23/2014    STUDIES/IMAGING: Nm Sentinel Node Inj-no Rpt (breast)  04/08/2014   CLINICAL DATA: Cancer right breast   Sulfur colloid was injected intradermally by the nuclear medicine  technologist for breast cancer sentinel node localization.     ASSESSMENT: ELSYE MCCOLLISTER is a 53 y.o. BRCA negative [facility] woman status post right breast biopsy August 2014 for a clinical T2 N0, stage IIA invasive ductal carcinoma, grade 2, triple negative, with an MIB-1 of 80%.  (1)  s/p neoadjuvant chemotherapy with dose dense doxorubicin/ cyclophosphamide x4 with [name] day 2, completed 12/08/2013, followed by paclitaxel and carboplatin weekly x 12 completed 03/18/2014  (2) status post right lumpectomy and sentinel lymph node sampling for 10/18/2014 for a pT2 pN0, stage IIA invasive ductal carcinoma, grade 3, repeat prognostic panel again triple negative.   (3)  radiation therapy completed on 06/23/14  PLAN:   Mrs. Fuertes is doing very well today.  She is very happy to be completing radiation.  We discussed survivorship today and modifiable risk factors such as exercise, healthy diet, monthly breast exams, and keeping up to date on her mammograms.  She is in agreement.    We will see Mrs. Cavan back in 3 months for close monitoring.  She will see either Dr. Lindi Adie, or Dr. Jana Hakim at that time.   I spent 15 minutes counseling the patient face to face.  The total time spent in the appointment was 30 minutes.  Minette Headland, [place] [phone]

## 2014-06-23 NOTE — Patient Instructions (Signed)

## 2014-06-23 NOTE — Progress Notes (Signed)
Natalie Arias has completed treatment with 33 fractions to her right breast.  She denies pain but does have tenderness in her right breast.  She reports fatigue.  She has areas of peeling skin on the upper portion of her right breast and underneath the right breast.  She is using biafine cream and requested a refill.  Another tube given.

## 2014-06-23 NOTE — Progress Notes (Signed)
  Department of Radiation Oncology  Phone:  [phone] Fax:        [phone]  Weekly Treatment Note    Name: [name] [facility] Date: 06/23/2014 MRN: [id number] DOB: 06/27/1961   Current dose: 60.4 Gy  Current fraction:33   MEDICATIONS: Current Outpatient Prescriptions  Medication Sig Dispense Refill  . aspirin 81 MG tablet Take 81 mg by mouth daily.      . B Complex-C (SUPER B COMPLEX PO) Take 1 tablet by mouth daily.       . cholecalciferol (VITAMIN D) 400 UNITS TABS tablet Take 800 Units by mouth daily.       Marland Kitchen emollient (BIAFINE) cream Apply topically 2 (two) times daily.      Marland Kitchen ibuprofen (ADVIL,MOTRIN) 200 MG tablet Take 400 mg by mouth every 6 (six) hours as needed for moderate pain.      Marland Kitchen ondansetron (ZOFRAN) 4 MG tablet Take 1 tablet (4 mg total) by mouth every 6 (six) hours.  12 tablet  0  . oxyCODONE-acetaminophen (PERCOCET/ROXICET) 5-325 MG per tablet Take 1-2 tablets by mouth every 6 (six) hours as needed for severe pain.  30 tablet  0  . pregabalin (LYRICA) 100 MG capsule Take 1 capsule (100 mg total) by mouth 3 (three) times daily.  90 capsule  3  . SENNA PO Take 2 tablets by mouth daily as needed (For constipation).       Marland Kitchen VITAMIN E PO Take 1 capsule by mouth daily.      . hyaluronate sodium (RADIAPLEXRX) GEL Apply 1 application topically 3 (three) times daily.       No current facility-administered medications for this encounter.     ALLERGIES: Penicillins   LABORATORY DATA:  Lab Results  Component Value Date   WBC 3.3* 06/23/2014   HGB 12.3 06/23/2014   HCT 37.0 06/23/2014   MCV 88.3 06/23/2014   PLT 143* 06/23/2014   Lab Results  Component Value Date   NA 142 06/23/2014   K 3.9 06/23/2014   CL 105 10/14/2013   CO2 23 06/23/2014   Lab Results  Component Value Date   ALT 28 06/23/2014   AST 23 06/23/2014   ALKPHOS 83 06/23/2014   BILITOT 0.30 06/23/2014     NARRATIVE: Natalie Arias was seen today for weekly treatment management. The chart was  checked and the patient's films were reviewed. The patient states that she has done well in her final week of treatment. She has had some tenderness in the right breast but no significant pain. She does report some fatigue.  PHYSICAL EXAMINATION: height is 5\' 6"  (1.676 m). Her oral temperature is 99 F (37.2 C). Her blood pressure is 118/75 and her pulse is 94.      the patient's skin looks good overall. Some hyperpigmentation and some dry desquamation.  ASSESSMENT: The patient did satisfactorily with treatment.  PLAN: The patient will follow-up in our clinic in 1 month.

## 2014-06-30 ENCOUNTER — Telehealth (INDEPENDENT_AMBULATORY_CARE_PROVIDER_SITE_OTHER): Payer: Self-pay

## 2014-06-30 NOTE — Telephone Encounter (Signed)
F/U call patient  States she is ready to have power port removed.Advised her I will inform Dr. Dalbert Batman and Myself or Or scheduling will call her

## 2014-07-04 ENCOUNTER — Other Ambulatory Visit (INDEPENDENT_AMBULATORY_CARE_PROVIDER_SITE_OTHER): Payer: Self-pay | Admitting: General Surgery

## 2014-07-04 NOTE — Addendum Note (Signed)
Encounter addended by: Marye Round, MD on: 07/04/2014  7:20 PM<BR>     Documentation filed: Notes Section, Visit Diagnoses

## 2014-07-04 NOTE — Progress Notes (Signed)
  Radiation Oncology         [phone] ________________________________  Name: KARENA KINKER MRN: [id number]  Date: 06/23/2014  DOB: 03/16/1961  End of Treatment Note  Diagnosis:   Right-sided breast cancer     Indication for treatment:  Curative       Radiation treatment dates:   05/09/2014 through 06/23/2014  Site/dose:   The patient was treated to the right breast using tangent fields. A forward planning technique was used for a total of 4 customized fields.  The patient received treatment in this fashion to a dose of 50.4 gray at 1.8 gray per fraction. Patient then received a boost treatment using a three-field photon technique. In this fashion, the patient received an additional 10 gray in 5 fractions. The patient's total dose was 60.4 gray.   Narrative: The patient tolerated radiation treatment relatively well.   The patient had some skin irritation during treatment. Overall her skin held up well during her course of radiation.   Plan: The patient has completed radiation treatment. The patient will return to radiation oncology clinic for routine followup in one month. I advised the patient to call or return sooner if they have any questions or concerns related to their recovery or treatment. ________________________________  Jodelle Gross, M.D., Ph.D.

## 2014-07-04 NOTE — Addendum Note (Signed)
Encounter addended by: Marye Round, MD on: 07/04/2014  7:17 PM<BR>     Documentation filed: Notes Section

## 2014-07-04 NOTE — Progress Notes (Signed)
Complex simulation note  Diagnosis: Breast cancer  Narrative The patient has initially been planned to receive a course of whole breast radiation to a dose of 50.4 Gy in 28 fractions at 1.8 gray per fraction. The patient will now receive an additional boost to the seroma cavity which has been contoured. This will correspond to a boost of 10 gray at 2 gray per fraction. To accomplish this, an additional 3 customized blocks have been designed for this purpose. A complex isodose plan is requested to ensure that the target area is adequately covered with radiation dose and that the nearby normal structures such as the lung are adequately spared. The patient's final total dose will be 60.4 Gy.  ------------------------------------------------  Natalie Gross, MD, PhD

## 2014-07-04 NOTE — Progress Notes (Signed)
  Radiation Oncology         [phone] ________________________________  Name: Natalie Arias MRN: [id number]  Date: 06/17/2014  DOB: Sep 14, 1961  Simulation Verification Note   NARRATIVE: The patient was brought to the treatment unit and placed in the planned treatment position for the patient's boost treatment. The clinical setup was verified. Then port films were obtained and uploaded to the radiation oncology medical record software.  The treatment beams were carefully compared against the planned radiation fields. The position, location, and shape of the radiation fields was reviewed. The targeted volume of tissue appears to be appropriately covered by the radiation beams. Based on my personal review, I approved the simulation verification. The patient's treatment will proceed as planned.  ________________________________   Jodelle Gross, MD, PhD

## 2014-07-11 ENCOUNTER — Encounter (HOSPITAL_BASED_OUTPATIENT_CLINIC_OR_DEPARTMENT_OTHER): Payer: Self-pay | Admitting: *Deleted

## 2014-07-11 NOTE — Progress Notes (Signed)
No labs needed

## 2014-07-25 NOTE — H&P (Signed)
  History:  This patient returns for a postop appointment.  She was diagnosed with locally advanced cancer in the right breast, upper inner quadrant with a 3.5 cm mass which showed to be a TNBC. This was solitary by MRI. Port-A-Cath was placed and she underwent neoadjuvant chemotherapy.  On 04/08/2014 she underwent right partial mastectomy with bracketed needle localization and right axillary sentinel node biopsy. I left of the Port-A-Cath in intentionally. 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 She is doing well and feels well.  She has completed radiation therapr. She was referred for removal of port.  Exam:  Patient is here with her husband. She is in good spirits and in no distress.  Both breasts are examined. Radially oriented incision right breast, upper inner quadrant looks good. Right axillary incision was good. Breast contour and nipple projection is good. Modest volume loss.  Lungs clear Heart RRR, no murmer  Assessment:  Invasive mammary carcinoma right breast, upper inner quadrant, TNBC, stage ypT2, ypN0 (stage IIA) Status post neoadjuvant chemotherapy following Port-A-Cath placement, partial response  Recovering uneventfully following right partial mastectomy and sentinel node biopsy , and radiation therapy. Asthma endometriosis  Plan:  When it is time to remove the Port-A-Cath, she wants this to be done under sedation  Continue LTFU    Edsel Petrin. Dalbert Batman, M.D., [facility]  General and Minimally invasive Surgery  Breast and Colorectal Surgery  Office: [phone]  Pager: [phone]

## 2014-07-26 ENCOUNTER — Ambulatory Visit (HOSPITAL_BASED_OUTPATIENT_CLINIC_OR_DEPARTMENT_OTHER): Payer: BC Managed Care – PPO | Admitting: Anesthesiology

## 2014-07-26 ENCOUNTER — Ambulatory Visit (HOSPITAL_BASED_OUTPATIENT_CLINIC_OR_DEPARTMENT_OTHER)
Admission: RE | Admit: 2014-07-26 | Discharge: 2014-07-26 | Disposition: A | Discharge status: Home / Self Care | Payer: BC Managed Care – PPO | Source: Ambulatory Visit | Attending: General Surgery | Admitting: General Surgery

## 2014-07-26 ENCOUNTER — Encounter (HOSPITAL_BASED_OUTPATIENT_CLINIC_OR_DEPARTMENT_OTHER)
Admission: RE | Disposition: A | Discharge status: Home / Self Care | Payer: Self-pay | Source: Ambulatory Visit | Attending: General Surgery

## 2014-07-26 ENCOUNTER — Encounter (HOSPITAL_BASED_OUTPATIENT_CLINIC_OR_DEPARTMENT_OTHER): Payer: Self-pay

## 2014-07-26 ENCOUNTER — Encounter (HOSPITAL_BASED_OUTPATIENT_CLINIC_OR_DEPARTMENT_OTHER): Payer: BC Managed Care – PPO | Admitting: Anesthesiology

## 2014-07-26 DIAGNOSIS — Z9010 Acquired absence of unspecified breast and nipple: Secondary | ICD-10-CM | POA: Diagnosis not present

## 2014-07-26 DIAGNOSIS — Z923 Personal history of irradiation: Secondary | ICD-10-CM | POA: Insufficient documentation

## 2014-07-26 DIAGNOSIS — C50311 Malignant neoplasm of lower-inner quadrant of right female breast: Secondary | ICD-10-CM | POA: Diagnosis present

## 2014-07-26 DIAGNOSIS — J45909 Unspecified asthma, uncomplicated: Secondary | ICD-10-CM | POA: Diagnosis not present

## 2014-07-26 DIAGNOSIS — Z6833 Body mass index (BMI) 33.0-33.9, adult: Secondary | ICD-10-CM | POA: Insufficient documentation

## 2014-07-26 DIAGNOSIS — Z452 Encounter for adjustment and management of vascular access device: Secondary | ICD-10-CM | POA: Insufficient documentation

## 2014-07-26 DIAGNOSIS — Z9221 Personal history of antineoplastic chemotherapy: Secondary | ICD-10-CM | POA: Diagnosis not present

## 2014-07-26 DIAGNOSIS — C50219 Malignant neoplasm of upper-inner quadrant of unspecified female breast: Secondary | ICD-10-CM | POA: Insufficient documentation

## 2014-07-26 HISTORY — PX: PORT-A-CATH REMOVAL: SHX5289

## 2014-07-26 LAB — POCT HEMOGLOBIN-HEMACUE: HEMOGLOBIN: 13.5 g/dL (ref 12.0–15.0)

## 2014-07-26 SURGERY — REMOVAL PORT-A-CATH
Anesthesia: Monitor Anesthesia Care | Site: Chest | Laterality: Left

## 2014-07-26 MED ORDER — ONDANSETRON HCL 4 MG/2ML IJ SOLN: 4.0000 mg | Freq: Once | INTRAMUSCULAR | Status: DC | PRN

## 2014-07-26 MED ORDER — PROPOFOL INFUSION 10 MG/ML OPTIME
INTRAVENOUS | Status: DC | PRN
  Administered 2014-07-26: 50 ug/kg/min via INTRAVENOUS

## 2014-07-26 MED ORDER — SODIUM BICARBONATE 4 % IV SOLN
INTRAVENOUS | Status: DC | PRN
  Administered 2014-07-26: 5 mL via INTRAVENOUS

## 2014-07-26 MED ORDER — FENTANYL CITRATE 0.05 MG/ML IJ SOLN
INTRAMUSCULAR | Status: AC
  Filled 2014-07-26: qty 2

## 2014-07-26 MED ORDER — FENTANYL CITRATE 0.05 MG/ML IJ SOLN: 25.0000 ug | INTRAMUSCULAR | Status: DC | PRN

## 2014-07-26 MED ORDER — MIDAZOLAM HCL 5 MG/5ML IJ SOLN
INTRAMUSCULAR | Status: DC | PRN
  Administered 2014-07-26: 2 mg via INTRAVENOUS

## 2014-07-26 MED ORDER — FENTANYL CITRATE 0.05 MG/ML IJ SOLN
INTRAMUSCULAR | Status: DC | PRN
  Administered 2014-07-26: 50 ug via INTRAVENOUS
  Administered 2014-07-26: 25 ug via INTRAVENOUS

## 2014-07-26 MED ORDER — PROPOFOL 10 MG/ML IV BOLUS
INTRAVENOUS | Status: AC
  Filled 2014-07-26: qty 20

## 2014-07-26 MED ORDER — ONDANSETRON HCL 4 MG/2ML IJ SOLN
INTRAMUSCULAR | Status: DC | PRN
  Administered 2014-07-26: 4 mg via INTRAVENOUS

## 2014-07-26 MED ORDER — SODIUM BICARBONATE 4 % IV SOLN
INTRAVENOUS | Status: AC
  Filled 2014-07-26: qty 5

## 2014-07-26 MED ORDER — FENTANYL CITRATE 0.05 MG/ML IJ SOLN: 50.0000 ug | INTRAMUSCULAR | Status: DC | PRN

## 2014-07-26 MED ORDER — CHLORHEXIDINE GLUCONATE 4 % EX LIQD: 1.0000 "application " | Freq: Once | CUTANEOUS | Status: DC

## 2014-07-26 MED ORDER — LIDOCAINE HCL (CARDIAC) 20 MG/ML IV SOLN
INTRAVENOUS | Status: DC | PRN
  Administered 2014-07-26: 50 mg via INTRAVENOUS

## 2014-07-26 MED ORDER — LIDOCAINE-EPINEPHRINE (PF) 1 %-1:200000 IJ SOLN
INTRAMUSCULAR | Status: DC | PRN
  Administered 2014-07-26: 20 mL

## 2014-07-26 MED ORDER — LACTATED RINGERS IV SOLN
INTRAVENOUS | Status: DC
  Administered 2014-07-26: 12:00:00 via INTRAVENOUS

## 2014-07-26 MED ORDER — MIDAZOLAM HCL 2 MG/2ML IJ SOLN: 1.0000 mg | INTRAMUSCULAR | Status: DC | PRN

## 2014-07-26 MED ORDER — MIDAZOLAM HCL 2 MG/2ML IJ SOLN
INTRAMUSCULAR | Status: AC
  Filled 2014-07-26: qty 2

## 2014-07-26 SURGICAL SUPPLY — 37 items
BENZOIN TINCTURE PRP APPL 2/3 (GAUZE/BANDAGES/DRESSINGS) IMPLANT
BLADE HEX COATED 2.75 (ELECTRODE) ×2 IMPLANT
BLADE SURG 15 STRL LF DISP TIS (BLADE) ×2 IMPLANT
BLADE SURG 15 STRL SS (BLADE) ×2
CANISTER SUCT 1200ML W/VALVE (MISCELLANEOUS) IMPLANT
CHLORAPREP W/TINT 26ML (MISCELLANEOUS) ×2 IMPLANT
COVER MAYO STAND STRL (DRAPES) ×2 IMPLANT
COVER TABLE BACK 60X90 (DRAPES) ×2 IMPLANT
DECANTER SPIKE VIAL GLASS SM (MISCELLANEOUS) ×2 IMPLANT
DERMABOND ADVANCED (GAUZE/BANDAGES/DRESSINGS)
DERMABOND ADVANCED .7 DNX12 (GAUZE/BANDAGES/DRESSINGS) IMPLANT
DRAPE PED LAPAROTOMY (DRAPES) ×2 IMPLANT
DRAPE UTILITY XL STRL (DRAPES) ×2 IMPLANT
DRSG TEGADERM 4X4.75 (GAUZE/BANDAGES/DRESSINGS) IMPLANT
ELECT REM PT RETURN 9FT ADLT (ELECTROSURGICAL) ×2
ELECTRODE REM PT RTRN 9FT ADLT (ELECTROSURGICAL) ×1 IMPLANT
GLOVE BIO SURGEON STRL SZ 6.5 (GLOVE) ×2 IMPLANT
GLOVE BIOGEL PI IND STRL 7.0 (GLOVE) ×2 IMPLANT
GLOVE BIOGEL PI INDICATOR 7.0 (GLOVE) ×2
GLOVE EUDERMIC 7 POWDERFREE (GLOVE) ×2 IMPLANT
GOWN STRL REUS W/ TWL LRG LVL3 (GOWN DISPOSABLE) ×1 IMPLANT
GOWN STRL REUS W/ TWL XL LVL3 (GOWN DISPOSABLE) ×1 IMPLANT
GOWN STRL REUS W/TWL LRG LVL3 (GOWN DISPOSABLE) ×1
GOWN STRL REUS W/TWL XL LVL3 (GOWN DISPOSABLE) ×1
NEEDLE HYPO 25X1 1.5 SAFETY (NEEDLE) ×2 IMPLANT
PACK BASIN DAY SURGERY FS (CUSTOM PROCEDURE TRAY) ×2 IMPLANT
PENCIL BUTTON HOLSTER BLD 10FT (ELECTRODE) ×2 IMPLANT
SLEEVE SCD COMPRESS KNEE MED (MISCELLANEOUS) IMPLANT
SPONGE GAUZE 4X4 12PLY STER LF (GAUZE/BANDAGES/DRESSINGS) IMPLANT
STRIP CLOSURE SKIN 1/2X4 (GAUZE/BANDAGES/DRESSINGS) IMPLANT
SUT MNCRL AB 4-0 PS2 18 (SUTURE) ×2 IMPLANT
SUT VICRYL 3-0 CR8 SH (SUTURE) ×2 IMPLANT
SYRINGE 12CC LL (MISCELLANEOUS) ×2 IMPLANT
TOWEL OR 17X24 6PK STRL BLUE (TOWEL DISPOSABLE) ×4 IMPLANT
TOWEL OR NON WOVEN STRL DISP B (DISPOSABLE) ×2 IMPLANT
TUBE CONNECTING 20X1/4 (TUBING) IMPLANT
YANKAUER SUCT BULB TIP NO VENT (SUCTIONS) IMPLANT

## 2014-07-26 NOTE — Op Note (Signed)
Patient Name:           Natalie Arias   Date of Surgery:        07/26/2014  Pre op Diagnosis:  Invasive mammary carcinoma right breast, upper inner quadrant, TNBC, stage ypT2, ypN0 (stage IIA)  Status post neoadjuvant chemotherapy following Port-A-Cath placement, partial response  Desires Port-A-Cath removal       Post op Diagnosis:    Same  Procedure:                 Removal of Port-A-Cath  Surgeon:                     Edsel Petrin. Dalbert Batman, M.D., FACS  Assistant:                      None  Operative Indications:   She was diagnosed with locally advanced cancer in the right breast, upper inner quadrant with a 3.5 cm mass which showed to be a TNBC. This was solitary by MRI. Port-A-Cath was placed and she underwent neoadjuvant chemotherapy.  On 04/08/2014 she underwent right partial mastectomy with bracketed needle localization and right axillary sentinel node biopsy. I left of the Port-A-Cath in intentionally. 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 She is doing well and feels well.  She has completed radiation therapr.  She was referred for removal of port.  Operative Findings:       The port and catheter were removed intact. No bleeding. No evidence of infection.  Procedure in Detail:          The patient was brought to the operating room. She was monitored and sedated by the anesthesia Department. The left upper anterior chest was prepped and draped in a sterile fashion. Surgical time out was performed. 1% xylocaine with epinephrine was used as local infiltration anesthetic. A transverse incision was made in the left infraclavicular area, through the previous scar. Dissection was carried down to the port. The capsule around the port was incised. The port was elevated and all 3 Prolene sutures were removed. The port and catheter were then removed intact. Subcutaneous tissue closed with 3-0 Vicryl sutures and skin  closed with running 4-0 Monocryl suture and Dermabond. The patient tolerated the procedure well was taken to PACU in stable condition. EBL 5 cc. Counts correct. Complications none.     Edsel Petrin. Dalbert Batman, M.D., FACS General and Minimally Invasive Surgery Breast and Colorectal Surgery  07/26/2014 1:00 PM

## 2014-07-26 NOTE — Transfer of Care (Signed)
Immediate Anesthesia Transfer of Care Note  Patient: Natalie Arias  Procedure(Natalie) Performed: Procedure(Natalie): REMOVAL PORT-A-CATH (Left)  Patient Location: PACU  Anesthesia Type:MAC  Level of Consciousness: awake, alert , oriented and patient cooperative  Airway & Oxygen Therapy: Patient Spontanous Breathing  Post-op Assessment: Report given to PACU RN and Post -op Vital signs reviewed and stable  Post vital signs: Reviewed and stable  Complications: No apparent anesthesia complications

## 2014-07-26 NOTE — Interval H&P Note (Signed)
History and Physical Interval Note:  07/26/2014 12:23 PM  Natalie Arias  has presented today for surgery, with the diagnosis of Breast cancer  The various methods of treatment have been discussed with the patient and family. After consideration of risks, benefits and other options for treatment, the patient has consented to  Procedure(Natalie): REMOVAL PORT-A-CATH (N/A) as a surgical intervention .  The patient'Natalie history has been reviewed, patient examined, no change in status, stable for surgery.  I have reviewed the patient'Natalie chart and labs.  Questions were answered to the patient'Natalie satisfaction.     Adin Hector

## 2014-07-26 NOTE — Discharge Instructions (Signed)
Keep ice packs on the wound, intermittently, for 24 hours  you may drive your call her in 2 days or site.  You may shower starting tomorrow.  No sports or dirty strenuous activities for about 3 weeks.  Return to see Dr. Dalbert Batman in October, after you get your annual mammograms.   Post Anesthesia Home Care Instructions  Activity: Get plenty of rest for the remainder of the day. A responsible adult should stay with you for 24 hours following the procedure.  For the next 24 hours, DO NOT: -Drive a car -Paediatric nurse -Drink alcoholic beverages -Take any medication unless instructed by your physician -Make any legal decisions or sign important papers.  Meals: Start with liquid foods such as gelatin or soup. Progress to regular foods as tolerated. Avoid greasy, spicy, heavy foods. If nausea and/or vomiting occur, drink only clear liquids until the nausea and/or vomiting subsides. Call your physician if vomiting continues.  Special Instructions/Symptoms: Your throat may feel dry or sore from the anesthesia or the breathing tube placed in your throat during surgery. If this causes discomfort, gargle with warm salt water. The discomfort should disappear within 24 hours.

## 2014-07-26 NOTE — Anesthesia Preprocedure Evaluation (Signed)
Anesthesia Evaluation  Patient identified by MRN, date of birth, ID band Patient awake    Reviewed: Allergy & Precautions, H&P , NPO status , Patient's Chart, lab work & pertinent test results  History of Anesthesia Complications (+) PONV  Airway Mallampati: I TM Distance: >3 FB Neck ROM: Full    Dental  (+) Teeth Intact, Dental Advisory Given   Pulmonary  breath sounds clear to auscultation        Cardiovascular Rhythm:Regular Rate:Normal     Neuro/Psych    GI/Hepatic   Endo/Other  Morbid obesity  Renal/GU      Musculoskeletal   Abdominal   Peds  Hematology   Anesthesia Other Findings   Reproductive/Obstetrics                           Anesthesia Physical Anesthesia Plan  ASA: III  Anesthesia Plan: MAC   Post-op Pain Management:    Induction: Intravenous  Airway Management Planned: Simple Face Mask  Additional Equipment:   Intra-op Plan:   Post-operative Plan:   Informed Consent: I have reviewed the patients History and Physical, chart, labs and discussed the procedure including the risks, benefits and alternatives for the proposed anesthesia with the patient or authorized representative who has indicated his/her understanding and acceptance.   Dental advisory given  Plan Discussed with: CRNA, Anesthesiologist and Surgeon  Anesthesia Plan Comments:         Anesthesia Quick Evaluation

## 2014-07-26 NOTE — Anesthesia Postprocedure Evaluation (Signed)
  Anesthesia Post-op Note  Patient: Natalie Arias  Procedure(Natalie) Performed: Procedure(Natalie): REMOVAL PORT-A-CATH (Left)  Patient Location: PACU  Anesthesia Type: MAC   Level of Consciousness: awake, alert  and oriented  Airway and Oxygen Therapy: Patient Spontanous Breathing  Post-op Pain: mild  Post-op Assessment: Post-op Vital signs reviewed  Post-op Vital Signs: Reviewed  Last Vitals:  Filed Vitals:   07/26/14 1348  BP: 97/60  Pulse: 77  Temp: 36.9 C  Resp: 16    Complications: No apparent anesthesia complications

## 2014-07-27 ENCOUNTER — Encounter (INDEPENDENT_AMBULATORY_CARE_PROVIDER_SITE_OTHER): Payer: BC Managed Care – PPO | Admitting: General Surgery

## 2014-07-27 ENCOUNTER — Telehealth: Payer: Self-pay | Admitting: Hematology and Oncology

## 2014-07-27 ENCOUNTER — Encounter (HOSPITAL_BASED_OUTPATIENT_CLINIC_OR_DEPARTMENT_OTHER): Payer: Self-pay | Admitting: General Surgery

## 2014-07-27 NOTE — Telephone Encounter (Signed)
, °

## 2014-08-02 ENCOUNTER — Encounter: Payer: Self-pay | Admitting: Radiation Oncology

## 2014-08-03 ENCOUNTER — Ambulatory Visit: Payer: BC Managed Care – PPO | Admitting: Radiation Oncology

## 2014-08-04 ENCOUNTER — Ambulatory Visit
Admission: RE | Admit: 2014-08-04 | Payer: BC Managed Care – PPO | Source: Ambulatory Visit | Admitting: Radiation Oncology

## 2014-08-04 HISTORY — DX: Personal history of irradiation: Z92.3

## 2014-08-11 ENCOUNTER — Ambulatory Visit
Admission: RE | Admit: 2014-08-11 | Payer: BC Managed Care – PPO | Source: Ambulatory Visit | Admitting: Radiation Oncology

## 2014-08-25 ENCOUNTER — Ambulatory Visit
Admission: RE | Admit: 2014-08-25 | Discharge: 2014-08-25 | Disposition: A | Discharge status: Home / Self Care | Payer: BC Managed Care – PPO | Source: Ambulatory Visit | Attending: Radiation Oncology | Admitting: Radiation Oncology

## 2014-08-25 VITALS — BP 110/73 | HR 91 | Temp 99.1°F | Resp 12 | Wt 208.3 lb

## 2014-08-25 DIAGNOSIS — C50311 Malignant neoplasm of lower-inner quadrant of right female breast: Secondary | ICD-10-CM

## 2014-08-25 NOTE — Progress Notes (Signed)
She is currently in no pain. Pt right breast skin is warm dry and intact. Arms and hands are free of edema. Reports she is doing well with no complaints.

## 2014-08-25 NOTE — Progress Notes (Signed)
Radiation Oncology         [phone] ________________________________  Name: Natalie Arias MRN: [id number]  Date: 08/25/2014  DOB: 10/05/1961  Follow-Up Visit Note  CC: HEPLER,MARK, PA-C  Magrinat, Virgie Dad, MD  Diagnosis:   Right -sided breast cancer  Interval Since Last Radiation:  Approximately 2 months  Narrative:  The patient returns today for routine follow-up.  She has done well overall since she finished treatment. The patient's skin has healed significantly since she completed her course of radiation treatment. He has no complaints with regards to this at this time. She has not begun anti-hormonal treatment.  The patient's receptors were negative.                            ALLERGIES:  is allergic to penicillins.  Meds: Current Outpatient Prescriptions  Medication Sig Dispense Refill  . aspirin 81 MG tablet Take 81 mg by mouth daily.      . B Complex-C (SUPER B COMPLEX PO) Take 1 tablet by mouth daily.       . cholecalciferol (VITAMIN D) 400 UNITS TABS tablet Take 800 Units by mouth daily.       . pregabalin (LYRICA) 100 MG capsule Take 1 capsule (100 mg total) by mouth 3 (three) times daily.  90 capsule  3  . VITAMIN E PO Take 1 capsule by mouth daily.      Marland Kitchen emollient (BIAFINE) cream Apply topically 2 (two) times daily.      . hyaluronate sodium (RADIAPLEXRX) GEL Apply 1 application topically 3 (three) times daily.      Marland Kitchen ibuprofen (ADVIL,MOTRIN) 200 MG tablet Take 400 mg by mouth every 6 (six) hours as needed for moderate pain.       No current facility-administered medications for this encounter.    Physical Findings: The patient is in no acute distress. Patient is alert and oriented.  weight is 208 lb 4.8 oz (94.484 kg). Her oral temperature is 99.1 F (37.3 C). Her blood pressure is 110/73 and her pulse is 91. Her respiration is 12 and oxygen saturation is 94%. .     Lab Findings: Lab Results  Component Value Date   WBC 3.3* 06/23/2014   HGB 13.5 07/26/2014   HCT 37.0 06/23/2014   MCV 88.3 06/23/2014   PLT 143* 06/23/2014     Radiographic Findings: No results found.  Impression:    The patient has done satisfactorily since finishing treatment. She has an upcoming appointment in 5 weeks with medical oncology.  Plan:  The patient will followup in our clinic on a when necessary basis.   Jodelle Gross, M.D., Ph.D.

## 2014-09-05 IMAGING — CT CT ABD-PELV W/ CM
2 of 5 series · 17 of 46 positions shown, 19 images · IV contrast (OMNIPAQUE)
Comparison: PET-CT scan 10/21/2013.

CLINICAL DATA: Right breast cancer. Stage II triple negative
histology

EXAM:
CT CHEST, ABDOMEN, AND PELVIS WITH CONTRAST
TECHNIQUE: Multidetector CT imaging of the chest, abdomen and pelvis was
performed following the standard protocol during bolus
administration of intravenous contrast.
CONTRAST:  100mL OMNIPAQUE IOHEXOL 300 MG/ML  SOLN

[Series 2: cap with st · axial · 0.78mm/px · z∈[-603,-28]mm · 14 of 131 slices shown, 16 images]
[im 8/131  soft-tissue]
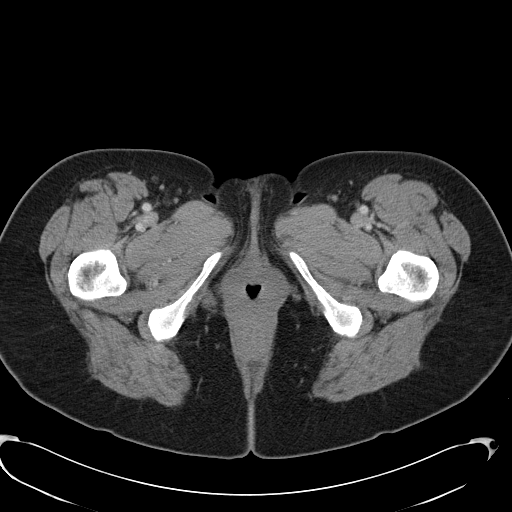
[im 8/131  bone]
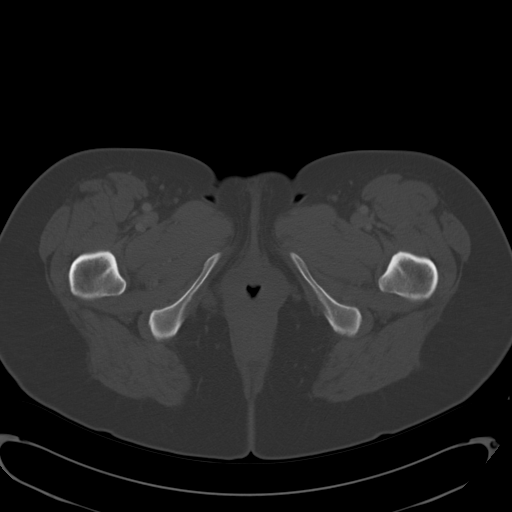
[im 16/131  soft-tissue]
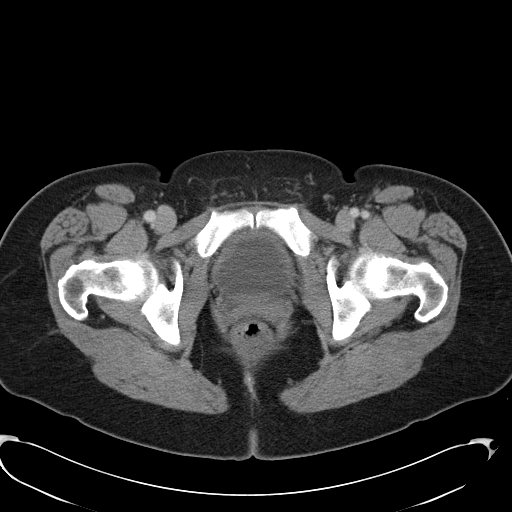
[im 23/131  soft-tissue]
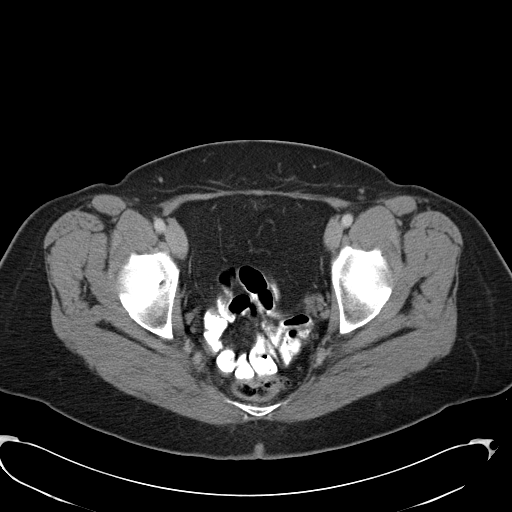
[im 39/131  soft-tissue]
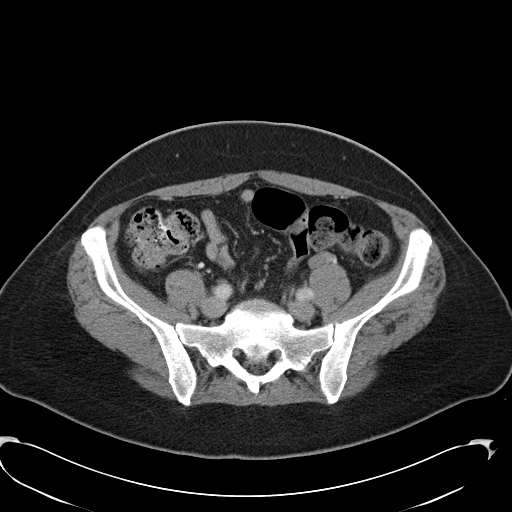
[im 46/131  soft-tissue]
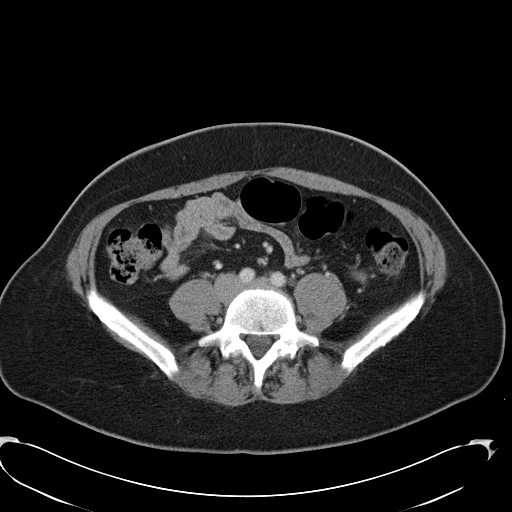
[im 54/131  soft-tissue]
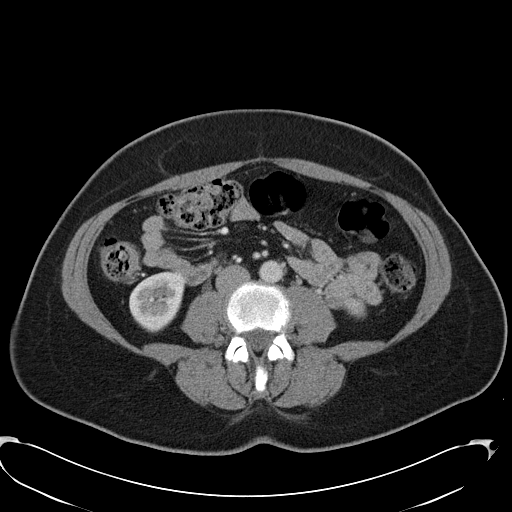
[im 62/131  soft-tissue]
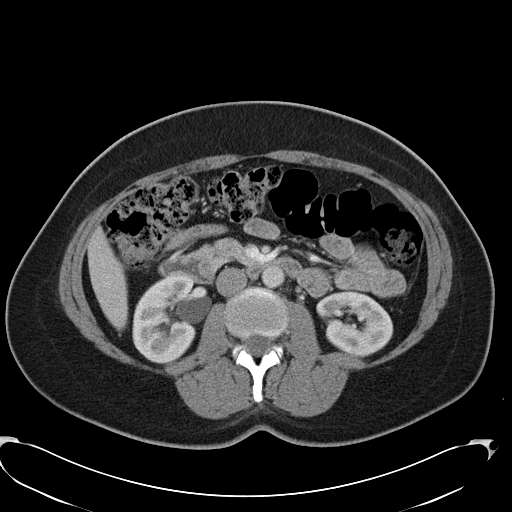
[im 69/131  soft-tissue]
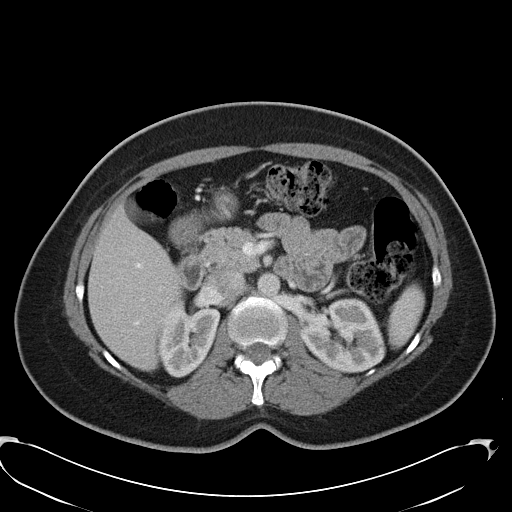
[im 77/131  soft-tissue]
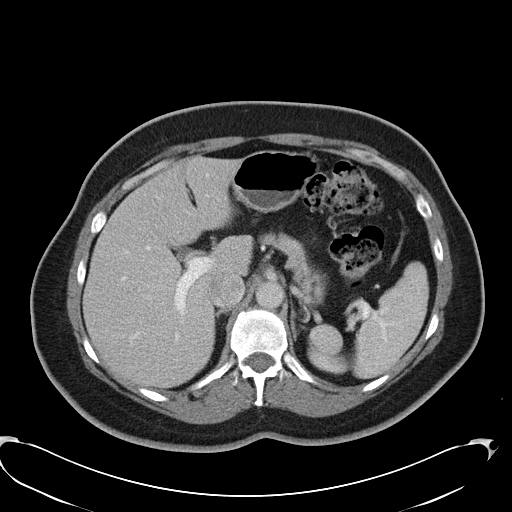
[im 77/131  bone]
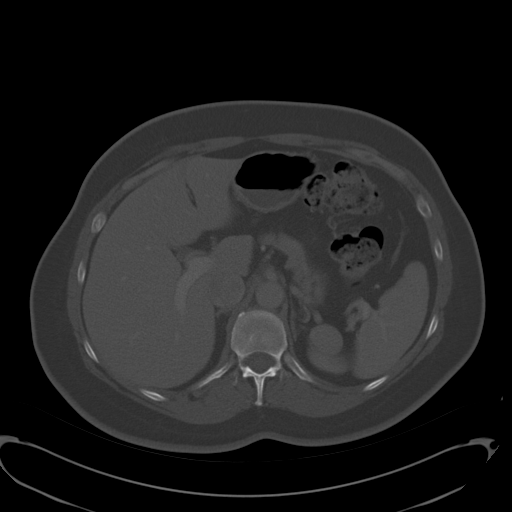
[im 85/131  soft-tissue]
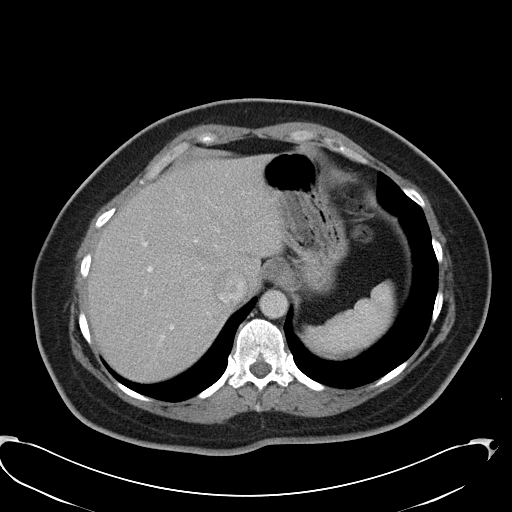
[im 100/131  soft-tissue]
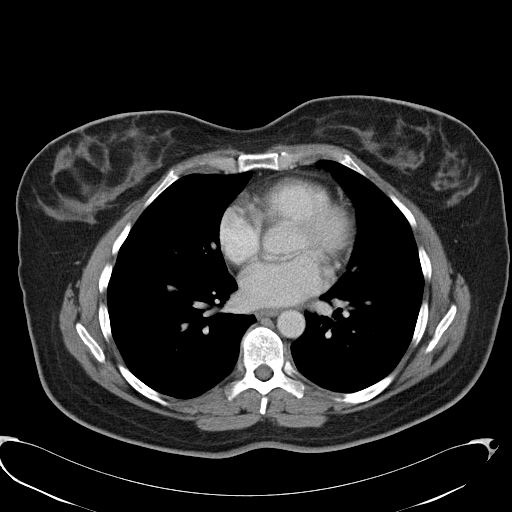
[im 108/131  soft-tissue]
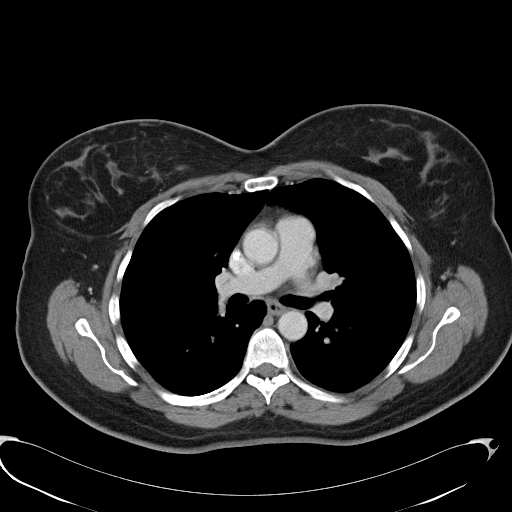
[im 115/131  soft-tissue]
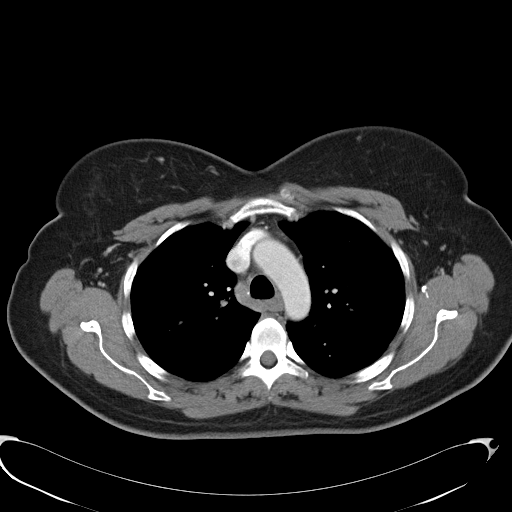
[im 123/131  soft-tissue]
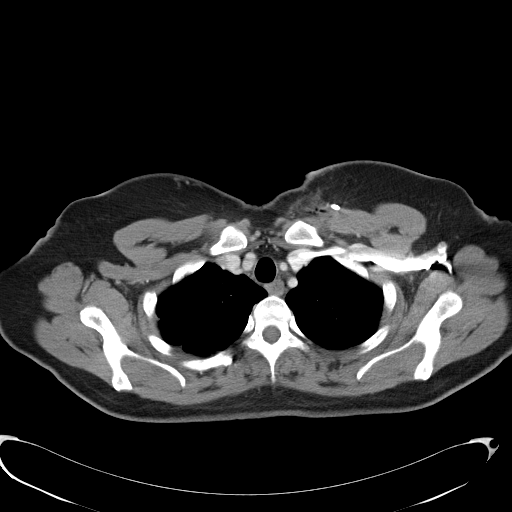

[Series 602: <mpr thick range> · coronal · 1.27mm/px · 3 of 94 slices shown]
[im 32/94  soft-tissue]
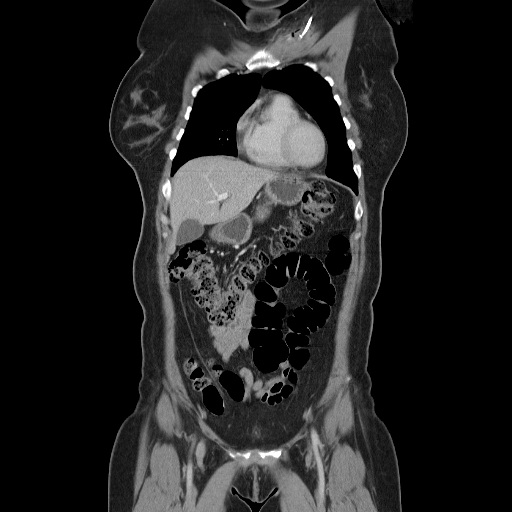
[im 42/94  soft-tissue]
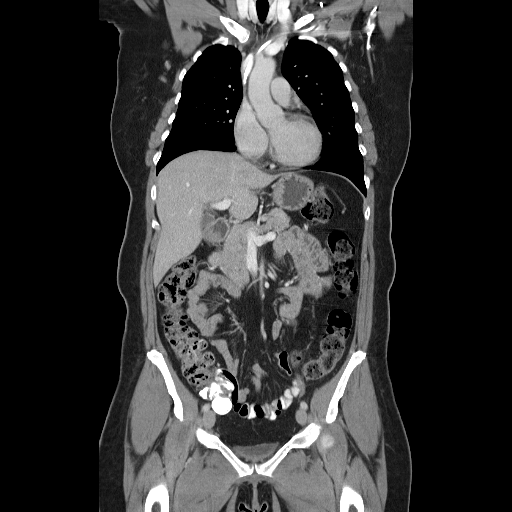
[im 52/94  soft-tissue]
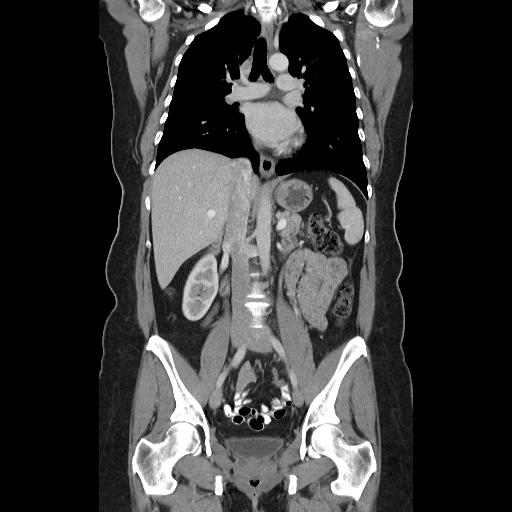

[17 of 46 positions shown; findings below may reference images not displayed]

FINDINGS: CT CHEST FINDINGS

Within the medial right breast, there is a 2.7 x 3.0 cm enhancing
mass. There are no enlarged right axillary lymph nodes. Port in the
left chest wall.

There are no enlarged supraclavicular or infraclavicular lymph
nodes. No internal mammary lymph nodes identified. No mediastinal or
hilar lymph nodes.

No pericardial fluid. Esophagus is normal.

Review of the lung parenchyma demonstrates no suspicious pulmonary
nodules.

CT ABDOMEN AND PELVIS FINDINGS

No focal hepatic lesion. The gallbladder, pancreas, spleen, adrenal
glands, kidneys are normal.

Stomach, small bowel, and colon are unremarkable.

Abdominal aorta is normal in caliber. No retroperitoneal or
periportal lymphadenopathy. No peritoneal disease. Post hysterectomy
anatomy. No abnormality of the adnexa. No pelvic lymphadenopathy.

Review of bone windows demonstrates no aggressive osseous lesions.
IMPRESSION: 1. Right breast mass consistent known history of carcinoma.

2. No evidence of local nodal metastasis.

3. No evidence of distant metastasis.

## 2014-09-05 IMAGING — PT NM PET TUM IMG INITIAL (PI) SKULL BASE T - THIGH
1 of 3 series · 2 of 25 positions shown · non-contrast
Comparison: Concurrent CT chest abdomen pelvis dated 10/21/2013.

CLINICAL DATA: Initial treatment strategy for right breast cancer.

EXAM:
NUCLEAR MEDICINE PET SKULL BASE TO THIGH
FASTING BLOOD GLUCOSE:  Value:  90 mg/dl
TECHNIQUE: 16.7 mCi F-18 FDG was injected intravenously. CT data was obtained
and used for attenuation correction and anatomic localization only.
(This was not acquired as a diagnostic CT examination.) Additional
exam technical data entered on technologist worksheet.

[Series 2: ct images · axial · 3.8mm · 0.98mm/px · z∈[-105,+0]mm · 2 of 267 slices shown]
[im 235/267  brain]
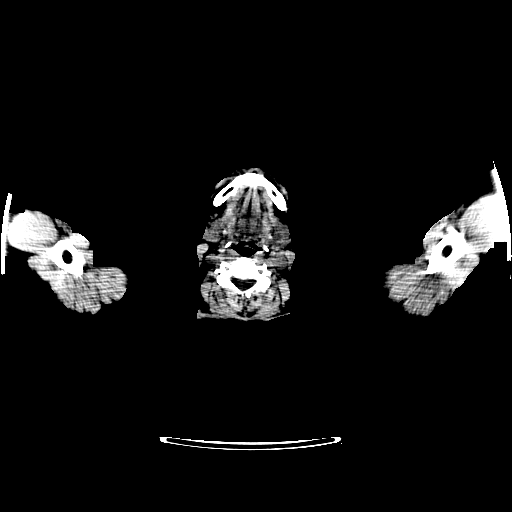
[im 267/267  brain]
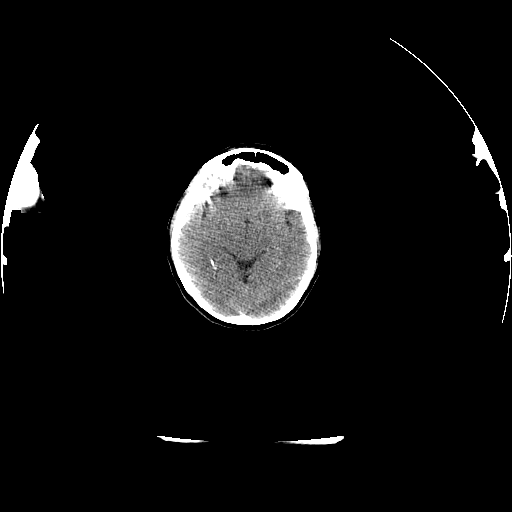

[2 of 25 positions shown; findings below may reference images not displayed]

FINDINGS: NECK

No hypermetabolic lymph nodes in the neck.

CHEST

[DATE] x 3.5 cm inner right breast mass (series 2/ image 88), max SUV
16.0, corresponding to known primary breast neoplasm.

No hypermetabolic mediastinal or hilar nodes.

No suspicious pulmonary nodules on the CT scan.

ABDOMEN/PELVIS

No abnormal hypermetabolic activity within the liver, pancreas,
adrenal glands, or spleen.

No hypermetabolic lymph nodes in the abdomen or pelvis.

Apparent mild gastric wall thickening in the area of the gastric
cardia/GE junction (series 2/ image 117) with max SUV 8.7 (PET image
114). This appearance is not suspicious on concurrent CT and may
reflect physiologic GI uptake versus gastritis.

SKELETON

No focal hypermetabolic activity to suggest skeletal metastasis.
IMPRESSION: 3.5 cm inner right breast mass, max SUV 16.0, corresponding to known
primary breast neoplasm.

No findings specific for metastatic disease.

Apparent mild wall thickening with hypermetabolism in the area of
the gastric cardia/GE junction, max SUV 8.7, favored to reflect
physiologic GI uptake versus gastritis. Consider endoscopic
correlation as clinically warranted.

## 2014-09-22 ENCOUNTER — Other Ambulatory Visit: Payer: BC Managed Care – PPO

## 2014-09-22 ENCOUNTER — Ambulatory Visit: Payer: BC Managed Care – PPO | Admitting: Hematology and Oncology

## 2014-09-23 ENCOUNTER — Telehealth: Payer: Self-pay | Admitting: Hematology and Oncology

## 2014-09-23 NOTE — Telephone Encounter (Signed)
, °

## 2014-09-29 ENCOUNTER — Other Ambulatory Visit: Payer: BC Managed Care – PPO

## 2014-09-29 ENCOUNTER — Ambulatory Visit: Payer: BC Managed Care – PPO | Admitting: Hematology and Oncology

## 2014-10-03 ENCOUNTER — Telehealth: Payer: Self-pay | Admitting: Hematology and Oncology

## 2014-10-03 ENCOUNTER — Ambulatory Visit (HOSPITAL_BASED_OUTPATIENT_CLINIC_OR_DEPARTMENT_OTHER): Payer: BC Managed Care – PPO | Admitting: Hematology and Oncology

## 2014-10-03 ENCOUNTER — Other Ambulatory Visit (HOSPITAL_BASED_OUTPATIENT_CLINIC_OR_DEPARTMENT_OTHER): Payer: BC Managed Care – PPO

## 2014-10-03 VITALS — BP 121/67 | HR 80 | Temp 98.3°F | Resp 18 | Ht 66.0 in | Wt 201.1 lb

## 2014-10-03 DIAGNOSIS — Z853 Personal history of malignant neoplasm of breast: Secondary | ICD-10-CM

## 2014-10-03 DIAGNOSIS — C50311 Malignant neoplasm of lower-inner quadrant of right female breast: Secondary | ICD-10-CM

## 2014-10-03 LAB — CBC WITH DIFFERENTIAL/PLATELET
BASO%: 0.9 % (ref 0.0–2.0)
BASOS ABS: 0 10*3/uL (ref 0.0–0.1)
EOS%: 4.2 % (ref 0.0–7.0)
Eosinophils Absolute: 0.1 10*3/uL (ref 0.0–0.5)
HCT: 38.7 % (ref 34.8–46.6)
HEMOGLOBIN: 12.8 g/dL (ref 11.6–15.9)
LYMPH#: 1.2 10*3/uL (ref 0.9–3.3)
LYMPH%: 34.2 % (ref 14.0–49.7)
MCH: 29.3 pg (ref 25.1–34.0)
MCHC: 33 g/dL (ref 31.5–36.0)
MCV: 88.7 fL (ref 79.5–101.0)
MONO#: 0.4 10*3/uL (ref 0.1–0.9)
MONO%: 11.5 % (ref 0.0–14.0)
NEUT#: 1.7 10*3/uL (ref 1.5–6.5)
NEUT%: 49.2 % (ref 38.4–76.8)
Platelets: 145 10*3/uL (ref 145–400)
RBC: 4.36 10*6/uL (ref 3.70–5.45)
RDW: 14.2 % (ref 11.2–14.5)
WBC: 3.5 10*3/uL — ABNORMAL LOW (ref 3.9–10.3)

## 2014-10-03 LAB — COMPREHENSIVE METABOLIC PANEL (CC13)
ALBUMIN: 3.9 g/dL (ref 3.5–5.0)
ALT: 24 U/L (ref 0–55)
AST: 16 U/L (ref 5–34)
Alkaline Phosphatase: 104 U/L (ref 40–150)
Anion Gap: 7 mEq/L (ref 3–11)
BUN: 13.5 mg/dL (ref 7.0–26.0)
CALCIUM: 10 mg/dL (ref 8.4–10.4)
CHLORIDE: 110 meq/L — AB (ref 98–109)
CO2: 28 mEq/L (ref 22–29)
Creatinine: 0.7 mg/dL (ref 0.6–1.1)
Glucose: 92 mg/dl (ref 70–140)
POTASSIUM: 4.4 meq/L (ref 3.5–5.1)
Sodium: 145 mEq/L (ref 136–145)
TOTAL PROTEIN: 6.6 g/dL (ref 6.4–8.3)
Total Bilirubin: 0.38 mg/dL (ref 0.20–1.20)

## 2014-10-03 NOTE — Assessment & Plan Note (Signed)
Natalie Arias is a 53 y.o. BRCA negative Natalie Arias woman status post right breast biopsy August 2014 for a clinical T2 N0, stage IIA invasive ductal carcinoma, grade 2, triple negative, with an MIB-1 of 80%.  (1) s/p neoadjuvant chemotherapy with dose dense doxorubicin/ cyclophosphamide x4 with [name] day 2, completed 12/08/2013, followed by paclitaxel and carboplatin weekly x 12 completed 03/18/2014  (2) status post right lumpectomy and sentinel lymph node sampling for 10/18/2014 for a pT2 pN0, stage IIA invasive ductal carcinoma, grade 3, repeat prognostic panel again triple negative.  (3) radiation therapy completed on 06/23/14  Plan: 1. surveillance: Breast exam done today does not reveal any major abnormalities the right breast are is well healed with some skin thickening and nodularity associated with prior radiation therapy. I recommended that she make an appointment for a diagnostic mammogram to be done at [facility] in the next month. 2. return to clinic in 6 months for routine followup physical exam and blood work. 3. Discussed the importance of physical exercise in decreasing the likelihood of breast cancer recurrence. Recommended 30 mins daily 6 days a week of either brisk walking or cycling or swimming. Encouraged patient to eat more fruits and vegetables and decrease red meat.

## 2014-10-03 NOTE — Progress Notes (Signed)
Patient Care Team: Mark Hepler, PA-C as PCP - General (Physician Assistant)  DIAGNOSIS: Breast cancer of lower-inner quadrant of right female breast   Primary site: Breast (Right)   Staging method: AJCC 7th Edition   Clinical: Stage IIA (T2, N0, cM0)   Summary: Stage IIA (T2, N0, cM0)   Clinical comments: Staged at breast conference 10.8.14  CHIEF COMPLIANT: Followup of breast cancer  INTERVAL HISTORY: Natalie Arias is a 53-year-old Caucasian with above-mentioned history of right-sided breast cancer treated with lumpectom after neoadjuvant chemotherapy followed by radiation therapy. Because she is ER/PR HER-2 negative, she did not need any anti-estrogen therapy. She is feeling well today without any major problems concerns. She still feels tired towards the end of the long work day. In slightly tender in the breast area from prior surgery. 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  REVIEW OF SYSTEMS:   Constitutional: Denies fevers, chills or abnormal weight loss Eyes: Denies blurriness of vision Ears, nose, mouth, throat, and face: Denies mucositis or sore throat Respiratory: Denies cough, dyspnea or wheezes Cardiovascular: Denies palpitation, chest discomfort or lower extremity swelling Gastrointestinal:  Denies nausea, heartburn or change in bowel habits Skin: Denies abnormal skin rashes Lymphatics: Denies new lymphadenopathy or easy bruising Neurological:Denies numbness, tingling or new weaknesses Behavioral/Psych: Mood is stable, no new changes  Breast:  denies any pain or lumps or nodules in either breasts All other systems were reviewed with the patient and are negative.  I have reviewed the past medical history, past surgical history, social history and family history with the patient and they are unchanged from previous note.  ALLERGIES:  is allergic to penicillins.  MEDICATIONS:  Current Outpatient Prescriptions   Medication Sig Dispense Refill  . aspirin 81 MG tablet Take 81 mg by mouth daily.      . B Complex-C (SUPER B COMPLEX PO) Take 1 tablet by mouth daily.       . cholecalciferol (VITAMIN D) 400 UNITS TABS tablet Take 800 Units by mouth daily.       . ibuprofen (ADVIL,MOTRIN) 200 MG tablet Take 400 mg by mouth every 6 (six) hours as needed for moderate pain.      . pregabalin (LYRICA) 100 MG capsule Take 1 capsule (100 mg total) by mouth 3 (three) times daily.  90 capsule  3  . VITAMIN E PO Take 1 capsule by mouth daily.       No current facility-administered medications for this visit.    PHYSICAL EXAMINATION: ECOG PERFORMANCE STATUS: 0 - Asymptomatic  Filed Vitals:   10/03/14 0903  BP: 121/67  Pulse: 80  Temp: 98.3 F (36.8 C)  Resp: 18   Filed Weights   10/03/14 0903  Weight: 201 lb 1.6 oz (91.218 kg)    GENERAL:alert, no distress and comfortable SKIN: skin color, texture, turgor are normal, no rashes or significant lesions EYES: normal, Conjunctiva are pink and non-injected, sclera clear OROPHARYNX:no exudate, no erythema and lips, buccal mucosa, and tongue normal  NECK: supple, thyroid normal size, non-tender, without nodularity LYMPH:  no palpable lymphadenopathy in the cervical, axillary or inguinal LUNGS: clear to auscultation and percussion with normal breathing effort HEART: regular rate & rhythm and no murmurs and no lower extremity edema ABDOMEN:abdomen soft, non-tender and normal bowel sounds Musculoskeletal:no cyanosis of digits and no clubbing  NEURO: alert & oriented x 3 with fluent speech, no focal motor/sensory deficits BREAST: No palpable masses or nodules   in either right or left breasts. No palpable axillary supraclavicular or infraclavicular adenopathy no breast tenderness or nipple discharge.   LABORATORY DATA:  I have reviewed the data as listed   Chemistry      Component Value Date/Time   NA 142 06/23/2014 1335   NA 140 10/14/2013 0930   K 3.9  06/23/2014 1335   K 4.7 10/14/2013 0930   CL 105 10/14/2013 0930   CO2 23 06/23/2014 1335   CO2 29 10/14/2013 0930   BUN 11.4 06/23/2014 1335   BUN 12 10/14/2013 0930   CREATININE 0.8 06/23/2014 1335   CREATININE 0.71 10/14/2013 0930      Component Value Date/Time   CALCIUM 9.4 06/23/2014 1335   CALCIUM 9.6 10/14/2013 0930   ALKPHOS 83 06/23/2014 1335   ALKPHOS 70 10/14/2013 0930   AST 23 06/23/2014 1335   AST 16 10/14/2013 0930   ALT 28 06/23/2014 1335   ALT 18 10/14/2013 0930   BILITOT 0.30 06/23/2014 1335   BILITOT 0.3 10/14/2013 0930       Lab Results  Component Value Date   WBC 3.5* 10/03/2014   HGB 12.8 10/03/2014   HCT 38.7 10/03/2014   MCV 88.7 10/03/2014   PLT 145 10/03/2014   NEUTROABS 1.7 10/03/2014     RADIOGRAPHIC STUDIES: I have personally reviewed the radiology reports and agreed with their findings. No results found.   ASSESSMENT & PLAN:  Breast cancer of lower-inner quadrant of right female breast Natalie Arias is a 53 y.o. BRCA negative [place] woman status post right breast biopsy August 2014 for a clinical T2 N0, stage IIA invasive ductal carcinoma, grade 2, triple negative, with an MIB-1 of 80%.  (1) Natalie/p neoadjuvant chemotherapy with dose dense doxorubicin/ cyclophosphamide x4 with [name] day 2, completed 12/08/2013, followed by paclitaxel and carboplatin weekly x 12 completed 03/18/2014  (2) status post right lumpectomy and sentinel lymph node sampling for 10/18/2014 for a pT2 pN0, stage IIA invasive ductal carcinoma, grade 3, repeat prognostic panel again triple negative.  (3) radiation therapy completed on 06/23/14  Plan: 1. surveillance: Breast exam done today does not reveal any major abnormalities the right breast are is well healed with some skin thickening and nodularity associated with prior radiation therapy. I recommended that she make an appointment for a diagnostic mammogram to be done at [facility] in the next month. 2. return to clinic in 6 months  for routine followup physical exam and blood work. 3. Discussed the importance of physical exercise in decreasing the likelihood of breast cancer recurrence. Recommended 30 mins daily 6 days a week of either brisk walking or cycling or swimming. Encouraged patient to eat more fruits and vegetables and decrease red meat.     No orders of the defined types were placed in this encounter.   The patient has a good understanding of the overall plan. she agrees with it. She will call with any problems that may develop before her next visit here.  I spent 20 minutes counseling the patient face to face. The total time spent in the appointment was 25 minutes and more than 50% was on counseling and review of test results    Gudena, Vinay K, MD 10/03/2014 9:30 AM   

## 2014-10-03 NOTE — Telephone Encounter (Signed)
, °

## 2014-10-27 ENCOUNTER — Ambulatory Visit (INDEPENDENT_AMBULATORY_CARE_PROVIDER_SITE_OTHER): Payer: BC Managed Care – PPO | Admitting: General Surgery

## 2014-10-28 ENCOUNTER — Other Ambulatory Visit: Payer: Self-pay | Admitting: Adult Health

## 2014-10-28 ENCOUNTER — Encounter: Payer: Self-pay | Admitting: Adult Health

## 2014-10-28 ENCOUNTER — Other Ambulatory Visit: Payer: Self-pay | Admitting: *Deleted

## 2014-10-28 DIAGNOSIS — C50311 Malignant neoplasm of lower-inner quadrant of right female breast: Secondary | ICD-10-CM

## 2014-10-28 MED ORDER — PREGABALIN 100 MG PO CAPS
ORAL_CAPSULE | ORAL | Status: DC
Start: 2014-10-28 — End: 2015-06-14

## 2015-04-03 ENCOUNTER — Other Ambulatory Visit: Payer: Self-pay

## 2015-04-03 DIAGNOSIS — C50311 Malignant neoplasm of lower-inner quadrant of right female breast: Secondary | ICD-10-CM

## 2015-04-04 ENCOUNTER — Telehealth: Payer: Self-pay | Admitting: Hematology and Oncology

## 2015-04-04 ENCOUNTER — Ambulatory Visit (HOSPITAL_BASED_OUTPATIENT_CLINIC_OR_DEPARTMENT_OTHER): Payer: BLUE CROSS/BLUE SHIELD | Admitting: Hematology and Oncology

## 2015-04-04 ENCOUNTER — Other Ambulatory Visit (HOSPITAL_BASED_OUTPATIENT_CLINIC_OR_DEPARTMENT_OTHER): Payer: BLUE CROSS/BLUE SHIELD

## 2015-04-04 DIAGNOSIS — Z853 Personal history of malignant neoplasm of breast: Secondary | ICD-10-CM

## 2015-04-04 DIAGNOSIS — C50311 Malignant neoplasm of lower-inner quadrant of right female breast: Secondary | ICD-10-CM

## 2015-04-04 LAB — CBC WITH DIFFERENTIAL/PLATELET
BASO%: 0.2 % (ref 0.0–2.0)
BASOS ABS: 0 10*3/uL (ref 0.0–0.1)
EOS%: 3.4 % (ref 0.0–7.0)
Eosinophils Absolute: 0.2 10*3/uL (ref 0.0–0.5)
HCT: 39.4 % (ref 34.8–46.6)
HGB: 13.4 g/dL (ref 11.6–15.9)
LYMPH%: 36.9 % (ref 14.0–49.7)
MCH: 30.6 pg (ref 25.1–34.0)
MCHC: 34 g/dL (ref 31.5–36.0)
MCV: 90 fL (ref 79.5–101.0)
MONO#: 0.5 10*3/uL (ref 0.1–0.9)
MONO%: 10 % (ref 0.0–14.0)
NEUT#: 2.3 10*3/uL (ref 1.5–6.5)
NEUT%: 49.5 % (ref 38.4–76.8)
Platelets: 150 10*3/uL (ref 145–400)
RBC: 4.38 10*6/uL (ref 3.70–5.45)
RDW: 13.8 % (ref 11.2–14.5)
WBC: 4.7 10*3/uL (ref 3.9–10.3)
lymph#: 1.7 10*3/uL (ref 0.9–3.3)

## 2015-04-04 LAB — COMPREHENSIVE METABOLIC PANEL (CC13)
ALBUMIN: 4 g/dL (ref 3.5–5.0)
ALK PHOS: 106 U/L (ref 40–150)
ALT: 24 U/L (ref 0–55)
AST: 21 U/L (ref 5–34)
Anion Gap: 9 mEq/L (ref 3–11)
BUN: 12.5 mg/dL (ref 7.0–26.0)
CALCIUM: 9 mg/dL (ref 8.4–10.4)
CO2: 28 mEq/L (ref 22–29)
Chloride: 105 mEq/L (ref 98–109)
Creatinine: 0.8 mg/dL (ref 0.6–1.1)
EGFR: 82 mL/min/{1.73_m2} — ABNORMAL LOW (ref >90)
Glucose: 101 mg/dl (ref 70–140)
POTASSIUM: 4.2 meq/L (ref 3.5–5.1)
SODIUM: 143 meq/L (ref 136–145)
Total Bilirubin: 0.46 mg/dL (ref 0.20–1.20)
Total Protein: 6.5 g/dL (ref 6.4–8.3)

## 2015-04-04 NOTE — Progress Notes (Signed)
Patient Care Team: [name], PA-C as PCP - General (Physician Assistant)  DIAGNOSIS: Breast cancer of lower-inner quadrant of right female breast   Staging form: Breast, AJCC 7th Edition     Clinical: Stage IIA (T2, N0, cM0) - Unsigned       Staging comments: Staged at breast conference 10.8.14      Pathologic: No stage assigned - Unsigned   SUMMARY OF ONCOLOGIC HISTORY:   Breast cancer of lower-inner quadrant of right female breast   08/03/2013 - 03/18/2014 Neo-Adjuvant Chemotherapy Adriamycin and Cytoxan 4 followed by Taxol and carboplatin weekly 12   04/08/2014 Surgery Right breast lumpectomy T2 N0 M0 stage II a grade 3 triple negative invasive ductal carcinoma   05/03/2014 - 06/23/2014 Radiation Therapy Adjuvant radiation therapy    CHIEF COMPLIANT: Follow-up of breast cancer on surveillance  INTERVAL HISTORY: Natalie Arias is a 54 year old lady with above-mentioned history of right breast cancer treated with neoadjuvant chemotherapy and surgery and is currently on observation. She has no new complaints or concerns. She does not have any breast lumps or nodules. Mammogram in October which was normal. 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  REVIEW OF SYSTEMS:   Constitutional: Denies fevers, chills or abnormal weight loss Eyes: Denies blurriness of vision Ears, nose, mouth, throat, and face: Denies mucositis or sore throat Respiratory: Denies cough, dyspnea or wheezes Cardiovascular: Denies palpitation, chest discomfort or lower extremity swelling Gastrointestinal:  Denies nausea, heartburn or change in bowel habits Skin: Denies abnormal skin rashes Lymphatics: Denies new lymphadenopathy or easy bruising Neurological:Denies numbness, tingling or new weaknesses Behavioral/Psych: Mood is stable, no new changes  Breast:  denies any pain or lumps or nodules in either breasts All other systems were reviewed with the  patient and are negative.  I have reviewed the past medical history, past surgical history, social history and family history with the patient and they are unchanged from previous note.  ALLERGIES:  is allergic to penicillins.  MEDICATIONS:  Current Outpatient Prescriptions  Medication Sig Dispense Refill  . albuterol (PROVENTIL HFA;VENTOLIN HFA) 108 (90 BASE) MCG/ACT inhaler Inhale into the lungs every 6 (six) hours as needed for wheezing or shortness of breath.    Marland Kitchen aspirin 81 MG tablet Take 81 mg by mouth daily.    . B Complex-C (SUPER B COMPLEX PO) Take 1 tablet by mouth daily.     . cholecalciferol (VITAMIN D) 400 UNITS TABS tablet Take 800 Units by mouth daily.     . Hydrocodone-Guaifenesin 2.5-200 MG/5ML SOLN Take by mouth.    Marland Kitchen ibuprofen (ADVIL,MOTRIN) 200 MG tablet Take 400 mg by mouth every 6 (six) hours as needed for moderate pain.    . pregabalin (LYRICA) 100 MG capsule TAKE ONE CAPSULE BY MOUTH THREE TIMES DAILY (Patient taking differently: 2 (two) times daily. TAKE ONE CAPSULE BY MOUTH THREE TIMES DAILY) 90 capsule 5  . VITAMIN E PO Take 1 capsule by mouth daily.     No current facility-administered medications for this visit.    PHYSICAL EXAMINATION: ECOG PERFORMANCE STATUS: 0 - Asymptomatic  Filed Vitals:   04/04/15 1501  BP: 112/65  Pulse: 86  Temp: 98.3 F (36.8 C)  Resp: 18   Filed Weights   04/04/15 1501  Weight: 197 lb (89.359 kg)    GENERAL:alert, no distress and comfortable SKIN: skin color, texture, turgor are normal, no rashes or significant lesions EYES: normal,  Conjunctiva are pink and non-injected, sclera clear OROPHARYNX:no exudate, no erythema and lips, buccal mucosa, and tongue normal  NECK: supple, thyroid normal size, non-tender, without nodularity LYMPH:  no palpable lymphadenopathy in the cervical, axillary or inguinal LUNGS: clear to auscultation and percussion with normal breathing effort HEART: regular rate & rhythm and no murmurs and  no lower extremity edema ABDOMEN:abdomen soft, non-tender and normal bowel sounds Musculoskeletal:no cyanosis of digits and no clubbing  NEURO: alert & oriented x 3 with fluent speech, no focal motor/sensory deficits BREAST: No palpable masses or nodules in either right or left breasts. No palpable axillary supraclavicular or infraclavicular adenopathy no breast tenderness or nipple discharge. (exam performed in the presence of a chaperone)  LABORATORY DATA:  I have reviewed the data as listed   Chemistry      Component Value Date/Time   NA 143 04/04/2015 1443   NA 140 10/14/2013 0930   K 4.2 04/04/2015 1443   K 4.7 10/14/2013 0930   CL 105 10/14/2013 0930   CO2 28 04/04/2015 1443   CO2 29 10/14/2013 0930   BUN 12.5 04/04/2015 1443   BUN 12 10/14/2013 0930   CREATININE 0.8 04/04/2015 1443   CREATININE 0.71 10/14/2013 0930      Component Value Date/Time   CALCIUM 9.0 04/04/2015 1443   CALCIUM 9.6 10/14/2013 0930   ALKPHOS 106 04/04/2015 1443   ALKPHOS 70 10/14/2013 0930   AST 21 04/04/2015 1443   AST 16 10/14/2013 0930   ALT 24 04/04/2015 1443   ALT 18 10/14/2013 0930   BILITOT 0.46 04/04/2015 1443   BILITOT 0.3 10/14/2013 0930       Lab Results  Component Value Date   WBC 4.7 04/04/2015   HGB 13.4 04/04/2015   HCT 39.4 04/04/2015   MCV 90.0 04/04/2015   PLT 150 04/04/2015   NEUTROABS 2.3 04/04/2015     RADIOGRAPHIC STUDIES: I have personally reviewed the radiology reports and agreed with their findings. Mammogram done at [facility] on 10/28/2014 is normal breast density category B  ASSESSMENT & PLAN:  Breast cancer of lower-inner quadrant of right female breast Right breast invasive ductal carcinoma T2 N0 M0 stage II a grade 2, triple negative, Ki-67 80% status post neoadjuvant chemotherapy with AC 4 followed by Taxol Carbo weekly 12 completed 03/18/2014 status post lumpectomy 04/18/2014 showed grade 3 invasive ductal carcinoma triple negative status post radiation  therapy completed 06/23/2014 currently on observation  Breast cancer surveillance: 1. Breast exam 04/04/2015 is normal 2. 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 She has plastic out to [place] coming up. I recommended that she continue with exercise daily. She complains of a cough which appears to be related to her allergies. I encouraged her to eat less red meat and more fruits and vegetables.  Her blood counts were reviewed and her white count has normalized. Return to clinic in 6 months with blood work and follow-up.    Orders Placed This Encounter  Procedures  . CBC with Differential    Standing Status: Future     Number of Occurrences:      Standing Expiration Date: 04/03/2016  . Comprehensive metabolic panel (Cmet) - [facility]    Standing Status: Future     Number of Occurrences:      Standing Expiration Date: 04/03/2016   The patient has a good understanding of the overall plan. she agrees with it. She will call with  any problems that may develop before her next visit here.   Rulon Eisenmenger, MD   Decided not to for the first cycle if he wants to continue that might consider his eyes disease is widespread I tried to talk him into hospice he wants

## 2015-04-04 NOTE — Assessment & Plan Note (Signed)
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 Breast exam 04/04/2015 is normal 2. 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 She has plastic out to [place] coming up. I recommended that she continue with exercise daily. She complains of a cough which appears to be related to her allergies. I encouraged her to eat less red meat and more fruits and vegetables.  Her blood counts were reviewed and her white count has normalized. Return to clinic in 6 months with blood work and follow-up.

## 2015-04-04 NOTE — Telephone Encounter (Signed)
Appointments made and avs pritned for patinet

## 2015-04-04 NOTE — Progress Notes (Signed)
mammoogram results dtd 10/28/14 rcvd from [facility].  Reviewed by Dr. Lindi Adie.  Sent to scan.

## 2015-06-13 ENCOUNTER — Encounter: Payer: Self-pay | Admitting: Hematology and Oncology

## 2015-06-13 DIAGNOSIS — C50311 Malignant neoplasm of lower-inner quadrant of right female breast: Secondary | ICD-10-CM

## 2015-06-14 MED ORDER — PREGABALIN 100 MG PO CAPS: ORAL_CAPSULE | ORAL | Status: DC

## 2015-09-29 ENCOUNTER — Telehealth: Payer: Self-pay | Admitting: Hematology and Oncology

## 2015-09-29 NOTE — Telephone Encounter (Signed)
returned call and lvm fo rpt regarding to 10.6 appt moved to 10.17 per pt request.

## 2015-10-05 ENCOUNTER — Ambulatory Visit: Payer: BLUE CROSS/BLUE SHIELD | Admitting: Hematology and Oncology

## 2015-10-05 ENCOUNTER — Other Ambulatory Visit: Payer: BLUE CROSS/BLUE SHIELD

## 2015-10-15 NOTE — Assessment & Plan Note (Signed)
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 Breast exam 04/04/2015 is normal 2. Mammogram 10/28/2014 is normal  Her blood counts were reviewed and her white count has normalized. Return to clinic in 6 months with blood work and follow-up.

## 2015-10-16 ENCOUNTER — Other Ambulatory Visit (HOSPITAL_BASED_OUTPATIENT_CLINIC_OR_DEPARTMENT_OTHER): Payer: BLUE CROSS/BLUE SHIELD

## 2015-10-16 ENCOUNTER — Encounter: Payer: Self-pay | Admitting: Hematology and Oncology

## 2015-10-16 ENCOUNTER — Ambulatory Visit (HOSPITAL_BASED_OUTPATIENT_CLINIC_OR_DEPARTMENT_OTHER): Payer: BLUE CROSS/BLUE SHIELD | Admitting: Hematology and Oncology

## 2015-10-16 VITALS — BP 126/71 | HR 95 | Temp 98.2°F | Resp 18 | Ht 66.0 in | Wt 200.4 lb

## 2015-10-16 DIAGNOSIS — Z171 Estrogen receptor negative status [ER-]: Secondary | ICD-10-CM

## 2015-10-16 DIAGNOSIS — C50311 Malignant neoplasm of lower-inner quadrant of right female breast: Secondary | ICD-10-CM | POA: Diagnosis not present

## 2015-10-16 LAB — CBC WITH DIFFERENTIAL/PLATELET
BASO%: 0.6 % (ref 0.0–2.0)
BASOS ABS: 0 10*3/uL (ref 0.0–0.1)
EOS%: 1.5 % (ref 0.0–7.0)
Eosinophils Absolute: 0.1 10*3/uL (ref 0.0–0.5)
HEMATOCRIT: 39.8 % (ref 34.8–46.6)
HGB: 13.3 g/dL (ref 11.6–15.9)
LYMPH#: 1.7 10*3/uL (ref 0.9–3.3)
LYMPH%: 33.6 % (ref 14.0–49.7)
MCH: 30 pg (ref 25.1–34.0)
MCHC: 33.5 g/dL (ref 31.5–36.0)
MCV: 89.5 fL (ref 79.5–101.0)
MONO#: 0.4 10*3/uL (ref 0.1–0.9)
MONO%: 7.8 % (ref 0.0–14.0)
NEUT#: 2.8 10*3/uL (ref 1.5–6.5)
NEUT%: 56.5 % (ref 38.4–76.8)
Platelets: 141 10*3/uL — ABNORMAL LOW (ref 145–400)
RBC: 4.45 10*6/uL (ref 3.70–5.45)
RDW: 14.2 % (ref 11.2–14.5)
WBC: 5 10*3/uL (ref 3.9–10.3)

## 2015-10-16 LAB — COMPREHENSIVE METABOLIC PANEL (CC13)
ALT: 26 U/L (ref 0–55)
AST: 19 U/L (ref 5–34)
Albumin: 4.2 g/dL (ref 3.5–5.0)
Alkaline Phosphatase: 83 U/L (ref 40–150)
Anion Gap: 7 mEq/L (ref 3–11)
BUN: 9.2 mg/dL (ref 7.0–26.0)
CALCIUM: 9.4 mg/dL (ref 8.4–10.4)
CHLORIDE: 109 meq/L (ref 98–109)
CO2: 29 meq/L (ref 22–29)
Creatinine: 0.8 mg/dL (ref 0.6–1.1)
EGFR: 83 mL/min/{1.73_m2} — ABNORMAL LOW (ref >90)
Glucose: 101 mg/dl (ref 70–140)
POTASSIUM: 4.2 meq/L (ref 3.5–5.1)
Sodium: 144 mEq/L (ref 136–145)
Total Bilirubin: 0.52 mg/dL (ref 0.20–1.20)
Total Protein: 6.7 g/dL (ref 6.4–8.3)

## 2015-10-16 NOTE — Progress Notes (Signed)
Patient Care Team: [name], PA-C as PCP - General (Physician Assistant)  DIAGNOSIS: Breast cancer of lower-inner quadrant of right female breast [facility])   Staging form: Breast, AJCC 7th Edition     Clinical: Stage IIA (T2, N0, cM0) - Unsigned       Staging comments: Staged at breast conference 10.8.14      Pathologic: No stage assigned - Unsigned   SUMMARY OF ONCOLOGIC HISTORY:   Breast cancer of lower-inner quadrant of right female breast ([place])   08/03/2013 - 03/18/2014 Neo-Adjuvant Chemotherapy Adriamycin and Cytoxan 4 followed by Taxol and carboplatin weekly 12   04/08/2014 Surgery Right breast lumpectomy T2 N0 M0 stage II 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 She is currently on surveillance. She is triple negative disease. So she did not need any antiestrogen therapy. She denies any lumps or nodules in the breast. She had a mammogram today which was reportedly normal. 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  REVIEW OF SYSTEMS:   Constitutional: Denies fevers, chills or abnormal weight loss Eyes: Denies blurriness of vision Ears, nose, mouth, throat, and face: Denies mucositis or sore throat Respiratory: Denies cough, dyspnea or wheezes Cardiovascular: Denies palpitation, chest discomfort or lower extremity swelling Gastrointestinal:  Denies nausea, heartburn or change in bowel habits Skin: Denies abnormal skin rashes Lymphatics: Denies new lymphadenopathy or easy bruising Neurological: Mild numbness in the feet Behavioral/Psych: Mood is stable, no new changes  Breast:  denies any pain or lumps or nodules in either breasts All other systems were reviewed with the patient and are  negative.  I have reviewed the past medical history, past surgical history, social history and family history with the patient and they are unchanged from previous note.  ALLERGIES:  is allergic to penicillins.  MEDICATIONS:  Current Outpatient Prescriptions  Medication Sig Dispense Refill  . albuterol (PROVENTIL HFA;VENTOLIN HFA) 108 (90 BASE) MCG/ACT inhaler Inhale into the lungs every 6 (six) hours as needed for wheezing or shortness of breath.    Marland Kitchen aspirin 81 MG tablet Take 81 mg by mouth daily.    . B Complex-C (SUPER B COMPLEX PO) Take 1 tablet by mouth daily.     . cholecalciferol (VITAMIN D) 400 UNITS TABS tablet Take 800 Units by mouth daily.     . Hydrocodone-Guaifenesin 2.5-200 MG/5ML SOLN Take by mouth.    Marland Kitchen ibuprofen (ADVIL,MOTRIN) 200 MG tablet Take 400 mg by mouth every 6 (six) hours as needed for moderate pain.    . pregabalin (LYRICA) 100 MG capsule TAKE ONE CAPSULE BY MOUTH THREE TIMES DAILY 90 capsule 5  . VITAMIN E PO Take 1 capsule by mouth daily.     No current facility-administered medications for this visit.    PHYSICAL EXAMINATION: ECOG PERFORMANCE STATUS: 1 - Symptomatic but completely ambulatory  Filed Vitals:   10/16/15 1508  BP: 126/71  Pulse: 95  Temp: 98.2 F (36.8 C)  Resp: 18   Filed Weights   10/16/15 1508  Weight: 200 lb 6.4 oz (90.901 kg)    GENERAL:alert, no distress and comfortable SKIN: skin color, texture, turgor are normal, no rashes or significant lesions EYES: normal, Conjunctiva are pink and non-injected, sclera clear OROPHARYNX:no exudate, no erythema and lips, buccal mucosa, and  tongue normal  NECK: supple, thyroid normal size, non-tender, without nodularity LYMPH:  no palpable lymphadenopathy in the cervical, axillary or inguinal LUNGS: clear to auscultation and percussion with normal breathing effort HEART: regular rate & rhythm and no murmurs and no lower extremity edema ABDOMEN:abdomen soft, non-tender and normal bowel  sounds Musculoskeletal:no cyanosis of digits and no clubbing  NEURO: alert & oriented x 3 with fluent speech, no focal motor/sensory deficits BREAST: No palpable masses or nodules in either right or left breasts. No palpable axillary supraclavicular or infraclavicular adenopathy no breast tenderness or nipple discharge. (exam performed in the presence of a chaperone)  LABORATORY DATA:  I have reviewed the data as listed   Chemistry      Component Value Date/Time   NA 144 10/16/2015 1448   NA 140 10/14/2013 0930   K 4.2 10/16/2015 1448   K 4.7 10/14/2013 0930   CL 105 10/14/2013 0930   CO2 29 10/16/2015 1448   CO2 29 10/14/2013 0930   BUN 9.2 10/16/2015 1448   BUN 12 10/14/2013 0930   CREATININE 0.8 10/16/2015 1448   CREATININE 0.71 10/14/2013 0930      Component Value Date/Time   CALCIUM 9.4 10/16/2015 1448   CALCIUM 9.6 10/14/2013 0930   ALKPHOS 83 10/16/2015 1448   ALKPHOS 70 10/14/2013 0930   AST 19 10/16/2015 1448   AST 16 10/14/2013 0930   ALT 26 10/16/2015 1448   ALT 18 10/14/2013 0930   BILITOT 0.52 10/16/2015 1448   BILITOT 0.3 10/14/2013 0930       Lab Results  Component Value Date   WBC 5.0 10/16/2015   HGB 13.3 10/16/2015   HCT 39.8 10/16/2015   MCV 89.5 10/16/2015   PLT 141* 10/16/2015   NEUTROABS 2.8 10/16/2015   ASSESSMENT & PLAN:  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 Breast exam 10/16/2015 is normal 2. Mammogram 10/28/2014 is normal  Her blood counts were reviewed and her white count has normalized. Return to clinic in 6 months with  blood work and follow-up after that we will see her once a year.  No orders of the defined types were placed in this encounter.   The patient has a good understanding of the overall plan. she agrees with it. she will call with any problems that may develop before the next visit here.   Rulon Eisenmenger, MD 10/16/2015

## 2015-10-16 NOTE — Addendum Note (Signed)
Addended by: Prentiss Bells on: 10/16/2015 05:22 PM   Modules accepted: Medications

## 2015-10-17 ENCOUNTER — Telehealth: Payer: Self-pay | Admitting: Hematology and Oncology

## 2015-10-17 NOTE — Telephone Encounter (Signed)
Called and left a message with 6month appointment °

## 2015-10-27 ENCOUNTER — Encounter: Payer: Self-pay | Admitting: *Deleted

## 2015-10-27 NOTE — Progress Notes (Signed)
Received mammo report from [facility], sent to scan. 

## 2016-04-04 ENCOUNTER — Telehealth: Payer: Self-pay

## 2016-04-04 NOTE — Telephone Encounter (Signed)
LMOVM - overbooked on 4/18. Pt to return call if we can reschedule her 

## 2016-04-05 ENCOUNTER — Telehealth: Payer: Self-pay | Admitting: Hematology and Oncology

## 2016-04-05 NOTE — Telephone Encounter (Signed)
Pt called needing to r/s April appt [place]. Scheduled appts per pt preference.

## 2016-04-16 ENCOUNTER — Other Ambulatory Visit: Payer: BLUE CROSS/BLUE SHIELD

## 2016-04-16 ENCOUNTER — Ambulatory Visit: Payer: BLUE CROSS/BLUE SHIELD | Admitting: Hematology and Oncology

## 2016-04-29 ENCOUNTER — Other Ambulatory Visit: Payer: BLUE CROSS/BLUE SHIELD

## 2016-04-29 ENCOUNTER — Ambulatory Visit: Payer: BLUE CROSS/BLUE SHIELD | Admitting: Hematology and Oncology

## 2016-05-06 ENCOUNTER — Telehealth: Payer: Self-pay | Admitting: Hematology and Oncology

## 2016-05-06 ENCOUNTER — Other Ambulatory Visit (HOSPITAL_BASED_OUTPATIENT_CLINIC_OR_DEPARTMENT_OTHER): Payer: Managed Care, Other (non HMO)

## 2016-05-06 ENCOUNTER — Ambulatory Visit (HOSPITAL_BASED_OUTPATIENT_CLINIC_OR_DEPARTMENT_OTHER): Payer: Managed Care, Other (non HMO) | Admitting: Hematology and Oncology

## 2016-05-06 ENCOUNTER — Encounter: Payer: Self-pay | Admitting: Hematology and Oncology

## 2016-05-06 VITALS — BP 113/62 | HR 72 | Temp 98.2°F | Resp 18 | Wt 192.9 lb

## 2016-05-06 DIAGNOSIS — C50311 Malignant neoplasm of lower-inner quadrant of right female breast: Secondary | ICD-10-CM

## 2016-05-06 DIAGNOSIS — Z171 Estrogen receptor negative status [ER-]: Secondary | ICD-10-CM | POA: Diagnosis not present

## 2016-05-06 LAB — CBC WITH DIFFERENTIAL/PLATELET
BASO%: 0.2 % (ref 0.0–2.0)
BASOS ABS: 0 10*3/uL (ref 0.0–0.1)
EOS ABS: 0.1 10*3/uL (ref 0.0–0.5)
EOS%: 2 % (ref 0.0–7.0)
HEMATOCRIT: 41.8 % (ref 34.8–46.6)
HEMOGLOBIN: 14.1 g/dL (ref 11.6–15.9)
LYMPH%: 35.6 % (ref 14.0–49.7)
MCH: 30.3 pg (ref 25.1–34.0)
MCHC: 33.7 g/dL (ref 31.5–36.0)
MCV: 89.7 fL (ref 79.5–101.0)
MONO#: 0.4 10*3/uL (ref 0.1–0.9)
MONO%: 8.5 % (ref 0.0–14.0)
NEUT%: 53.7 % (ref 38.4–76.8)
NEUTROS ABS: 2.5 10*3/uL (ref 1.5–6.5)
NRBC: 0 % (ref 0–0)
PLATELETS: 135 10*3/uL — AB (ref 145–400)
RBC: 4.66 10*6/uL (ref 3.70–5.45)
RDW: 13.8 % (ref 11.2–14.5)
WBC: 4.6 10*3/uL (ref 3.9–10.3)
lymph#: 1.6 10*3/uL (ref 0.9–3.3)

## 2016-05-06 LAB — COMPREHENSIVE METABOLIC PANEL
ALK PHOS: 76 U/L (ref 40–150)
ALT: 28 U/L (ref 0–55)
ANION GAP: 5 meq/L (ref 3–11)
AST: 21 U/L (ref 5–34)
Albumin: 4.2 g/dL (ref 3.5–5.0)
BILIRUBIN TOTAL: 0.45 mg/dL (ref 0.20–1.20)
BUN: 13.9 mg/dL (ref 7.0–26.0)
CALCIUM: 9.9 mg/dL (ref 8.4–10.4)
CO2: 30 meq/L — AB (ref 22–29)
CREATININE: 0.8 mg/dL (ref 0.6–1.1)
Chloride: 109 mEq/L (ref 98–109)
EGFR: 89 mL/min/{1.73_m2} — ABNORMAL LOW (ref >90)
Glucose: 93 mg/dl (ref 70–140)
Potassium: 4.4 mEq/L (ref 3.5–5.1)
Sodium: 144 mEq/L (ref 136–145)
TOTAL PROTEIN: 6.8 g/dL (ref 6.4–8.3)

## 2016-05-06 NOTE — Progress Notes (Signed)
Patient Care Team: [name], PA-C as PCP - General (Physician Assistant)  DIAGNOSIS: Breast cancer of lower-inner quadrant of right female breast [facility])   Staging form: Breast, AJCC 7th Edition     Clinical: Stage IIA (T2, N0, cM0) - Unsigned       Staging comments: Staged at breast conference 10.8.14      Pathologic: No stage assigned - Unsigned   SUMMARY OF ONCOLOGIC HISTORY:   Breast cancer of lower-inner quadrant of right female breast ([place])   08/03/2013 - 03/18/2014 Neo-Adjuvant Chemotherapy Adriamycin and Cytoxan 4 followed by Taxol and carboplatin weekly 12   04/08/2014 Surgery Right breast lumpectomy T2 N0 M0 stage II 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 She does not have any lumps or nodules in the breasts. She'Natalie been feeling fairly well. 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 She also has trouble with sense of smell.  REVIEW OF SYSTEMS:   Constitutional: Denies fevers, chills or abnormal weight loss Eyes: Denies blurriness of vision Ears, nose, mouth, throat, and face: Denies mucositis or sore throat Respiratory: Denies cough, dyspnea or wheezes Cardiovascular: Denies palpitation, chest discomfort Gastrointestinal:  Denies nausea, heartburn or change in bowel habits Skin: Denies abnormal skin rashes Lymphatics: Denies new lymphadenopathy or easy bruising Neurological: Neuropathy in the feet Behavioral/Psych: Mood is stable, no new changes  Extremities: No lower extremity edema Breast:  denies any pain or lumps or nodules in either breasts All other systems were reviewed with the patient and are negative.  I  have reviewed the past medical history, past surgical history, social history and family history with the patient and they are unchanged from previous note.  ALLERGIES:  is allergic to penicillins.  MEDICATIONS:  Current Outpatient Prescriptions  Medication Sig Dispense Refill  . albuterol (PROVENTIL HFA;VENTOLIN HFA) 108 (90 BASE) MCG/ACT inhaler Inhale into the lungs every 6 (six) hours as needed for wheezing or shortness of breath.    Marland Kitchen aspirin 81 MG tablet Take 81 mg by mouth daily.    . B Complex-C (SUPER B COMPLEX PO) Take 1 tablet by mouth daily.     . cholecalciferol (VITAMIN D) 400 UNITS TABS tablet Take 800 Units by mouth daily.     . Hydrocodone-Guaifenesin 2.5-200 MG/5ML SOLN Take by mouth.    Marland Kitchen ibuprofen (ADVIL,MOTRIN) 200 MG tablet Take 400 mg by mouth every 6 (six) hours as needed for moderate pain.    . pregabalin (LYRICA) 100 MG capsule TAKE ONE CAPSULE BY MOUTH THREE TIMES DAILY 90 capsule 5  . VITAMIN E PO Take 1 capsule by mouth daily.     No current facility-administered medications for this visit.    PHYSICAL EXAMINATION: ECOG PERFORMANCE STATUS: 1 - Symptomatic but completely ambulatory  Filed Vitals:   05/06/16 0854  BP: 113/62  Pulse: 72  Temp: 98.2 F (36.8 C)  Resp: 18   Filed Weights   05/06/16 0854  Weight: 192 lb 14.4 oz (87.499 kg)    GENERAL:alert, no distress and comfortable SKIN: skin color, texture, turgor are normal, no rashes or significant lesions EYES: normal, Conjunctiva are pink and non-injected, sclera clear OROPHARYNX:no exudate, no erythema and lips, buccal mucosa, and tongue  normal  NECK: supple, thyroid normal size, non-tender, without nodularity LYMPH:  no palpable lymphadenopathy in the cervical, axillary or inguinal LUNGS: clear to auscultation and percussion with normal breathing effort HEART: regular rate & rhythm and no murmurs and no lower extremity edema ABDOMEN:abdomen soft, non-tender and normal bowel  sounds MUSCULOSKELETAL:no cyanosis of digits and no clubbing  NEURO: alert & oriented x 3 with fluent speech, Neuropathy in the feet grade 1 EXTREMITIES: No lower extremity edema  LABORATORY DATA:  I have reviewed the data as listed   Chemistry      Component Value Date/Time   NA 144 05/06/2016 0838   NA 140 10/14/2013 0930   K 4.4 05/06/2016 0838   K 4.7 10/14/2013 0930   CL 105 10/14/2013 0930   CO2 30* 05/06/2016 0838   CO2 29 10/14/2013 0930   BUN 13.9 05/06/2016 0838   BUN 12 10/14/2013 0930   CREATININE 0.8 05/06/2016 0838   CREATININE 0.71 10/14/2013 0930      Component Value Date/Time   CALCIUM 9.9 05/06/2016 0838   CALCIUM 9.6 10/14/2013 0930   ALKPHOS 76 05/06/2016 0838   ALKPHOS 70 10/14/2013 0930   AST 21 05/06/2016 0838   AST 16 10/14/2013 0930   ALT 28 05/06/2016 0838   ALT 18 10/14/2013 0930   BILITOT 0.45 05/06/2016 0838   BILITOT 0.3 10/14/2013 0930       Lab Results  Component Value Date   WBC 4.6 05/06/2016   HGB 14.1 05/06/2016   HCT 41.8 05/06/2016   MCV 89.7 05/06/2016   PLT 135* 05/06/2016   NEUTROABS 2.5 05/06/2016     ASSESSMENT & PLAN:  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 Breast exam 05/06/16 is normal 2. Mammogram 10/16/15 is normal done at [place]  Return to clinic in one year.   No orders of the defined types were placed in this encounter.   The patient has a good understanding of the overall plan. she agrees with it. she will call with any problems that may develop before the next visit here.   Rulon Eisenmenger,  MD 05/06/2016

## 2016-05-06 NOTE — Telephone Encounter (Signed)
appt made and avs printed °

## 2016-05-06 NOTE — Assessment & Plan Note (Signed)
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 Breast exam 05/06/16 is normal 2. Mammogram 10/16/15 is normal done at [place]  Return to clinic in one year.

## 2016-05-21 ENCOUNTER — Ambulatory Visit: Payer: BLUE CROSS/BLUE SHIELD | Admitting: Hematology and Oncology

## 2016-05-21 ENCOUNTER — Other Ambulatory Visit: Payer: BLUE CROSS/BLUE SHIELD

## 2016-08-23 ENCOUNTER — Encounter: Payer: Self-pay | Admitting: Genetic Counselor

## 2016-08-23 DIAGNOSIS — Z1379 Encounter for other screening for genetic and chromosomal anomalies: Secondary | ICD-10-CM | POA: Insufficient documentation

## 2016-10-03 ENCOUNTER — Telehealth: Payer: Self-pay | Admitting: Genetic Counselor

## 2016-10-03 NOTE — Telephone Encounter (Signed)
LM on VM with good news on a reclassification of her result.  Asked that she call back.

## 2016-12-14 ENCOUNTER — Other Ambulatory Visit: Payer: Self-pay | Admitting: Nurse Practitioner

## 2016-12-19 ENCOUNTER — Encounter: Payer: Self-pay | Admitting: Genetic Counselor

## 2017-05-06 ENCOUNTER — Ambulatory Visit: Payer: Managed Care, Other (non HMO) | Admitting: Hematology and Oncology

## 2017-05-27 ENCOUNTER — Ambulatory Visit (HOSPITAL_BASED_OUTPATIENT_CLINIC_OR_DEPARTMENT_OTHER): Payer: Managed Care, Other (non HMO) | Admitting: Hematology and Oncology

## 2017-05-27 ENCOUNTER — Encounter: Payer: Self-pay | Admitting: Hematology and Oncology

## 2017-05-27 DIAGNOSIS — Z171 Estrogen receptor negative status [ER-]: Principal | ICD-10-CM

## 2017-05-27 DIAGNOSIS — C50311 Malignant neoplasm of lower-inner quadrant of right female breast: Secondary | ICD-10-CM

## 2017-05-27 DIAGNOSIS — Z853 Personal history of malignant neoplasm of breast: Secondary | ICD-10-CM

## 2017-05-27 NOTE — Progress Notes (Signed)
Patient Care Team: [name], PA-C as PCP - General (Physician Assistant)  DIAGNOSIS:  Encounter Diagnosis  Name Primary?  . 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  The Breast/[facility] gene panel offered by [facility] includes sequencing and rearrangement analysis for the following 21 genes:  ATM, BARD1, BRCA1, BRCA2, BRIP1, CDH1, CHEK2, EPCAM, FANCC, MLH1, MSH2, MSH6, NBN, PALB2, PMS2, PTEN, RAD51C, RAD51D, STK11, TP53, and XRCC2.     UPDATE: BARD1 c.1977A>G VUS was reclassified from a VUS to a likely benign variant based on a combination of sources, e.g. Internal data, published literature, population databases and in silico models.       CHIEF COMPLIANT: Surveillance of breast cancer  INTERVAL HISTORY: Natalie Arias is a 56 year old with above-mentioned history of right breast cancer treated with neoadjuvant chemotherapy followed by lumpectomy and radiation. She is currently on surveillance and reports no new problems or concerns. She denies any pain or discomfort in the breasts. Denies any lumps or nodules.  REVIEW OF SYSTEMS:   Constitutional: Denies fevers, chills or abnormal weight loss Eyes: Denies blurriness of vision Ears, nose, mouth, throat, and face: Denies mucositis or sore throat Respiratory: Denies  cough, dyspnea or wheezes Cardiovascular: Denies palpitation, chest discomfort Gastrointestinal:  Denies nausea, heartburn or change in bowel habits Skin: Denies abnormal skin rashes Lymphatics: Denies new lymphadenopathy or easy bruising Neurological:Denies numbness, tingling or new weaknesses Behavioral/Psych: Mood is stable, no new changes  Extremities: No lower extremity edema Breast:  denies any pain or lumps or nodules in either breasts All other systems were reviewed with the patient and are negative.  I have reviewed the past medical history, past surgical history, social history and family history with the patient and they are unchanged from previous note.  ALLERGIES:  is allergic to penicillins.  MEDICATIONS:  Current Outpatient Prescriptions  Medication Sig Dispense Refill  . albuterol (PROVENTIL HFA;VENTOLIN HFA) 108 (90 BASE) MCG/ACT inhaler Inhale into the lungs every 6 (six) hours as needed for wheezing or shortness of breath.    Marland Kitchen aspirin 81 MG tablet Take 81 mg by mouth daily.    . B Complex-C (SUPER B COMPLEX PO) Take 1 tablet by mouth daily.     . cholecalciferol (VITAMIN D) 400 UNITS TABS tablet Take 800 Units by mouth daily.     . Hydrocodone-Guaifenesin 2.5-200 MG/5ML SOLN Take by mouth.    Marland Kitchen ibuprofen (ADVIL,MOTRIN) 200 MG tablet Take 400 mg by mouth every 6 (six) hours as needed for moderate pain.    . pregabalin (LYRICA) 100 MG capsule TAKE ONE CAPSULE BY MOUTH THREE TIMES DAILY 90 capsule 5  . VITAMIN E PO Take 1 capsule by mouth daily.     No current facility-administered medications for this visit.     PHYSICAL EXAMINATION:  ECOG PERFORMANCE STATUS: 0 - Asymptomatic  Vitals:   05/27/17 1511  BP: 110/63  Pulse: 76  Resp: 18  Temp: 98.1 F (36.7 C)   Filed Weights   05/27/17 1511  Weight: 182 lb 11.2 oz (82.9 kg)    GENERAL:alert, no distress and comfortable SKIN: skin color, texture, turgor are normal, no rashes or significant lesions EYES:  normal, Conjunctiva are pink and non-injected, sclera clear OROPHARYNX:no exudate, no erythema and lips, buccal mucosa, and tongue normal  NECK: supple, thyroid normal size, non-tender, without nodularity LYMPH:  no palpable lymphadenopathy in the cervical, axillary or inguinal LUNGS: clear to auscultation and percussion with normal breathing effort HEART: regular rate & rhythm and no murmurs and no lower extremity edema ABDOMEN:abdomen soft, non-tender and normal bowel sounds MUSCULOSKELETAL:no cyanosis of digits and no clubbing  NEURO: alert & oriented x 3 with fluent speech, no focal motor/sensory deficits EXTREMITIES: No lower extremity edema BREAST: No palpable masses or nodules in either right or left breasts. No palpable axillary supraclavicular or infraclavicular adenopathy no breast tenderness or nipple discharge. (exam performed in the presence of a chaperone)  LABORATORY DATA:  I have reviewed the data as listed   Chemistry      Component Value Date/Time   NA 144 05/06/2016 0838   K 4.4 05/06/2016 0838   CL 105 10/14/2013 0930   CO2 30 (H) 05/06/2016 0838   BUN 13.9 05/06/2016 0838   CREATININE 0.8 05/06/2016 0838      Component Value Date/Time   CALCIUM 9.9 05/06/2016 0838   ALKPHOS 76 05/06/2016 0838   AST 21 05/06/2016 0838   ALT 28 05/06/2016 0838   BILITOT 0.45 05/06/2016 0838       Lab Results  Component Value Date   WBC 4.6 05/06/2016   HGB 14.1 05/06/2016   HCT 41.8 05/06/2016   MCV 89.7 05/06/2016   PLT 135 (L) 05/06/2016   NEUTROABS 2.5 05/06/2016    ASSESSMENT & PLAN:  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 Breast exam 05/27/2017 is normal 2. [facility]ogram is [facility] at [facility] October 2017  Survivorship counseling: Discussed the importance of physical exercise in decreasing the likelihood of breast cancer recurrence. Recommended 30 mins daily 6 days a week of either brisk walking or cycling or swimming. Encouraged patient to eat more fruits and vegetables and decrease red meat.   Return to clinic in one year.  I spent 15 minutes talking to the patient of which more than half was spent in counseling and coordination of care.  No orders of the defined types were placed in this encounter.  The patient has a good understanding of the overall plan. she agrees with it. she will call with any problems that may develop before the next visit here.   Rulon Eisenmenger, MD 05/27/17

## 2017-05-27 NOTE — Assessment & Plan Note (Signed)
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 Breast exam 05/27/2017 is normal 2. Mammogram is normal done at [place]  Return to clinic in one year.

## 2018-05-27 ENCOUNTER — Inpatient Hospital Stay: Payer: Managed Care, Other (non HMO) | Attending: Hematology and Oncology | Admitting: Hematology and Oncology

## 2018-05-27 DIAGNOSIS — Z171 Estrogen receptor negative status [ER-]: Secondary | ICD-10-CM

## 2018-05-27 DIAGNOSIS — C50311 Malignant neoplasm of lower-inner quadrant of right female breast: Secondary | ICD-10-CM | POA: Diagnosis not present

## 2018-05-27 DIAGNOSIS — Z853 Personal history of malignant neoplasm of breast: Secondary | ICD-10-CM | POA: Insufficient documentation

## 2018-05-27 NOTE — Progress Notes (Signed)
Patient Care Team: [name], PA-C as PCP - General (Physician Assistant)  DIAGNOSIS:  Encounter Diagnosis  Name Primary?  . 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  The Breast/Ovarian gene panel offered by [facility] includes sequencing and rearrangement analysis for the following 21 genes:  ATM, BARD1, BRCA1, BRCA2, BRIP1, CDH1, CHEK2, EPCAM, FANCC, MLH1, MSH2, MSH6, NBN, PALB2, PMS2, PTEN, RAD51C, RAD51D, STK11, TP53, and XRCC2.     UPDATE: BARD1 c.1977A>G VUS was reclassified from a VUS to a likely benign variant based on a combination of sources, e.g. Internal data, published literature, population databases and in silico models.       CHIEF COMPLIANT: Follow-up of history of breast cancer  INTERVAL HISTORY: Natalie Arias is a 57 year old with above-mentioned history of right breast cancer triple negative disease treated with neoadjuvant chemotherapy followed by lumpectomy and radiation.  She is currently in surveillance and is here for annual breast checkup.  She denies any lumps or nodules in the breast.  REVIEW OF SYSTEMS:   Constitutional: Denies fevers, chills or abnormal weight loss Eyes: Denies blurriness of vision Ears, nose, mouth, throat, and face: Denies mucositis or sore throat Respiratory: Denies  cough, dyspnea or wheezes Cardiovascular: Denies palpitation, chest discomfort Gastrointestinal:  Denies nausea, heartburn or change in bowel habits Skin: Denies abnormal skin rashes Lymphatics: Denies new lymphadenopathy or easy bruising Neurological:Denies numbness, tingling or new weaknesses Behavioral/Psych: Mood is stable, no new changes  Extremities: No lower extremity edema Breast:  denies any pain or lumps or nodules in either breasts All other systems were reviewed with the patient and are negative.  I have reviewed the past medical history, past surgical history, social history and family history with the patient and they are unchanged from previous note.  ALLERGIES:  is allergic to penicillins.  MEDICATIONS:  Current Outpatient Medications  Medication Sig Dispense Refill  . aspirin 81 MG tablet Take 81 mg by mouth daily.    . B Complex-C (SUPER B COMPLEX PO) Take 1 tablet by mouth daily.     . cholecalciferol (VITAMIN D) 400 UNITS TABS tablet Take 800 Units by mouth daily.     Marland Kitchen ibuprofen (ADVIL,MOTRIN) 200 MG tablet Take 400 mg by mouth every 6 (six) hours as needed for moderate pain.    Marland Kitchen VITAMIN E PO Take 1 capsule by mouth daily.     No current facility-administered medications for this visit.     PHYSICAL EXAMINATION: ECOG PERFORMANCE STATUS: 1 - Symptomatic but completely ambulatory  Vitals:   05/27/18 1417  BP: 118/60  Pulse: 81  Resp: 18  Temp: 98.7 F (37.1 C)  SpO2: 98%   Filed Weights   05/27/18 1417  Weight: 178 lb 11.2 oz (81.1 kg)    GENERAL:alert, no distress and  comfortable SKIN: skin color, texture, turgor are normal, no rashes or significant lesions EYES: normal, Conjunctiva are pink and non-injected, sclera clear OROPHARYNX:no exudate, no erythema and lips, buccal mucosa, and tongue normal  NECK: supple, thyroid normal size, non-tender, without nodularity LYMPH:  no palpable lymphadenopathy in the cervical, axillary or inguinal LUNGS:  clear to auscultation and percussion with normal breathing effort HEART: regular rate & rhythm and no murmurs and no lower extremity edema ABDOMEN:abdomen soft, non-tender and normal bowel sounds MUSCULOSKELETAL:no cyanosis of digits and no clubbing  NEURO: alert & oriented x 3 with fluent speech, no focal motor/sensory deficits EXTREMITIES: No lower extremity edema BREAST: No palpable masses or nodules in either right or left breasts. No palpable axillary supraclavicular or infraclavicular adenopathy no breast tenderness or nipple discharge. (exam performed in the presence of a chaperone)  LABORATORY DATA:  I have reviewed the data as listed CMP Latest Ref Rng & Units 05/06/2016 10/16/2015 04/04/2015  Glucose 70 - 140 mg/dl 93 101 101  BUN 7.0 - 26.0 mg/dL 13.9 9.2 12.5  Creatinine 0.6 - 1.1 mg/dL 0.8 0.8 0.8  Sodium 136 - 145 mEq/L 144 144 143  Potassium 3.5 - 5.1 mEq/L 4.4 4.2 4.2  Chloride 96 - 112 mEq/L - - -  CO2 22 - 29 mEq/L 30(H) 29 28  Calcium 8.4 - 10.4 mg/dL 9.9 9.4 9.0  Total Protein 6.4 - 8.3 g/dL 6.8 6.7 6.5  Total Bilirubin 0.20 - 1.20 mg/dL 0.45 0.52 0.46  Alkaline Phos 40 - 150 U/L 76 83 106  AST 5 - 34 U/L _0 ALT 0 - 55 U/L _1 Lab Results  Component Value Date   WBC 4.6 05/06/2016   HGB 14.1 05/06/2016   HCT 41.8 05/06/2016   MCV 89.7 05/06/2016   PLT 135 (L) 05/06/2016   NEUTROABS 2.5 05/06/2016    ASSESSMENT & PLAN:  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 Breast exam 05/27/2018 is normal 2. Mammogram  at [facility]  11/05/2017: No  mammographic evidence of malignancy, breast density  Return to clinic in 1 year for follow-up with long-term survivorship clinic      No orders of the defined types were placed in this encounter.  The patient has a good understanding of the overall plan. she agrees with it. she will call with any problems that may develop before the next visit here.   Harriette Ohara, MD 05/27/18

## 2018-05-27 NOTE — Assessment & Plan Note (Signed)
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 Breast exam 05/27/2018 is normal 2. Mammogram  at [facility]  11/05/2017: No mammographic evidence of malignancy, breast density  Return to clinic in 1 year for follow-up with long-term survivorship clinic

## 2018-05-28 ENCOUNTER — Telehealth: Payer: Self-pay | Admitting: Hematology and Oncology

## 2018-05-28 NOTE — Telephone Encounter (Signed)
Mailed patient calendar of upcoming may 2020 appointments.

## 2018-11-05 ENCOUNTER — Encounter: Payer: Self-pay | Admitting: Hematology and Oncology

## 2019-04-19 ENCOUNTER — Telehealth: Payer: Self-pay | Admitting: Adult Health

## 2019-04-19 NOTE — Telephone Encounter (Signed)
Rescheduled LTS appt per sch msg and changed to webex visit. Patient will be contacted

## 2019-05-25 NOTE — Progress Notes (Signed)
SURVIVORSHIP VIRTUAL VISIT:  I connected with SP Derringer on 05/25/19 at  8:30 AM EDT by webex and verified that I am speaking with the correct person using two identifiers.   I discussed the limitations, risks, security and privacy concerns of performing an evaluation and management service by telephone/WEBEX and the availability of in person appointments. I also discussed with the patient that there may be a patient responsible charge related to this service. The patient expressed understanding and agreed to proceed.     REASON FOR VISIT:  Routine follow-up for history of breast cancer.   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  The Breast/Ovarian gene panel offered by [facility] includes sequencing and rearrangement analysis for the following 21 genes:  ATM, BARD1, BRCA1, BRCA2, BRIP1, CDH1, CHEK2, EPCAM, FANCC, MLH1, MSH2, MSH6, NBN, PALB2, PMS2, PTEN, RAD51C, RAD51D, STK11, TP53, and XRCC2.     UPDATE: BARD1 c.1977A>G VUS was reclassified from a VUS to a likely benign variant based on a combination of sources, e.g. Internal data, published literature, population databases and in silico models.      INTERVAL HISTORY:  [name] presents to the Survivorship Clinic today for routine follow-up for her history of breast cancer.  Overall, she reports feeling quite well. Since her last visit, she underwent bilateral breast mammogram on 11/05/2018  that showed no evidence of malignancy and breast density category C. S.P. has continued to work throughout the [place] from home.  She continues to see her PCP as needed, she typically goes once per year for exam and blood work.  She is up to date with colon cancer screen.  She has not had skin cancer screening.  She is s/p TAH.  She continues to undergo pap smears that her PCP does as she still has a cervix.  She is a never smoker.    She is exercising regularly by walking most days between 4 and 8 miles per occasion.  Her diet has not been the best during the social distancing time period.  She has been working for the most part she has been eating a well balanced diet.   She notes she has a new cream for her toenail fungus.   She has had this issue since completing chemotherapy and has tried many over the counter agents to help with it, without relief.  She notes that her nails are also more brittle, which is why she is taking biotin daily to see if it will help.     REVIEW OF SYSTEMS:  Review of Systems  Constitutional: Negative for appetite change, chills, fatigue, fever and unexpected weight change.  HENT:   Negative for hearing loss, lump/mass, nosebleeds, sore throat and voice change.   Eyes: Negative for eye problems and icterus.  Respiratory: Negative for chest tightness, cough and shortness of breath.   Cardiovascular: Negative for chest pain, leg swelling and palpitations.  Gastrointestinal: Negative for abdominal  distention, abdominal pain, constipation, diarrhea, nausea and vomiting.  Endocrine: Negative for hot flashes.  Genitourinary: Negative for difficulty urinating.   Musculoskeletal: Negative for arthralgias.  Skin: Negative for itching and rash.  Neurological: Negative for dizziness, extremity weakness, headaches and numbness.  Hematological: Negative for adenopathy. Does not bruise/bleed easily.  Psychiatric/Behavioral: Negative for depression. The patient is not  nervous/anxious.   Breast: Denies any new nodularity, masses, tenderness, nipple changes, or nipple discharge.       PAST MEDICAL/SURGICAL HISTORY:  Past Medical History:  Diagnosis Date  . Allergy   . Asthma 20's   allergic asthema  . Breast cancer ([place]) dx'd 10/01/2013   right; triplen negative  . PONV (postoperative nausea and vomiting)   . S/P radiation therapy 4-12 wks ago 05/09/14-06/23/14   rt breast 60.4Gy  . Seasonal allergies    Past Surgical History:  Procedure Laterality Date  . ABDOMINAL HYSTERECTOMY  1992   1 ovary left  . LAPAROSCOPIC ENDOMETRIOSIS FULGURATION  1991  . PARTIAL MASTECTOMY WITH NEEDLE LOCALIZATION AND AXILLARY SENTINEL LYMPH NODE BX Right 04/08/2014   Procedure: PARTIAL MASTECTOMY WITH NEEDLE LOCALIZATION AND AXILLARY SENTINEL LYMPH NODE BIOPSY;  Surgeon: Adin Hector, MD;  Location: [facility];  Service: General;  Laterality: Right;  . PORT-A-CATH REMOVAL Left 07/26/2014   Procedure: REMOVAL PORT-A-CATH;  Surgeon: Adin Hector, MD;  Location: [facility];  Service: General;  Laterality: Left;  . PORTACATH PLACEMENT N/A 10/18/2013   Procedure: INSERTION PORT-A-CATH WITH ULTRASOUND;  Surgeon: Adin Hector, MD;  Location: [facility];  Service: General;  Laterality: N/A;     ALLERGIES:  Allergies  Allergen Reactions  . Penicillins Hives     CURRENT MEDICATIONS:  Outpatient Encounter Medications as of 05/26/2019  Medication Sig  . aspirin 81 MG tablet Take 81 mg by mouth daily.  . B Complex-C (SUPER B COMPLEX PO) Take 1 tablet by mouth daily.   . cholecalciferol (VITAMIN D) 400 UNITS TABS tablet Take 800 Units by mouth daily.   Marland Kitchen ibuprofen (ADVIL,MOTRIN) 200 MG tablet Take 400 mg by mouth every 6 (six) hours as needed for moderate pain.  Marland Kitchen VITAMIN E PO Take 1 capsule by mouth daily.   No facility-administered encounter medications on file as of 05/26/2019.      ONCOLOGIC FAMILY HISTORY:  Family History   Problem Relation Age of Onset  . Lung cancer Maternal Uncle        smoker  . Breast cancer Paternal Aunt        dx in her late 25s; maternal half sister to patient's father  . Breast cancer Maternal Grandmother 63  . Lung cancer Maternal Uncle        smoker  . Hypertension Father   . Heart disease Father   . Parkinsonism Father   . Skin cancer Father   . Hypercholesterolemia Mother   . Gallstones Mother   . Healthy Brother   . COPD Maternal Grandfather   . Lung cancer Cousin        smoker; paternal cousin    GENETIC COUNSELING/TESTING: See above  SOCIAL HISTORY:  Social History   Socioeconomic History  . Marital status: Married    Spouse name: Not on file  . Number of children: 2  . Years of education: Not on file  . Highest education level: Not on file  Occupational History    Employer: [employer]  . Financial resource strain: Not on file  .  Food insecurity:    Worry: Not on file    Inability: Not on file  . Transportation needs:    Medical: Not on file    Non-medical: Not on file  Tobacco Use  . Smoking status: Never Smoker  . Smokeless tobacco: Never Used  Substance and Sexual Activity  . Alcohol use: No    Comment: once a month  . Drug use: No  . Sexual activity: Yes    Birth control/protection: Surgical    Comment: Hysterectomy  Lifestyle  . Physical activity:    Days per week: Not on file    Minutes per session: Not on file  . Stress: Not on file  Relationships  . Social connections:    Talks on phone: Not on file    Gets together: Not on file    Attends religious service: Not on file    Active member of club or organization: Not on file    Attends meetings of clubs or organizations: Not on file    Relationship status: Not on file  . Intimate partner violence:    Fear of current or ex partner: Not on file    Emotionally abused: Not on file    Physically abused: Not on file    Forced sexual activity: Not on file  Other Topics  Concern  . Not on file  Social History Narrative  . Not on file      OBJECTIVE:  Patient is a well appearing woman, appears stated age, in no apparent distress.  Her skin is without rash or lesion.  Her breathing is non labored.  Neuro is focally intact.  Her mood and behavior are normal.    LABORATORY DATA:  None for this visit   DIAGNOSTIC IMAGING:  Most recent mammogram:      ASSESSMENT AND PLAN:  Ms.. Arias is a pleasant 58 y.o. female with history of Stage IIA right breast invasive ductal carcinoma, ER-/PR-/HER2-, diagnosed in 2014, treated with neo-adjuvant chemotherapy, lumpectomy, and adjuvant radiation therapy.  She presents to the Survivorship Clinic for surveillance and routine follow-up.   1. History of breast cancer:  Ms. Marcantonio is currently clinically and radiographically without evidence of disease or recurrence of breast cancer. She will be due for mammogram in 10/2019; orders placed today.  I offered her graduation to her PCP for breast cancer surveillance, however, she would like to continue to return to our office on an annual basis for continued screening and survivorship discussions.  We will see her back in one year for an LTS follow up.  I encouraged her to call me with any questions or concerns before her next visit at the cancer center, and I would be happy to see her sooner, if needed.    2. Onychomycosis of bilateral great toenails:  This has been an issue since completing chemotherapy.  She has been using OTC topical treatments without great help.  I will send her information about Penlac which is a topical medication, Lamisil orally--which we will need to get baseline LFTs on her prior to initiating, and about onychomycosis in general in her AVS.  We also discussed podiatry referral if the condition fails to improve, as there are laser treatments available.  She will review the information and will let us know if she wants [place] to call anything in.    3. Bone  health:  Given Ms. Dimaio's age, history of breast cancer and family history of osteoporosis, she is at risk for bone demineralization.  She was given education on specific food and activities to promote bone health.  4. Cancer screening:  Due to Ms. Billups's history and her age/family history  she should receive screening for skin cancers, colon cancer, and gynecologic cancers. She was encouraged to follow-up with her PCP for appropriate cancer screenings.   5. Health maintenance and wellness promotion: Ms. Rowlands was encouraged to consume 5-7 servings of fruits and vegetables per day. She was also encouraged to engage in moderate to vigorous exercise for 30 minutes per day most days of the week. She was instructed to limit her alcohol consumption and continue to abstain from tobacco use. She was instructed to wear sunscreen of SPF 30 or higher while in the sun.      Follow up instructions:    -Return to cancer center in one year for LTS follow up  -Mammogram due in 10/2019 -Bone density in 10/2019   The patient was provided an opportunity to ask questions and all were answered. The patient agreed with the plan and demonstrated an understanding of the instructions.   The patient was advised to call back or seek an in-person evaluation if the symptoms worsen or if the condition fails to improve as anticipated.   I provided 30 minutes of face-to-face video visit time during this encounter, and > 50% was spent counseling as documented under my assessment & plan.   Gardenia Phlegm, [place] [phone]   Note: PRIMARY CARE PROVIDER [name], [place] [phone]

## 2019-05-26 ENCOUNTER — Inpatient Hospital Stay: Payer: Managed Care, Other (non HMO) | Attending: Adult Health | Admitting: Adult Health

## 2019-05-26 ENCOUNTER — Encounter: Payer: Self-pay | Admitting: Adult Health

## 2019-05-26 DIAGNOSIS — Z923 Personal history of irradiation: Secondary | ICD-10-CM | POA: Diagnosis not present

## 2019-05-26 DIAGNOSIS — Z171 Estrogen receptor negative status [ER-]: Secondary | ICD-10-CM | POA: Diagnosis not present

## 2019-05-26 DIAGNOSIS — Z1239 Encounter for other screening for malignant neoplasm of breast: Secondary | ICD-10-CM

## 2019-05-26 DIAGNOSIS — C50311 Malignant neoplasm of lower-inner quadrant of right female breast: Secondary | ICD-10-CM | POA: Diagnosis not present

## 2019-05-26 DIAGNOSIS — Z79899 Other long term (current) drug therapy: Secondary | ICD-10-CM

## 2019-05-26 DIAGNOSIS — E2839 Other primary ovarian failure: Secondary | ICD-10-CM

## 2019-05-26 DIAGNOSIS — Z7982 Long term (current) use of aspirin: Secondary | ICD-10-CM

## 2019-05-26 DIAGNOSIS — Z801 Family history of malignant neoplasm of trachea, bronchus and lung: Secondary | ICD-10-CM

## 2019-05-26 DIAGNOSIS — Z803 Family history of malignant neoplasm of breast: Secondary | ICD-10-CM

## 2019-05-26 NOTE — Patient Instructions (Addendum)
It was very nice to see you today!  Below are the recommendations for your nails, including Terbinafine orally (Lamisil), Penlac which is that antifungal polish (unsure what your copay/coverage is as I believe this is brand only), and then general information about fungal toenail infections.  If you decide to pursue, or at least see what copays are (which we will have to send in script to know that), please reach out and let us know.  If at any point you are ready to see podiatry, we are certainly happy to place that referral as well.      Terbinafine tablets What is this medicine? TERBINAFINE (TER bin a feen) is an antifungal medicine. It is used to treat certain kinds of fungal or yeast infections. This medicine may be used for other purposes; ask your health care provider or pharmacist if you have questions. COMMON BRAND NAME(S): Lamisil, Terbinex What should I tell my health care provider before I take this medicine? They need to know if you have any of these conditions: -drink alcoholic beverages -kidney disease -liver disease -an unusual or allergic reaction to terbinafine, other medicines, foods, dyes, or preservatives -pregnant or trying to get pregnant -breast-feeding How should I use this medicine? Take this medicine by mouth with a full glass of water. Follow the directions on the prescription label. You can take this medicine with food or on an empty stomach. Take your medicine at regular intervals. Do not take your medicine more often than directed. Do not skip doses or stop your medicine early even if you feel better. Do not stop taking except on your doctor's advice. Talk to your pediatrician regarding the use of this medicine in children. Special care may be needed. Overdosage: If you think you have taken too much of this medicine contact a poison control center or emergency room at once. NOTE: This medicine is only for you. Do not share this medicine with others. What if I miss  a dose? If you miss a dose, take it as soon as you can. If it is almost time for your next dose, take only that dose. Do not take double or extra doses. What may interact with this medicine? Do not take this medicine with any of the following medications: -thioridazine This medicine may also interact with the following medications: -beta-blockers -caffeine -cimetidine -cyclosporine -medicines for depression, anxiety, or psychotic disturbances -medicines for fungal infections like fluconazole and ketoconazole -medicines for irregular heartbeat like amiodarone, flecainide and propafenone -rifampin -warfarin This list may not describe all possible interactions. Give your health care provider a list of all the medicines, herbs, non-prescription drugs, or dietary supplements you use. Also tell them if you smoke, drink alcohol, or use illegal drugs. Some items may interact with your medicine. What should I watch for while using this medicine? Visit your doctor or health care provider regularly. Tell your doctor right away if you have nausea or vomiting, loss of appetite, stomach pain on your right upper side, yellow skin, dark urine, light stools, or are over tired. Some fungal infections need many weeks or months of treatment to cure. If you are taking this medicine for a long time, you will need to have important blood work done. What side effects may I notice from receiving this medicine? Side effects that you should report to your doctor or health care professional as soon as possible: -allergic reactions like skin rash or hives, swelling of the face, lips, or tongue -changes in vision -dark urine -fever  or infection -general ill feeling or flu-like symptoms -light-colored stools -loss of appetite, nausea -redness, blistering, peeling or loosening of the skin, including inside the mouth -right upper belly pain -unusually weak or tired -yellowing of the eyes or skin Side effects that  usually do not require medical attention (report to your doctor or health care professional if they continue or are bothersome): -changes in taste -diarrhea -hair loss -muscle or joint pain -stomach gas -stomach upset This list may not describe all possible side effects. Call your doctor for medical advice about side effects. You may report side effects to FDA at 1-800-FDA-1088. Where should I keep my medicine? Keep out of the reach of children. Store at room temperature below 25 degrees C (77 degrees F). Protect from light. Throw away any unused medicine after the expiration date. NOTE: This sheet is a summary. It may not cover all possible information. If you have questions about this medicine, talk to your doctor, pharmacist, or health care provider.  2019 Elsevier/Gold Standard (2008-02-26 16:28:07) Fungal Nail Infection A fungal nail infection is a common infection of the toenails or fingernails. This condition affects toenails more often than fingernails. It often affects the great, or big, toes. More than one nail may be infected. The condition can be passed from person to person (is contagious). What are the causes? This condition is caused by a fungus. Several types of fungi can cause the infection. These fungi are common in moist and warm areas. If your hands or feet come into contact with the fungus, it may get into a crack in your fingernail or toenail and cause the infection. What increases the risk? The following factors may make you more likely to develop this condition:  Being female.  Being of older age.  Living with someone who has the fungus.  Walking barefoot in areas where the fungus thrives, such as showers or locker rooms.  Wearing shoes and socks that cause your feet to sweat.  Having a nail injury or a recent nail surgery.  Having certain medical conditions, such as: ? Athlete's foot. ? Diabetes. ? Psoriasis. ? Poor circulation. ? A weak body defense  system (immune system). What are the signs or symptoms? Symptoms of this condition include:  A pale spot on the nail.  Thickening of the nail.  A nail that becomes yellow or brown.  A brittle or ragged nail edge.  A crumbling nail.  A nail that has lifted away from the nail bed. How is this diagnosed? This condition is diagnosed with a physical exam. Your health care provider may take a scraping or clipping from your nail to test for the fungus. How is this treated? Treatment is not needed for mild infections. If you have significant nail changes, treatment may include:  Antifungal medicines taken by mouth (orally). You may need to take the medicine for several weeks or several months, and you may not see the results for a long time. These medicines can cause side effects. Ask your health care provider what problems to watch for.  Antifungal nail polish or nail cream. These may be used along with oral antifungal medicines.  Laser treatment of the nail.  Surgery to remove the nail. This may be needed for the most severe infections. It can take a long time, usually up to a year, for the infection to go away. The infection may also come back. Follow these instructions at home: Medicines  Take or apply over-the-counter and prescription medicines only as  told by your health care provider.  Ask your health care provider about using over-the-counter mentholated ointment on your nails. Nail care  Trim your nails often.  Wash and dry your hands and feet every day.  Keep your feet dry: ? Wear absorbent socks, and change your socks frequently. ? Wear shoes that allow air to circulate, such as sandals or canvas tennis shoes. Throw out old shoes.  Do not use artificial nails.  If you go to a nail salon, make sure you choose one that uses clean instruments.  Use antifungal foot powder on your feet and in your shoes. General instructions  Do not share personal items, such as  towels or nail clippers.  Do not walk barefoot in shower rooms or locker rooms.  Wear rubber gloves if you are working with your hands in wet areas.  Keep all follow-up visits as told by your health care provider. This is important. Contact a health care provider if: Your infection is not getting better or it is getting worse after several months. Summary  A fungal nail infection is a common infection of the toenails or fingernails.  Treatment is not needed for mild infections. If you have significant nail changes, treatment may include taking medicine orally and applying medicine to your nails.  It can take a long time, usually up to a year, for the infection to go away. The infection may also come back.  Take or apply over-the-counter and prescription medicines only as told by your health care provider.  Follow instructions for taking care of your nails to help prevent infection from coming back or spreading. This information is not intended to replace advice given to you by your health care provider. Make sure you discuss any questions you have with your health care provider. Document Released: 12/13/2000 Document Revised: 05/22/2018 Document Reviewed: 05/22/2018 Elsevier Interactive Patient Education  2019 [employer] Ciclopirox nail solution What is this medicine? CICLOPIROX (sye kloe PEER ox) NAIL SOLUTION is an antifungal medicine. It used to treat fungal infections of the nails. This medicine may be used for other purposes; ask your health care provider or pharmacist if you have questions. COMMON BRAND NAME(S): CNL8, Penlac What should I tell my health care provider before I take this medicine? They need to know if you have any of these conditions: -diabetes mellitus -history of seizures -HIV infection -immune system problems or organ transplant -large areas of burned or damaged skin -peripheral vascular disease or poor circulation -taking corticosteroid medication  (including steroid inhalers, cream, or lotion) -an unusual or allergic reaction to ciclopirox, isopropyl alcohol, other medicines, foods, dyes, or preservatives -pregnant or trying to get pregnant -breast-feeding How should I use this medicine? This medicine is for external use only. Follow the directions that come with this medicine exactly. Wash and dry your hands before use. Avoid contact with the eyes, mouth or nose. If you do get this medicine in your eyes, rinse out with plenty of cool tap water. Contact your doctor or health care professional if eye irritation occurs. Use at regular intervals. Do not use your medicine more often than directed. Finish the full course prescribed by your doctor or health care professional even if you think you are better. Do not stop using except on your doctor's advice. Talk to your pediatrician regarding the use of this medicine in children. While this medicine may be prescribed for children as young as 12 years for selected conditions, precautions do apply. Overdosage: If you think you  have taken too much of this medicine contact a poison control center or emergency room at once. NOTE: This medicine is only for you. Do not share this medicine with others. What if I miss a dose? If you miss a dose, use it as soon as you can. If it is almost time for your next dose, use only that dose. Do not use double or extra doses. What may interact with this medicine? Interactions are not expected. Do not use any other skin products without telling your doctor or health care professional. This list may not describe all possible interactions. Give your health care provider a list of all the medicines, herbs, non-prescription drugs, or dietary supplements you use. Also tell them if you smoke, drink alcohol, or use illegal drugs. Some items may interact with your medicine. What should I watch for while using this medicine? Tell your doctor or health care professional if your  symptoms get worse. Four to six months of treatment may be needed for the nail(s) to improve. Some people may not achieve a complete cure or clearing of the nails by this time. Tell your doctor or health care professional if you develop sores or blisters that do not heal properly. If your nail infection returns after stopping using this product, contact your doctor or health care professional. What side effects may I notice from receiving this medicine? Side effects that you should report to your doctor or health care professional as soon as possible: -allergic reactions like skin rash, itching or hives, swelling of the face, lips, or tongue -severe irritation, redness, burning, blistering, peeling, swelling, oozing Side effects that usually do not require medical attention (report to your doctor or health care professional if they continue or are bothersome): -mild reddening of the skin -nail discoloration -temporary burning or mild stinging at the site of application This list may not describe all possible side effects. Call your doctor for medical advice about side effects. You may report side effects to FDA at 1-800-FDA-1088. Where should I keep my medicine? Keep out of the reach of children. Store at room temperature between 15 and 30 degrees C (59 and 86 degrees F). Do not freeze. Protect from light by storing the bottle in the carton after every use. This medicine is flammable. Keep away from heat and flame. Throw away any unused medicine after the expiration date. NOTE: This sheet is a summary. It may not cover all possible information. If you have questions about this medicine, talk to your doctor, pharmacist, or health care provider.  2019 Elsevier/Gold Standard (2008-03-21 16:49:20)

## 2019-05-27 ENCOUNTER — Telehealth: Payer: Self-pay | Admitting: Adult Health

## 2019-05-27 NOTE — Telephone Encounter (Signed)
Called regarding schedule °

## 2019-05-28 ENCOUNTER — Encounter: Payer: Managed Care, Other (non HMO) | Admitting: Adult Health

## 2020-05-26 ENCOUNTER — Telehealth: Payer: Self-pay

## 2020-05-26 ENCOUNTER — Inpatient Hospital Stay: Payer: Managed Care, Other (non HMO) | Admitting: Adult Health

## 2020-05-26 NOTE — Telephone Encounter (Signed)
called pt to check on her since I noticed that she was late for her 11:00am appointment with Mendel Ryder NP. Patient stated that she was not aware of the appointment today and asked me to reschedule it. Schedule message sent and Mendel Ryder NP made aware.

## 2020-05-26 NOTE — Telephone Encounter (Signed)
TC to [facility] per Wilber Bihari NP to get her recent mammogram faxed over. Called [facility] [phone]) who stated that they would fax over her latest mammogram to fax#[phone]. However, the last one was from 2019 because they have been trying to get in touch with patient but have been unsuccessful in reaching her to schedule appointment. Mendel Ryder NP made aware.

## 2020-06-30 ENCOUNTER — Inpatient Hospital Stay: Payer: Managed Care, Other (non HMO) | Attending: Adult Health | Admitting: Adult Health

## 2020-06-30 ENCOUNTER — Other Ambulatory Visit: Payer: Self-pay

## 2020-06-30 ENCOUNTER — Encounter: Payer: Self-pay | Admitting: Adult Health

## 2020-06-30 VITALS — BP 103/70 | HR 79 | Temp 98.7°F | Resp 18 | Ht 66.0 in | Wt 174.4 lb

## 2020-06-30 DIAGNOSIS — Z853 Personal history of malignant neoplasm of breast: Secondary | ICD-10-CM | POA: Diagnosis present

## 2020-06-30 DIAGNOSIS — J45909 Unspecified asthma, uncomplicated: Secondary | ICD-10-CM | POA: Diagnosis not present

## 2020-06-30 DIAGNOSIS — Z79899 Other long term (current) drug therapy: Secondary | ICD-10-CM | POA: Insufficient documentation

## 2020-06-30 DIAGNOSIS — Z9221 Personal history of antineoplastic chemotherapy: Secondary | ICD-10-CM | POA: Insufficient documentation

## 2020-06-30 DIAGNOSIS — Z803 Family history of malignant neoplasm of breast: Secondary | ICD-10-CM | POA: Insufficient documentation

## 2020-06-30 DIAGNOSIS — Z8262 Family history of osteoporosis: Secondary | ICD-10-CM | POA: Diagnosis not present

## 2020-06-30 DIAGNOSIS — C50311 Malignant neoplasm of lower-inner quadrant of right female breast: Secondary | ICD-10-CM | POA: Diagnosis not present

## 2020-06-30 DIAGNOSIS — Z171 Estrogen receptor negative status [ER-]: Secondary | ICD-10-CM

## 2020-06-30 DIAGNOSIS — Z923 Personal history of irradiation: Secondary | ICD-10-CM | POA: Diagnosis not present

## 2020-06-30 DIAGNOSIS — Z8249 Family history of ischemic heart disease and other diseases of the circulatory system: Secondary | ICD-10-CM | POA: Diagnosis not present

## 2020-06-30 DIAGNOSIS — Z801 Family history of malignant neoplasm of trachea, bronchus and lung: Secondary | ICD-10-CM | POA: Insufficient documentation

## 2020-06-30 NOTE — Progress Notes (Signed)
SURVIVORSHIP VISIT:    REASON FOR VISIT:  Routine follow-up for history of breast cancer.   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  The Breast/Ovarian gene panel offered by [facility] includes sequencing and rearrangement analysis for the following 21 genes:  ATM, BARD1, BRCA1, BRCA2, BRIP1, CDH1, CHEK2, EPCAM, FANCC, MLH1, MSH2, MSH6, NBN, PALB2, PMS2, PTEN, RAD51C, RAD51D, STK11, TP53, and XRCC2.     UPDATE: BARD1 c.1977A>G VUS was reclassified from a VUS to a likely benign variant based on a combination of sources, e.g. Internal data, published literature, population databases and in silico models.      INTERVAL HISTORY:  Natalie Arias [facility] for routine follow-up for her history of breast cancer.  Overall, she reports feeling quite well.  Natalie Arias has recently moved to [facility], [place] to retire.  She is enjoying herself down there and getting acclimated to her surroundings.  She didn't have her mammogram here as ordered, instead she says she had a scan she cannot recall the name of in [facility].  She is exercising regularly and is getting in with a new PCP in [place].     REVIEW OF SYSTEMS:  Review of Systems  Constitutional: Negative for appetite change, chills, fatigue, fever and unexpected weight change.  HENT:   Negative for hearing loss, lump/mass, nosebleeds, sore throat and voice change.    Eyes: Negative for eye problems and icterus.  Respiratory: Negative for chest tightness, cough and shortness of breath.   Cardiovascular: Negative for chest pain, leg swelling and palpitations.  Gastrointestinal: Negative for abdominal distention, abdominal pain, constipation, diarrhea, nausea and vomiting.  Endocrine: Negative for hot flashes.  Genitourinary: Negative for difficulty urinating.   Musculoskeletal: Negative for arthralgias.  Skin: Negative for itching and rash.  Neurological: Negative for dizziness, extremity weakness, headaches and numbness.  Hematological: Negative for adenopathy. Does not bruise/bleed easily.  Psychiatric/Behavioral: Negative for depression. The patient is not nervous/anxious.   Breast: Denies any new nodularity, masses, tenderness, nipple changes, or nipple discharge.       PAST MEDICAL/SURGICAL HISTORY:  Past Medical History:  Diagnosis Date  . Allergy   . Asthma 20's   allergic asthema  . Breast cancer ([place]) dx'd 10/01/2013   right; triplen negative  . PONV (postoperative nausea and vomiting)   . S/P radiation therapy 4-12 wks ago 05/09/14-06/23/14   rt breast 60.4Gy  . Seasonal allergies    Past Surgical History:  Procedure Laterality Date  . ABDOMINAL HYSTERECTOMY  1992   1 ovary left  . LAPAROSCOPIC ENDOMETRIOSIS FULGURATION  1991  . PARTIAL MASTECTOMY WITH NEEDLE LOCALIZATION AND AXILLARY SENTINEL LYMPH NODE BX Right 04/08/2014   Procedure: PARTIAL MASTECTOMY WITH NEEDLE LOCALIZATION AND AXILLARY SENTINEL LYMPH NODE BIOPSY;  Surgeon: Adin Hector, MD;  Location: [facility];  Service: General;  Laterality: Right;  . PORT-A-CATH REMOVAL Left 07/26/2014   Procedure: REMOVAL PORT-A-CATH;  Surgeon: Adin Hector, MD;  Location: [place] [facility];  Service: General;  Laterality: Left;  . PORTACATH PLACEMENT N/A 10/18/2013   Procedure: INSERTION PORT-A-CATH WITH ULTRASOUND;  Surgeon: Adin Hector, MD;  Location:  [facility];  Service: General;  Laterality: N/A;     ALLERGIES:  Allergies  Allergen Reactions  . Penicillins Hives     CURRENT MEDICATIONS:  Outpatient Encounter Medications as of 06/30/2020  Medication Sig  . Biotin 10000 MCG TABS Take 1 tablet by mouth daily.  Marland Kitchen ibuprofen (ADVIL,MOTRIN) 200 MG tablet Take 400 mg by mouth every 6 (six) hours as needed for moderate pain.   No facility-administered encounter medications on file as of 06/30/2020.     ONCOLOGIC FAMILY HISTORY:  Family History  Problem Relation Age of Onset  . Lung cancer Maternal Uncle        smoker  . Breast cancer Paternal Aunt        dx in her late 51s; maternal half sister to patient's father  . Breast cancer Maternal Grandmother 71  . Lung cancer Maternal Uncle        smoker  . Hypertension Father   . Heart disease Father   . Parkinsonism Father   . Skin cancer Father   . Hypercholesterolemia Mother   . Gallstones Mother   . Osteoporosis Mother   . Healthy Brother   . COPD Maternal Grandfather   . Lung cancer Cousin        smoker; paternal cousin    GENETIC COUNSELING/TESTING: See above  SOCIAL HISTORY:  Social History   Socioeconomic History  . Marital status: Married    Spouse name: Not on file  . Number of children: 2  . Years of education: Not on file  . Highest education level: Not on file  Occupational History    Employer: [employer]  Tobacco Use  . Smoking status: Never Smoker  . Smokeless tobacco: Never Used  Substance and Sexual Activity  . Alcohol use: No    Comment: once a month  . Drug use: No  . Sexual activity: Yes    Birth control/protection: Surgical    Comment: Hysterectomy  Other Topics Concern  . Not on file  Social History Narrative  . Not on file   Social Determinants of Health   Financial Resource Strain:   . Difficulty of Paying Living Expenses:   Food Insecurity:   . Worried About Charity fundraiser in the Last Year:   . Arboriculturist in the Last Year:    Transportation Needs:   . Film/video editor (Medical):   Marland Kitchen Lack of Transportation (Non-Medical):   Physical Activity:   . Days of Exercise per Week:   . Minutes of Exercise per Session:   Stress:   . Feeling of Stress :   Social Connections:   . Frequency of Communication with Friends and Family:   . Frequency of Social Gatherings with Friends and Family:   . Attends Religious Services:   . Active Member of Clubs or Organizations:   . Attends Archivist Meetings:   Marland Kitchen Marital Status:   Intimate Partner Violence:   . Fear of Current or Ex-Partner:   . Emotionally Abused:   Marland Kitchen Physically Abused:   . Sexually Abused:       OBJECTIVE:  BP 103/70 (BP Location: Left Arm, Patient Position: Sitting)   Pulse 79   Temp 98.7 F (37.1 C) (Tympanic)   Resp 18   Ht '5\' 6"'  (1.676  m)   Wt 174 lb 6.4 oz (79.1 kg)   SpO2 100%   BMI 28.15 kg/m  GENERAL: Patient is a well appearing female in no acute distress HEENT:  Sclerae anicteric.  Oropharynx clear and moist. No ulcerations or evidence of oropharyngeal candidiasis. Neck is supple.  NODES:  No cervical, supraclavicular, or axillary lymphadenopathy palpated.  BREAST EXAM:  No sign of local recurrence, or breast cancer, benign breast exam LUNGS:  Clear to auscultation bilaterally.  No wheezes or rhonchi. HEART:  Regular rate and rhythm. No murmur appreciated. ABDOMEN:  Soft, nontender.  Positive, normoactive bowel sounds. No organomegaly palpated. MSK:  No focal spinal tenderness to palpation. Full range of motion bilaterally in the upper extremities. EXTREMITIES:  No peripheral edema.   SKIN:  Clear with no obvious rashes or skin changes. No nail dyscrasia. NEURO:  Nonfocal. Well oriented.  Appropriate affect.   LABORATORY DATA:  None for this visit   DIAGNOSTIC IMAGING:  Most recent mammogram:      ASSESSMENT AND PLAN:  Natalie Arias is a pleasant 59 y.o. female with history of Stage IIA right breast invasive  ductal carcinoma, ER-/PR-/HER2-, diagnosed in 2014, treated with neo-adjuvant chemotherapy, lumpectomy, and adjuvant radiation therapy.  [facility] for surveillance and routine follow-up.   1. History of breast cancer:  Natalie Arias is currently clinically and radiographically without evidence of disease or recurrence of breast cancer. I am unsure what scan she had, but let her know that breast mammograms are recommended annually for screening.  She is getting in with a new PCP in [facility].  She is considering returning here annually for her visits. I am happy to accommodate this for her if this is her wish.  I also am happy for her to f/u with her PCP.  She will call and let me know, but for now, I am not making any future appointments.    2. Bone health:  Given Natalie Arias's age, history of breast cancer and family history of osteoporosis, she is at risk for bone demineralization.   She was given education on specific food and activities to promote bone health.  3. Cancer screening:  Due to Natalie Arias's history and her age/family history  she should receive screening for skin cancers, colon cancer, and gynecologic cancers. She was encouraged to follow-up with her PCP for appropriate cancer screenings.   4. Health maintenance and wellness promotion: Natalie Arias was encouraged to consume 5-7 servings of fruits and vegetables per day. She was also encouraged to engage in moderate to vigorous exercise for 30 minutes per day most days of the week. She was instructed to limit her alcohol consumption and continue to abstain from tobacco use. She was instructed to wear sunscreen of SPF 30 or higher while in the sun.      Follow up instructions:    -Return to cancer center PRN  She knows to call for any questions that may arise between now and her next appointment.  We are happy to see her sooner if needed.  Total encounter time: 20 minutes*  Gardenia Phlegm, [place] [phone]  *Total Encounter Time as defined by the Centers for Medicare and Medicaid Services includes, in addition to the face-to-face time of a patient visit (documented in the note above) non-face-to-face time: obtaining and reviewing outside history, ordering and reviewing medications, tests or procedures, care coordination (communications with other health care professionals or caregivers) and  documentation in the medical record.    Note: PRIMARY CARE PROVIDER [name], [place] [phone]

## 2020-07-04 ENCOUNTER — Telehealth: Payer: Self-pay | Admitting: Adult Health

## 2020-07-04 NOTE — Telephone Encounter (Signed)
No 7/2 los. No changes made to pt's schedule.
